# Patient Record
Sex: Female | Born: 1950 | Race: Black or African American | Hispanic: No | Smoking: Never smoker
Health system: Southern US, Community
[De-identification: ages and names within clinical notes are randomized; demographics above are authoritative.]

## PROBLEM LIST (undated history)

## (undated) DIAGNOSIS — N83202 Unspecified ovarian cyst, left side: Secondary | ICD-10-CM

## (undated) DIAGNOSIS — D649 Anemia, unspecified: Secondary | ICD-10-CM

## (undated) DIAGNOSIS — K8301 Primary sclerosing cholangitis: Secondary | ICD-10-CM

## (undated) DIAGNOSIS — N83201 Unspecified ovarian cyst, right side: Secondary | ICD-10-CM

## (undated) DIAGNOSIS — C801 Malignant (primary) neoplasm, unspecified: Secondary | ICD-10-CM

## (undated) DIAGNOSIS — G709 Myoneural disorder, unspecified: Secondary | ICD-10-CM

## (undated) DIAGNOSIS — E785 Hyperlipidemia, unspecified: Secondary | ICD-10-CM

## (undated) DIAGNOSIS — K529 Noninfective gastroenteritis and colitis, unspecified: Secondary | ICD-10-CM

## (undated) DIAGNOSIS — K5090 Crohn's disease, unspecified, without complications: Secondary | ICD-10-CM

## (undated) DIAGNOSIS — I10 Essential (primary) hypertension: Secondary | ICD-10-CM

## (undated) DIAGNOSIS — K51919 Ulcerative colitis, unspecified with unspecified complications: Secondary | ICD-10-CM

## (undated) HISTORY — PX: ERCP W/ SPHINCTEROTOMY AND BALLOON DILATION: SHX1524

## (undated) HISTORY — PX: CHOLECYSTECTOMY: SHX55

## (undated) HISTORY — DX: Unspecified ovarian cyst, right side: N83.202

## (undated) HISTORY — DX: Unspecified ovarian cyst, right side: N83.201

## (undated) HISTORY — PX: ABDOMINAL HYSTERECTOMY: SHX81

## (undated) HISTORY — DX: Noninfective gastroenteritis and colitis, unspecified: K52.9

---

## 2009-12-20 ENCOUNTER — Ambulatory Visit: Payer: Self-pay | Admitting: Internal Medicine

## 2010-01-06 ENCOUNTER — Ambulatory Visit: Payer: Self-pay | Admitting: Internal Medicine

## 2010-01-20 ENCOUNTER — Ambulatory Visit: Payer: Self-pay | Admitting: Internal Medicine

## 2010-02-17 ENCOUNTER — Ambulatory Visit: Payer: Self-pay | Admitting: Internal Medicine

## 2010-04-02 ENCOUNTER — Ambulatory Visit: Payer: Self-pay | Admitting: Internal Medicine

## 2011-08-02 ENCOUNTER — Ambulatory Visit: Payer: Self-pay | Admitting: Internal Medicine

## 2012-08-02 ENCOUNTER — Ambulatory Visit: Payer: Self-pay | Admitting: Internal Medicine

## 2012-08-02 IMAGING — MG MM CAD SCREENING MAMMO
1 series · 4 of 4 positions shown · non-contrast
Comparison: none

REASON FOR EXAM: scr mammo no order
COMMENTS:

[R CC · right · 4 of 4 slices shown]
[im 1/4]
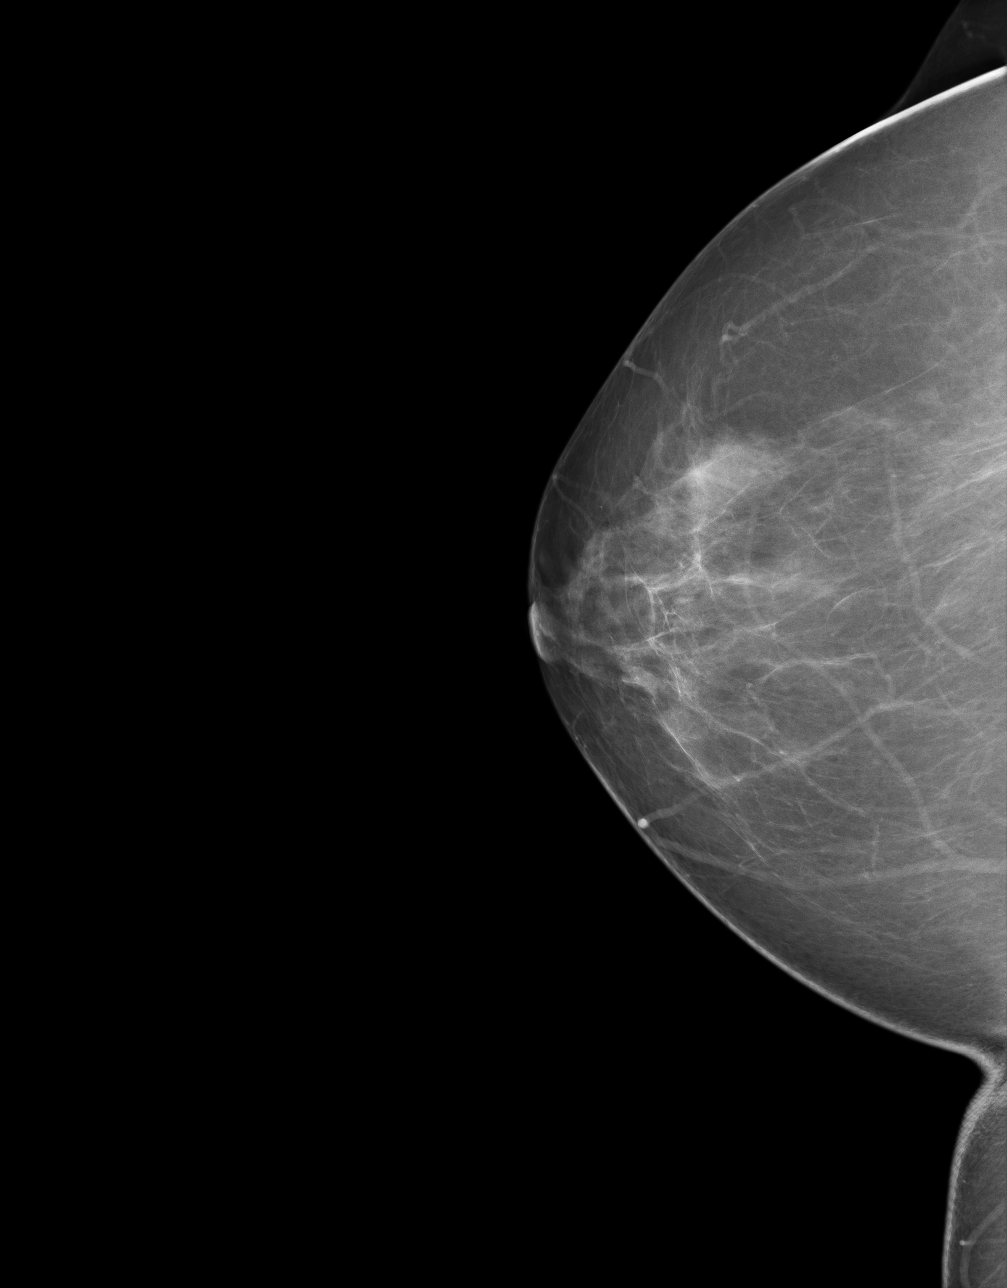
[im 2/4]
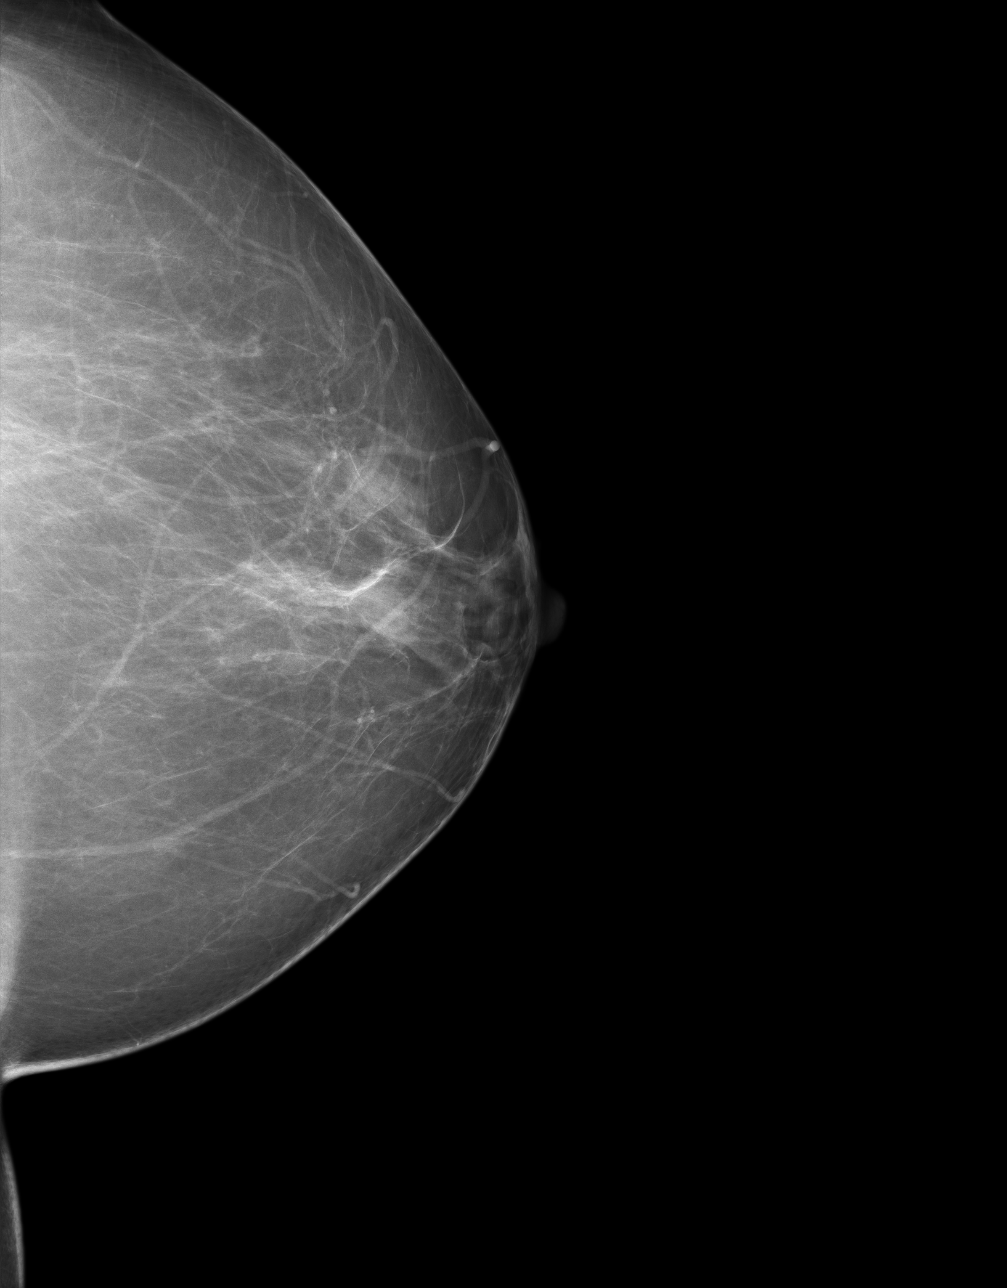
[im 3/4]
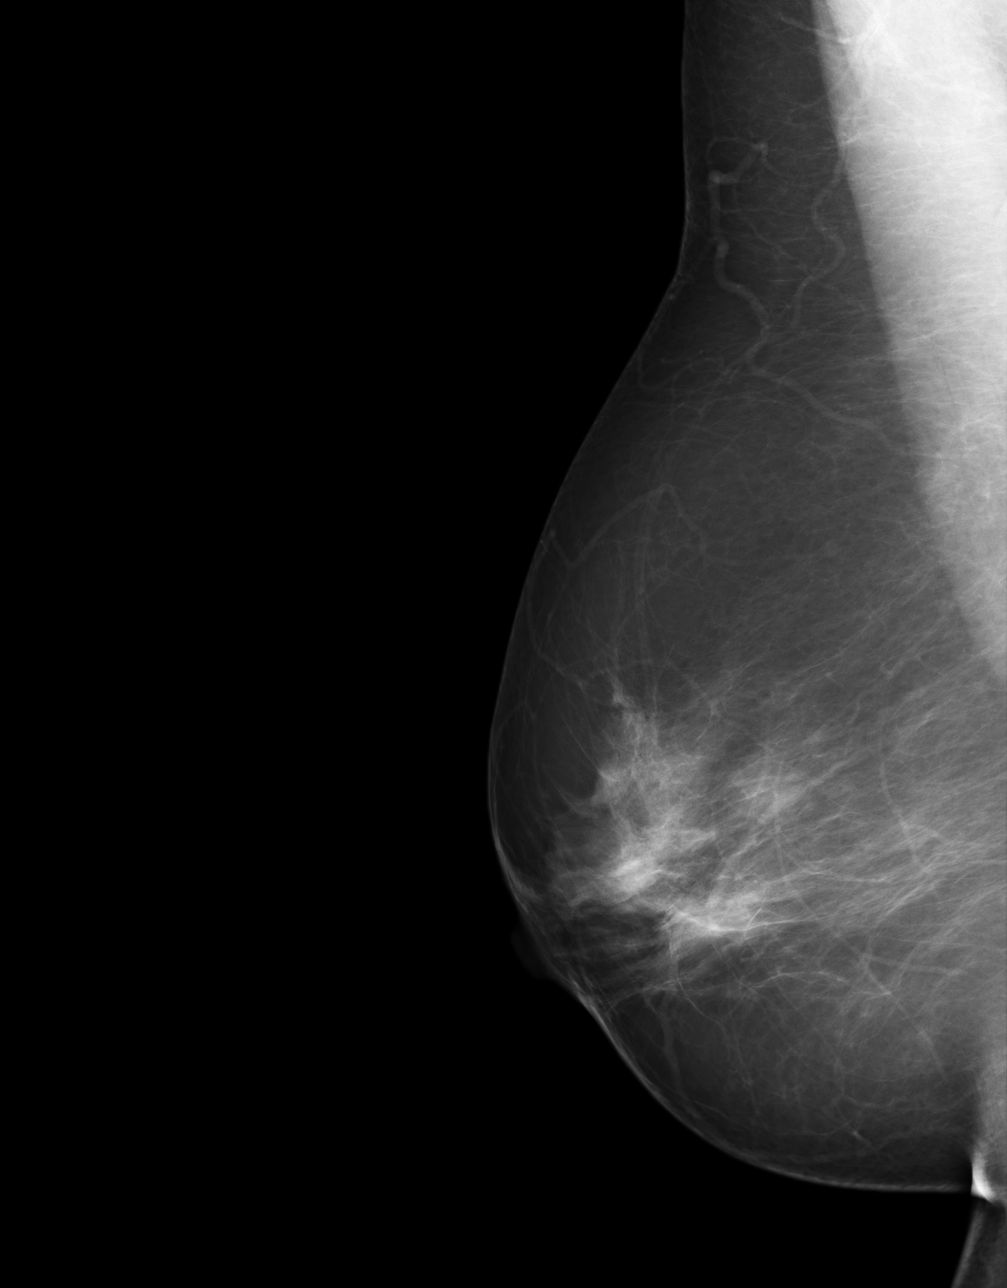
[im 4/4]
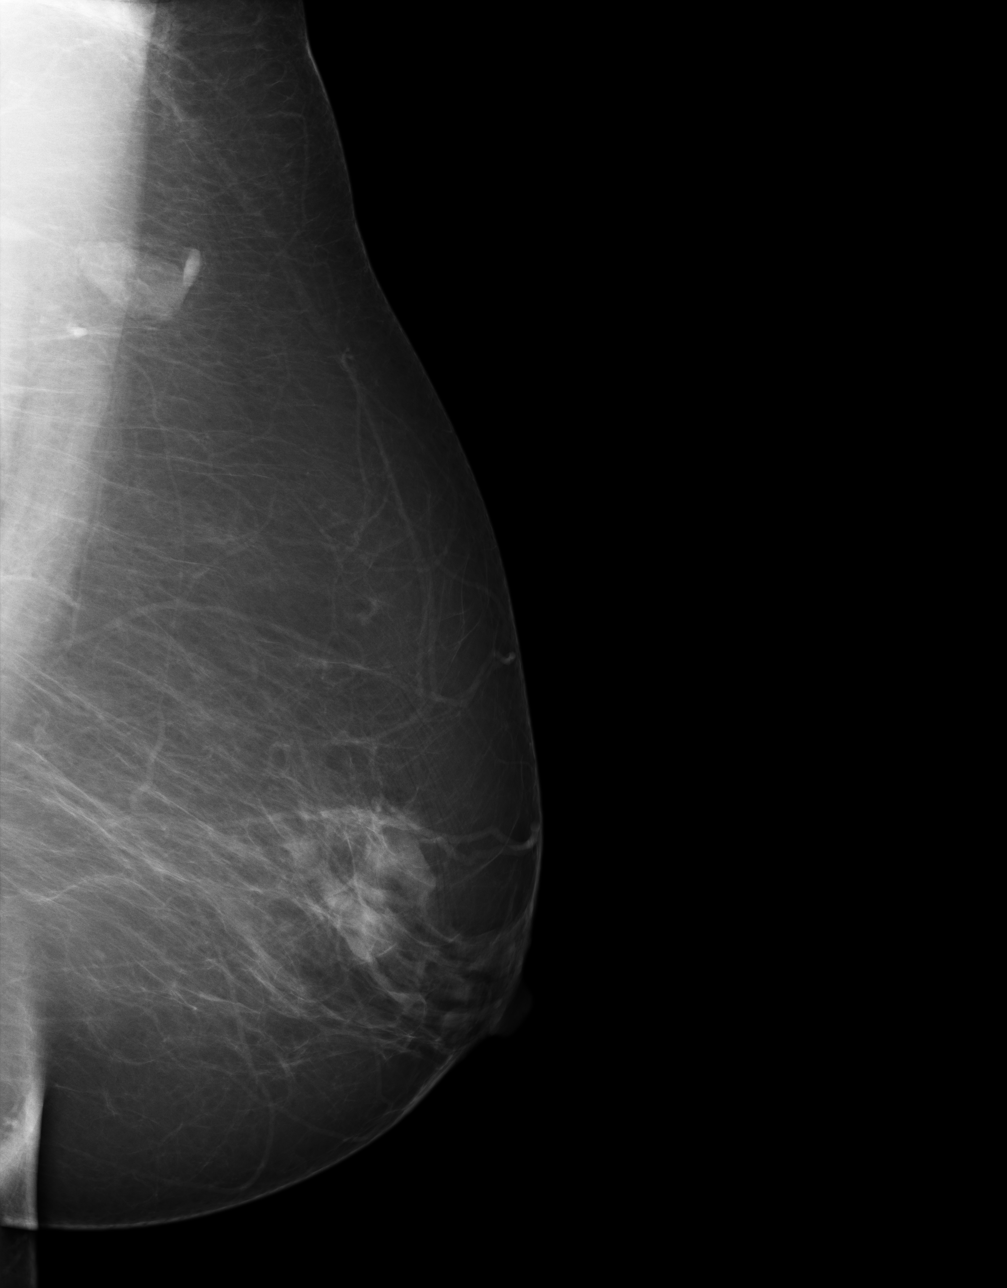

[4 of 4 positions shown; findings below may reference images not displayed]

PROCEDURE:     MAM - MAM DGTL SCRN MAM NO ORDER W/CAD  - [DATE]  [DATE]

RESULT:     There is no known family history of breast cancer. There is no
history of breast surgery. Comparison is made to previous digital
mammographic images [DATE] as well as [DATE].

The breasts exhibit a mild parenchymal density. There is no dominant mass,
developing density or malignant calcification. There is no architectural
distortion. The appearance is stable.
IMPRESSION: 1.     Stable, benign-appearing bilateral mammogram.
2.     BI-RADS:  Category 2- Benign Finding.
3.     Please continue to encourage annual mammographic followup.

Thank you for the opportunity to contribute to the care of your patient.

A negative mammogram report does not preclude biopsy or other evaluation of
a clinically palpable or otherwise suspicious mass or lesion.  Breast cancer
may not be detected by mammography in up to 10% of cases.

[REDACTED]

## 2013-08-03 ENCOUNTER — Ambulatory Visit: Payer: Self-pay | Admitting: Internal Medicine

## 2013-08-03 IMAGING — MG MM CAD SCREENING MAMMO
1 series · 4 of 4 positions shown · non-contrast
Comparison: none

REASON FOR EXAM: SCR MAMMO NO ORDER
COMMENTS:

[R CC · right · 4 of 4 slices shown]
[im 1/4]
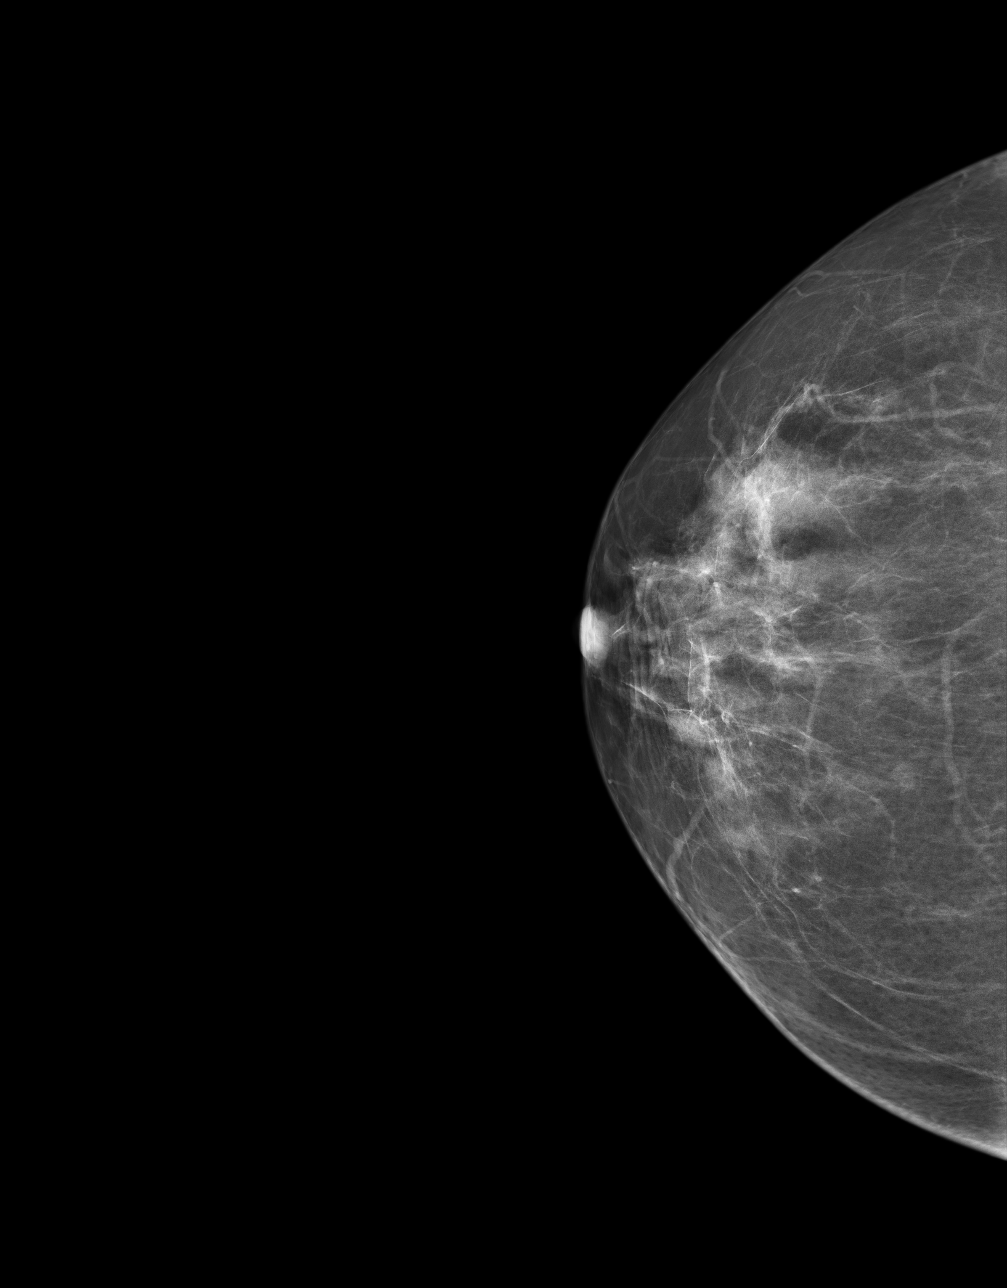
[im 2/4]
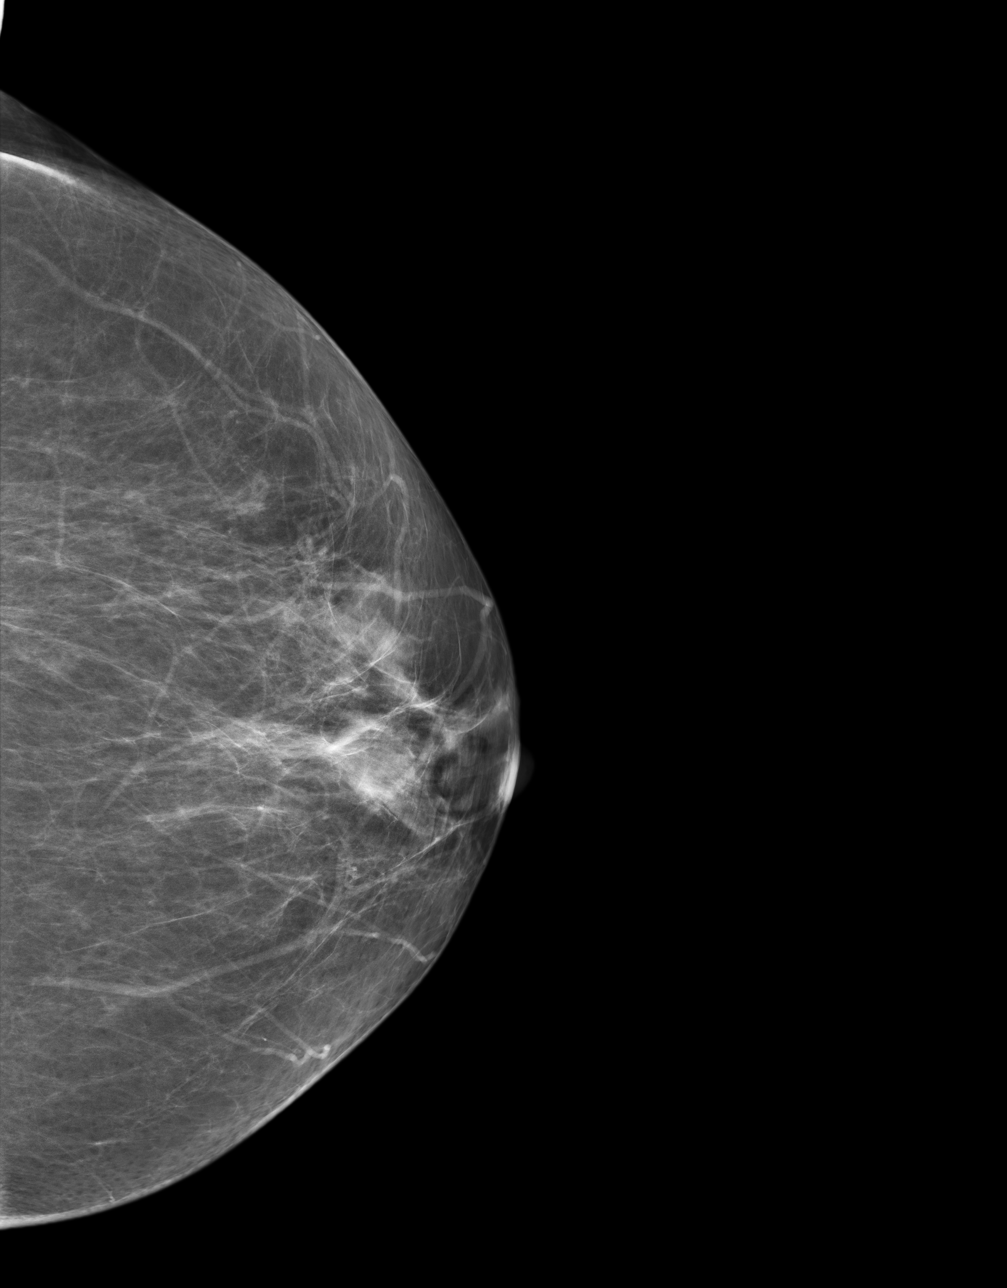
[im 3/4]
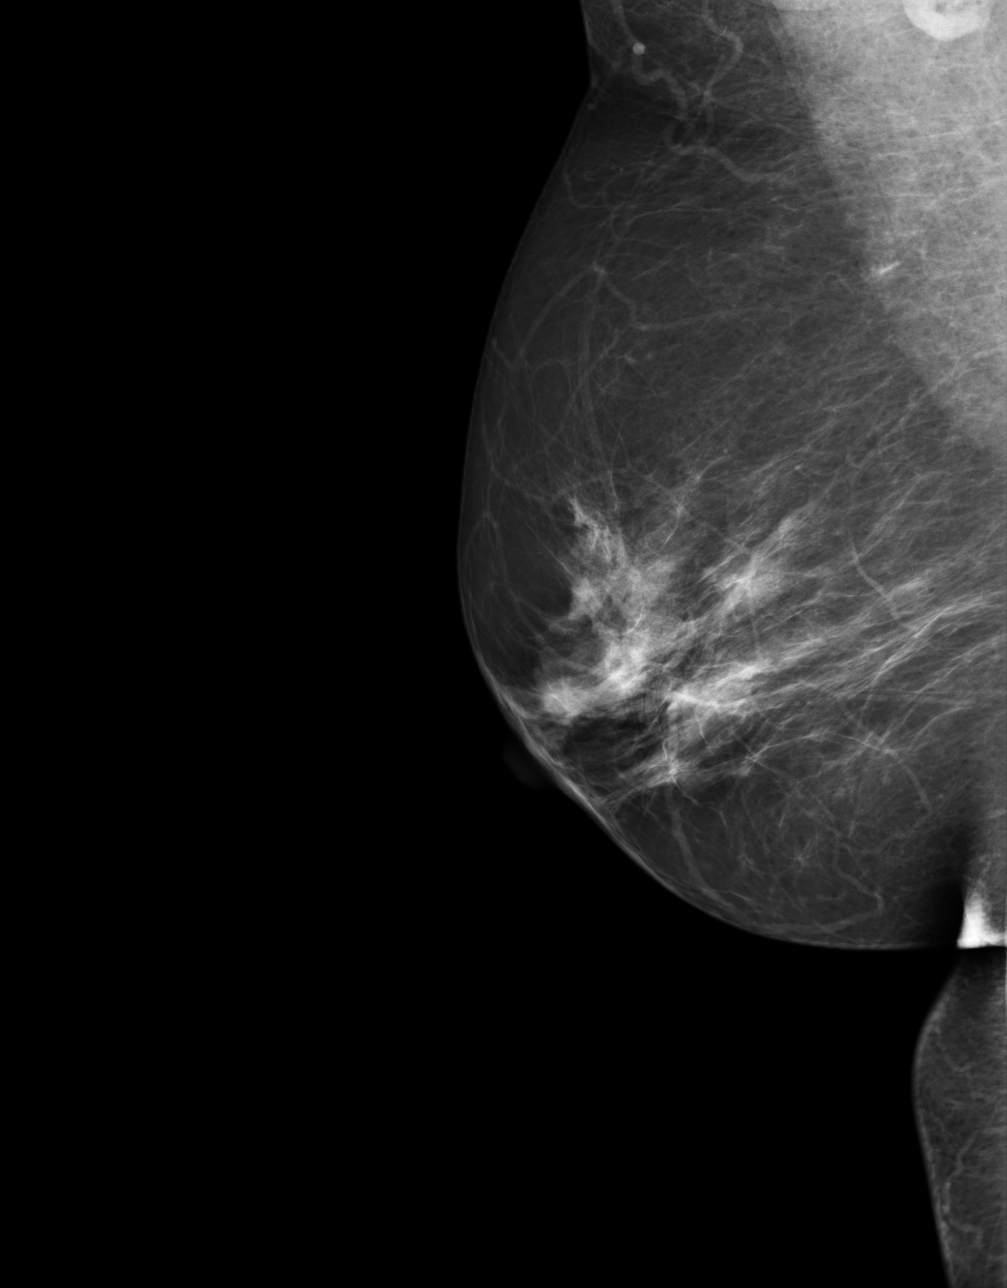
[im 4/4]
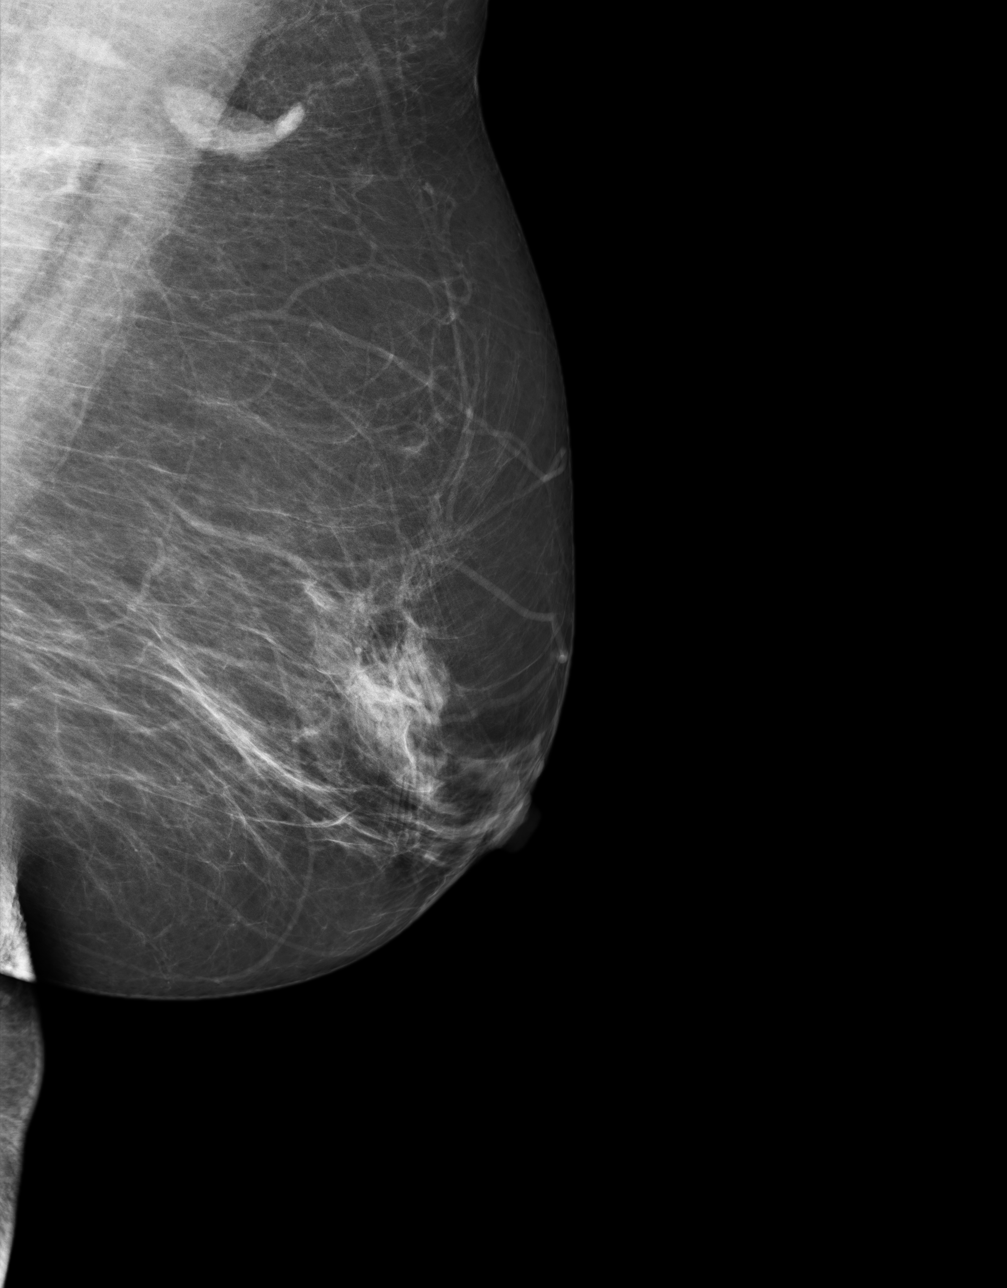

[4 of 4 positions shown; findings below may reference images not displayed]

PROCEDURE:     MAM - MAM DGTL SCRN MAM NO ORDER W/CAD  - [DATE]  [DATE]

RESULT:     Comparison is made to previous digital studies [DATE],[DATE], and [DATE].

The breasts exhibit a scattered fibroglandular pattern with evidence of
ongoing involution. Benign-appearing lymph nodes are present in the axillary
regions. There is no dominant mass. There are no malignant appearing
groupings of microcalcification. There are no areas of new architectural
distortion.
IMPRESSION: There are no findings suspicious for malignancy.

BI-RADS 2: Benign findings.

Recommendation: Please continue to encourage yearly mammographic followup.

BREAST COMPOSITION: The breast composition is SCATTERED FIBROGLANDULAR
TISSUE (glandular tissue is 25-50%)

A NEGATIVE MAMMOGRAM REPORT DOES NOT PRECLUDE BIOPSY OR OTHER EVALUATION OF
A CLINICALLY PALPABLE OR OTHERWISE SUSPICIOUS MASS OR LESION. BREAST CANCER
MAY NOT BE DETECTED BY MAMMOGRAPHY IN UP TO 10% OF CASES.

Dictation site:1

## 2014-08-01 ENCOUNTER — Ambulatory Visit: Payer: Self-pay | Admitting: Internal Medicine

## 2014-08-01 IMAGING — MG MM DIGITAL SCREENING BILAT W/ CAD
1 series · 4 of 4 positions shown · non-contrast
Comparison: Previous exam(s).

CLINICAL DATA: Screening.

EXAM:
DIGITAL SCREENING BILATERAL MAMMOGRAM WITH CAD

[R CC · right · 4 of 4 slices shown]
[im 1/4]
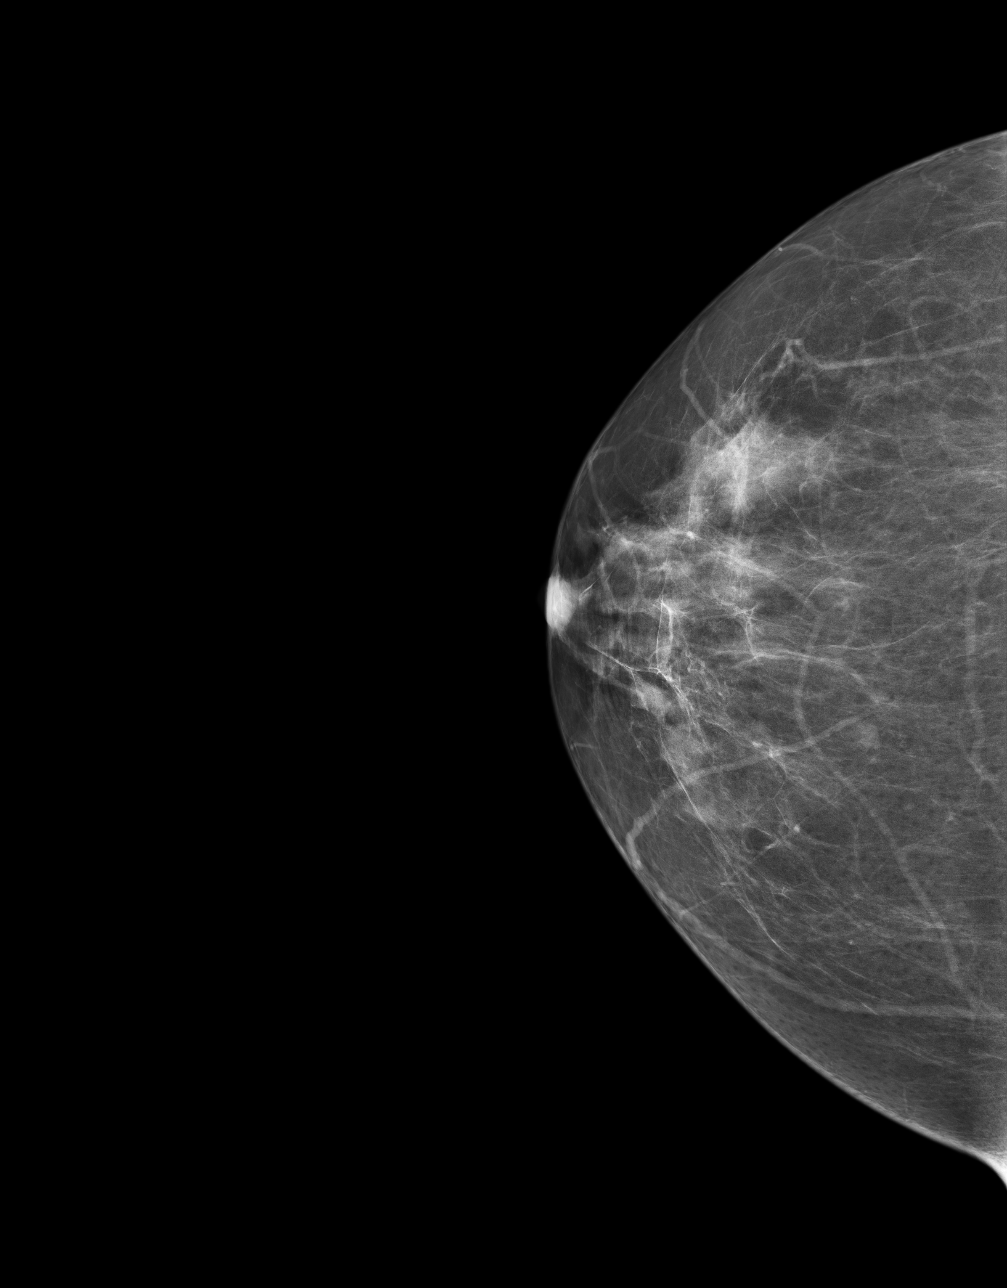
[im 2/4]
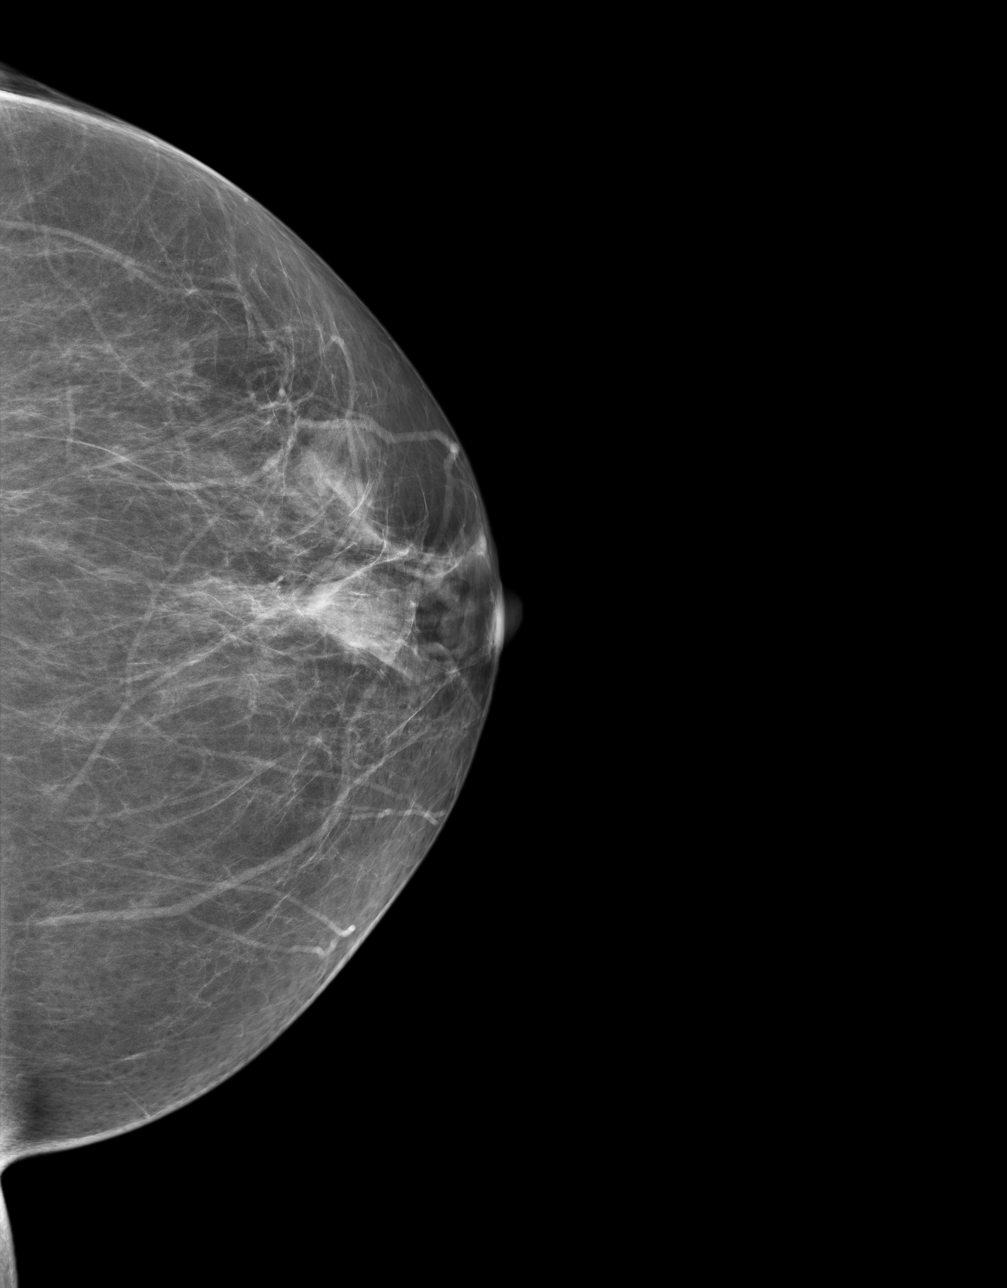
[im 3/4]
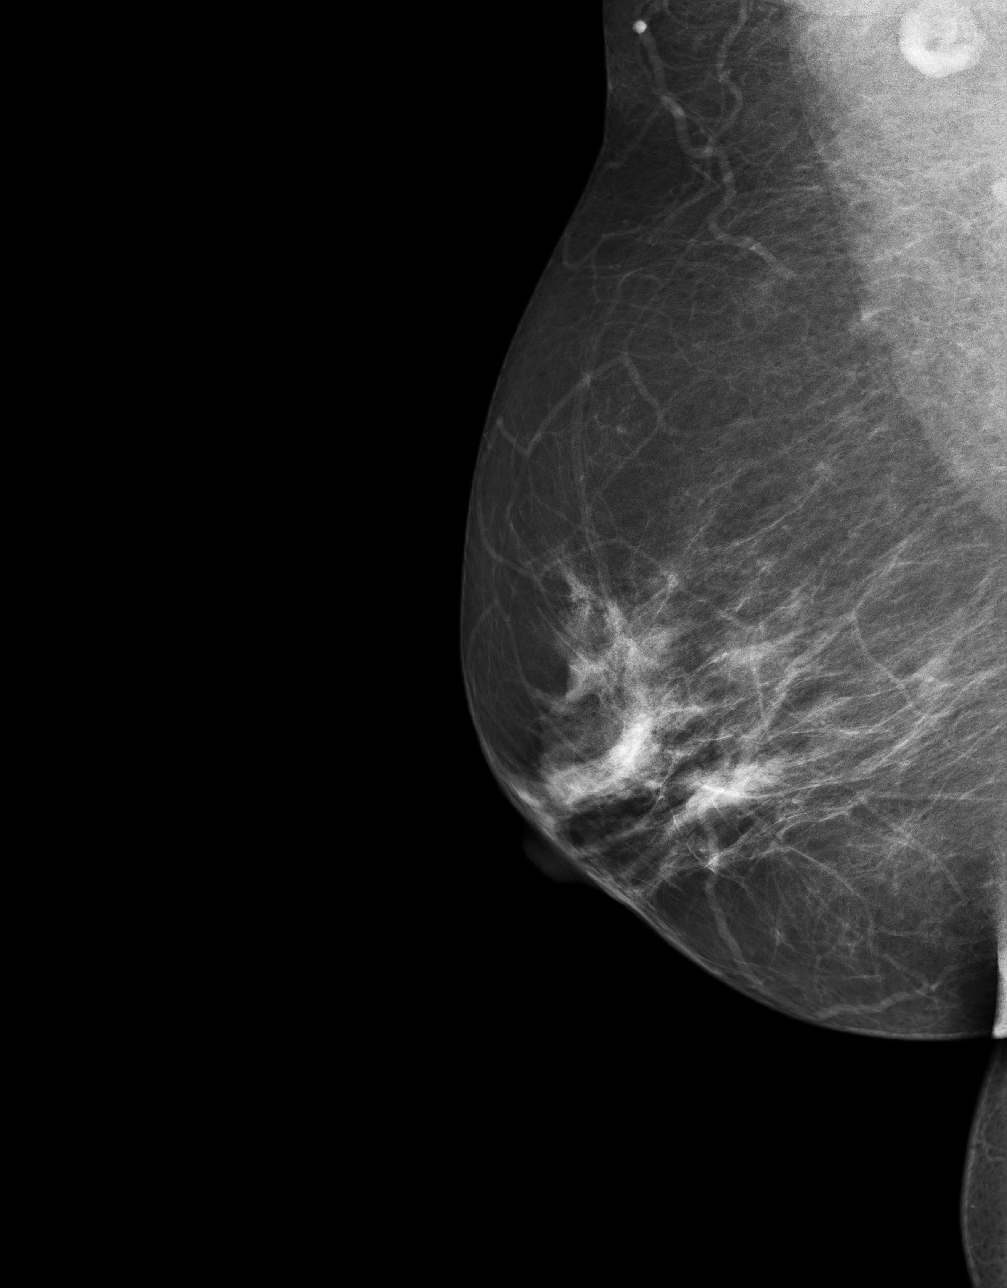
[im 4/4]
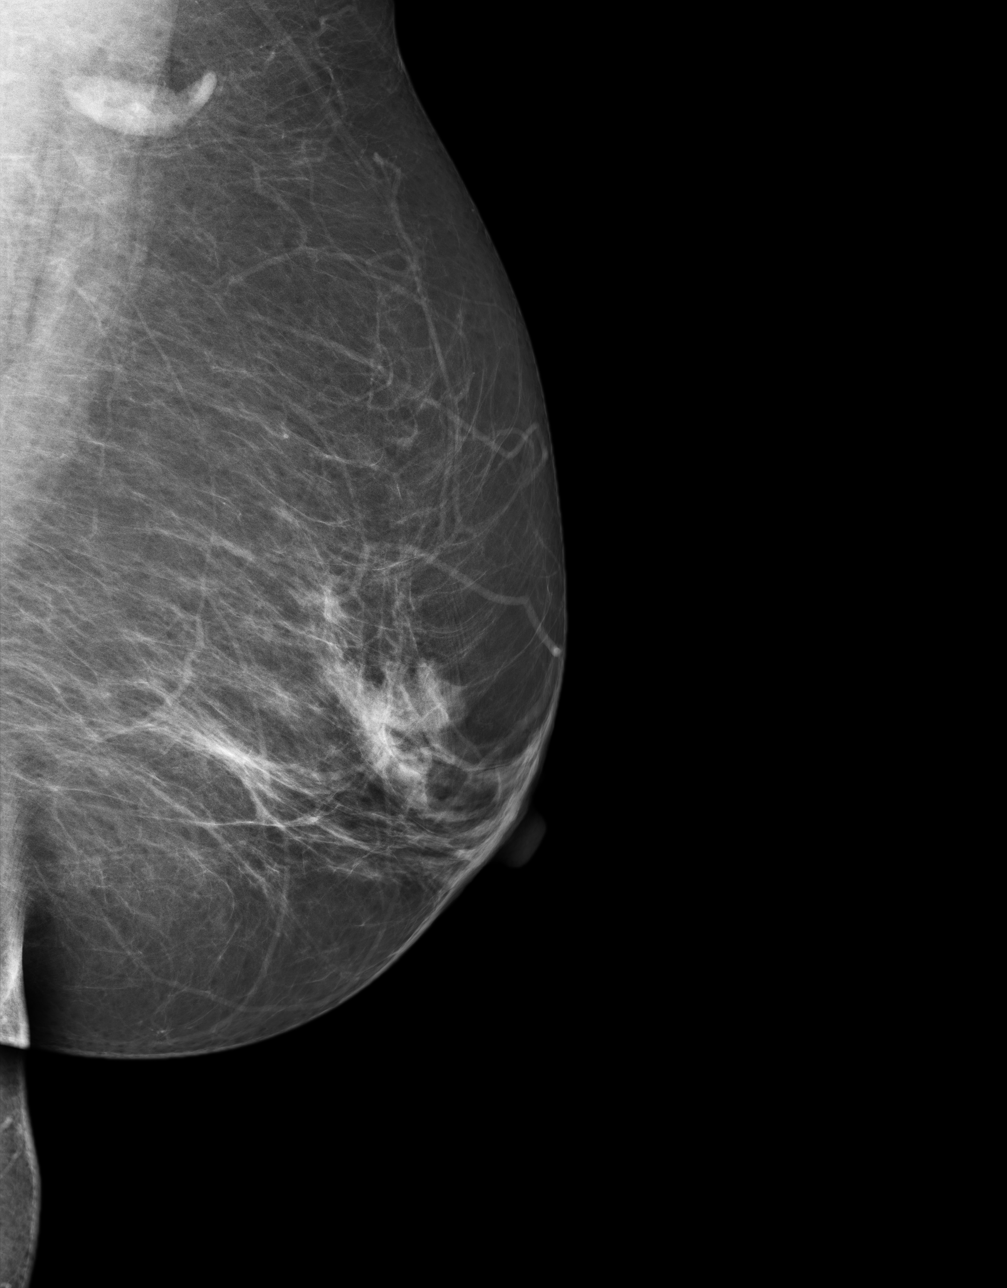

[4 of 4 positions shown; findings below may reference images not displayed]

ACR Breast Density Category b: There are scattered areas of
fibroglandular density.
FINDINGS: There are no findings suspicious for malignancy. Images were
processed with CAD.
IMPRESSION: No mammographic evidence of malignancy. A result letter of this
screening mammogram will be mailed directly to the patient.

RECOMMENDATION:
Screening mammogram in one year. (Code:[US])

BI-RADS CATEGORY  1: Negative.

## 2016-05-27 ENCOUNTER — Other Ambulatory Visit: Payer: Self-pay | Admitting: Internal Medicine

## 2016-05-27 DIAGNOSIS — Z1231 Encounter for screening mammogram for malignant neoplasm of breast: Secondary | ICD-10-CM

## 2016-06-11 ENCOUNTER — Other Ambulatory Visit: Payer: Self-pay | Admitting: Internal Medicine

## 2016-06-11 ENCOUNTER — Ambulatory Visit
Admission: RE | Admit: 2016-06-11 | Discharge: 2016-06-11 | Disposition: A | Discharge status: Home / Self Care | Payer: BLUE CROSS/BLUE SHIELD | Source: Ambulatory Visit | Attending: Internal Medicine | Admitting: Internal Medicine

## 2016-06-11 DIAGNOSIS — Z1231 Encounter for screening mammogram for malignant neoplasm of breast: Secondary | ICD-10-CM | POA: Diagnosis not present

## 2016-06-11 IMAGING — MG MM DIGITAL SCREENING BILAT W/ TOMO W/ CAD
8 of 13 series · 8 of 29 positions shown · non-contrast
Comparison: Previous exam(s).

CLINICAL DATA: Screening.

EXAM:
2D DIGITAL SCREENING BILATERAL MAMMOGRAM WITH CAD AND ADJUNCT TOMO

[L MLO (1 of 2)]
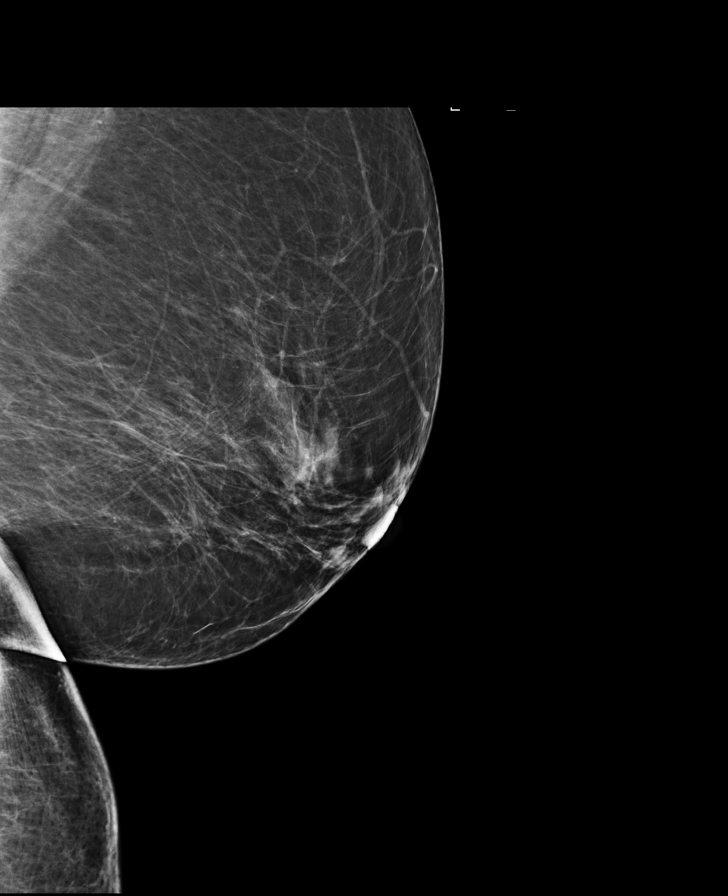

[L CC]
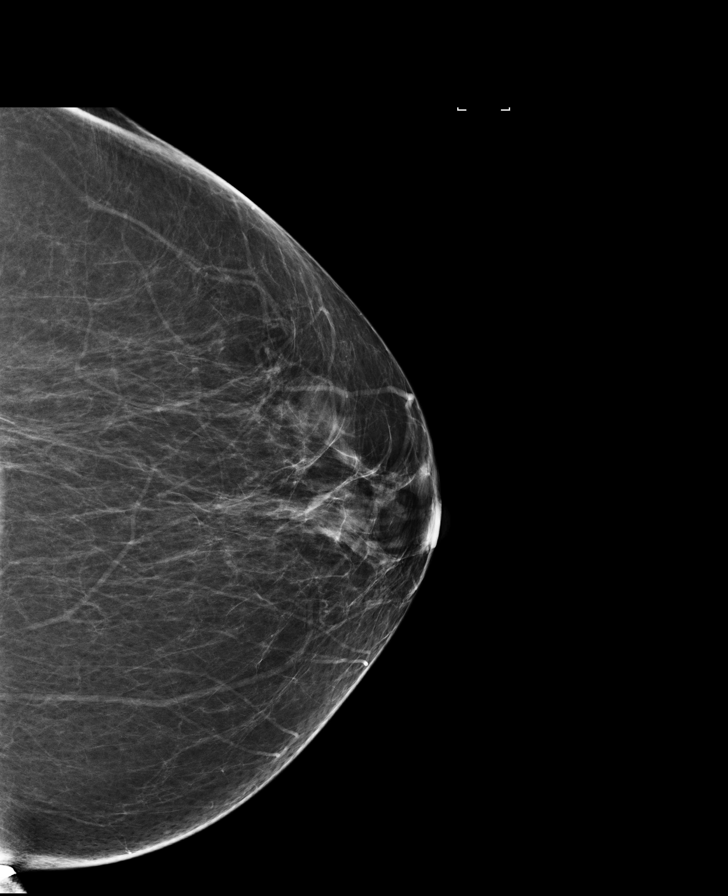

[L CC synth-2D]
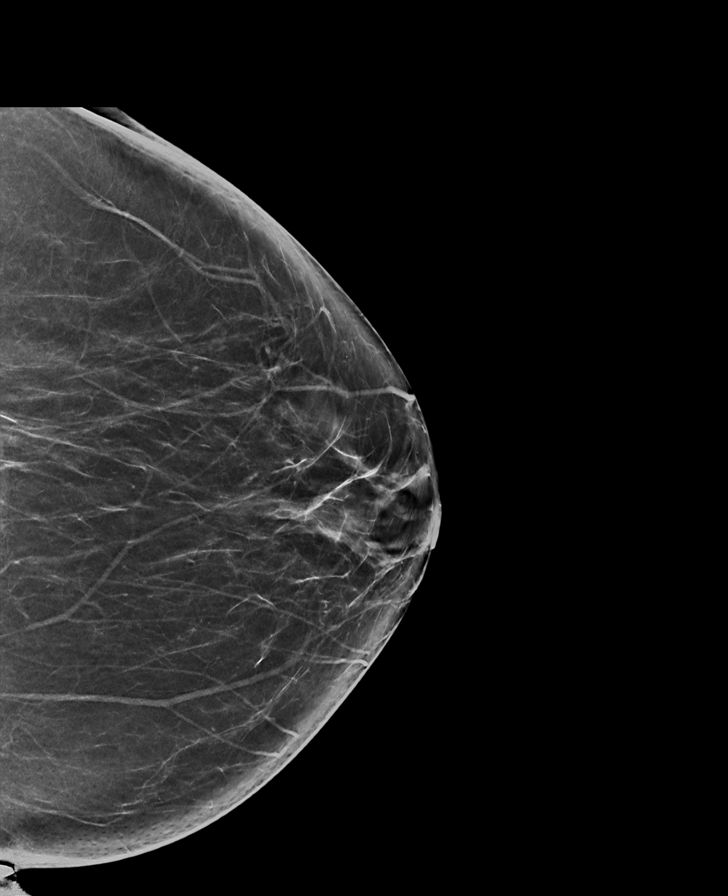

[R CC]
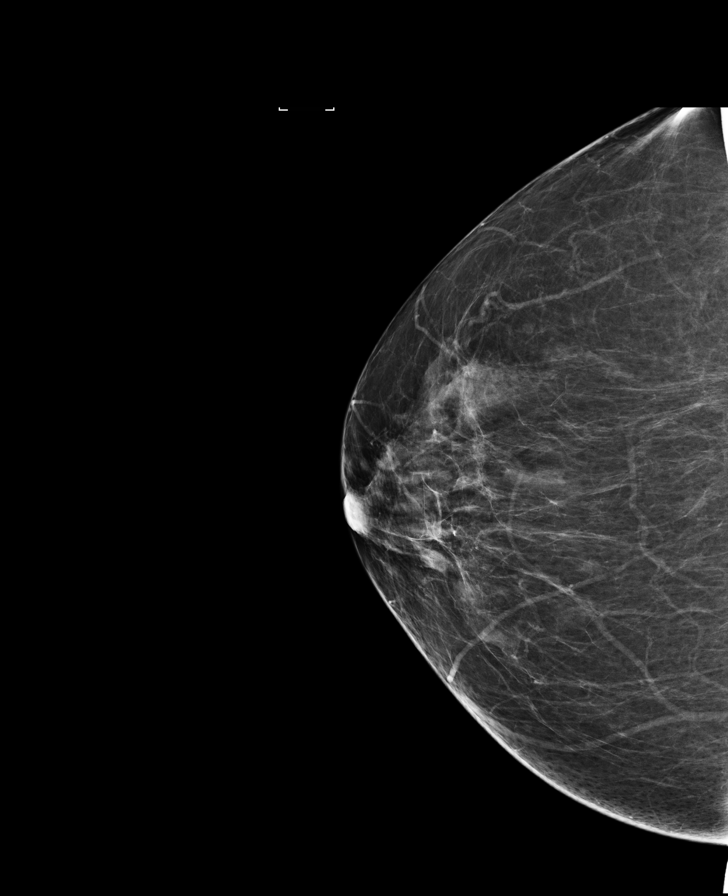

[L MLO (2 of 2)]
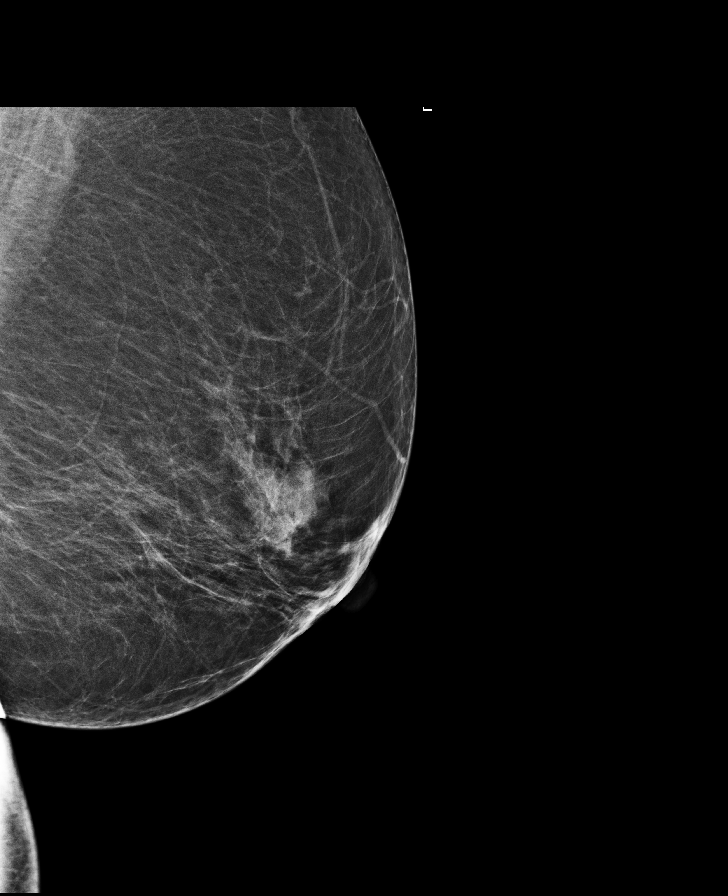

[L MLO synth-2D]
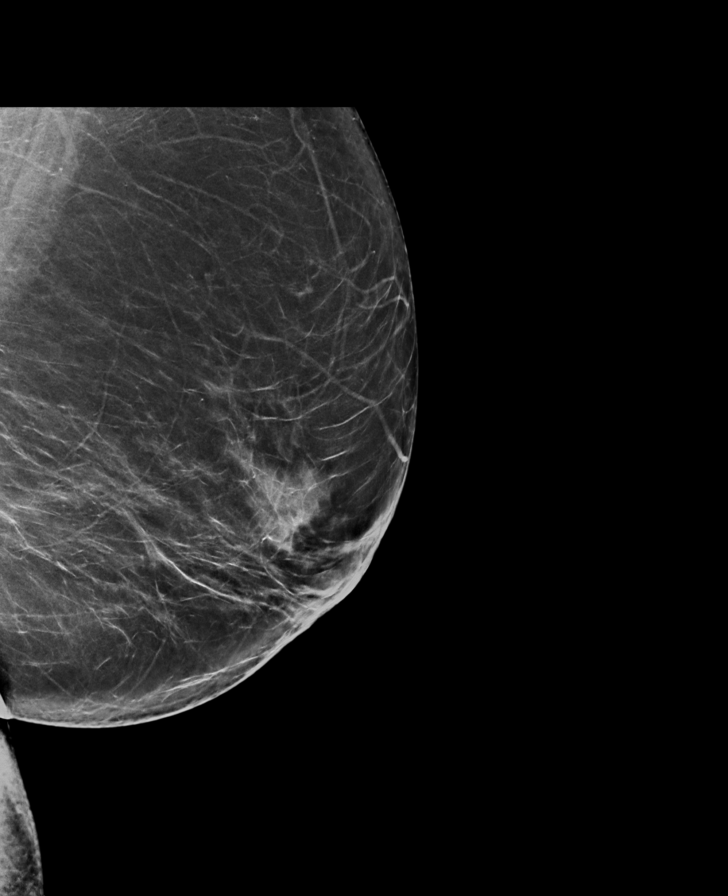

[R MLO synth-2D]
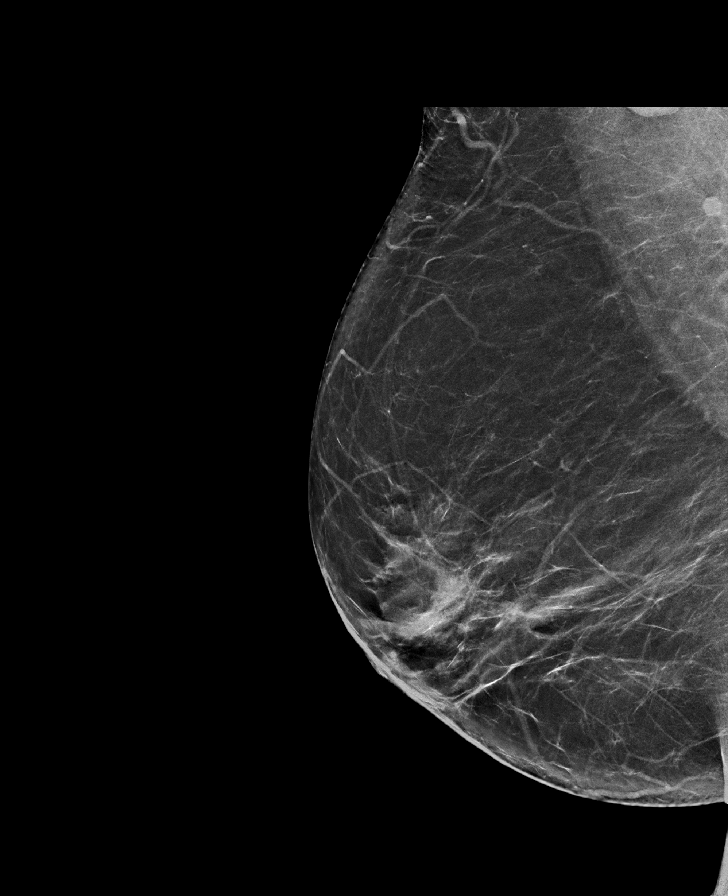

[R CC synth-2D]
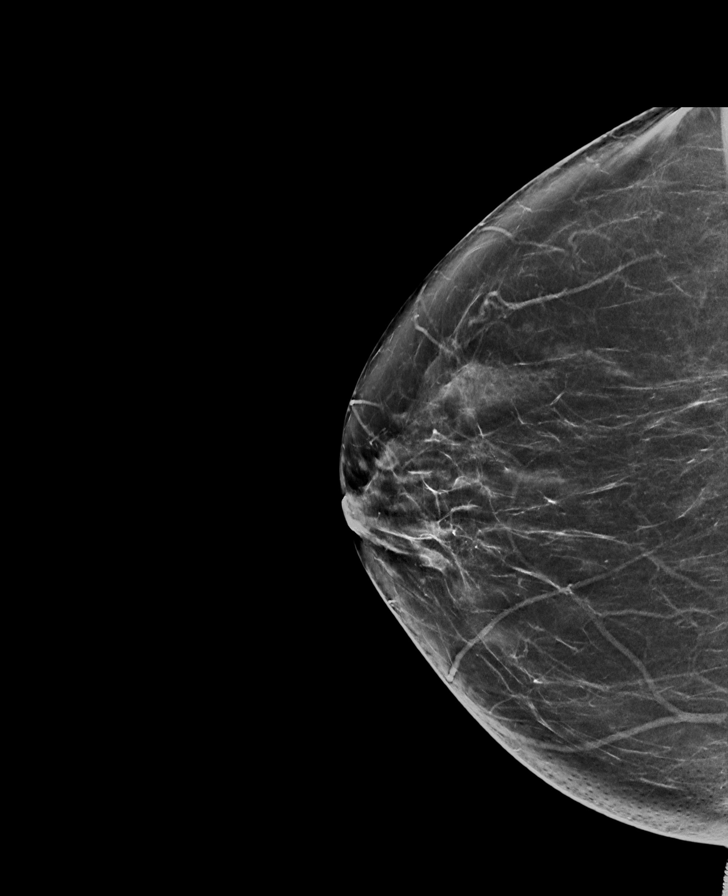

[8 of 29 positions shown; findings below may reference images not displayed]

ACR Breast Density Category b: There are scattered areas of
fibroglandular density.
FINDINGS: There are no findings suspicious for malignancy. Images were
processed with CAD.
IMPRESSION: No mammographic evidence of malignancy. A result letter of this
screening mammogram will be mailed directly to the patient.

RECOMMENDATION:
Screening mammogram in one year. (Code:[33])

BI-RADS CATEGORY  1: Negative.

## 2016-11-19 DIAGNOSIS — Z0000 Encounter for general adult medical examination without abnormal findings: Secondary | ICD-10-CM | POA: Diagnosis not present

## 2016-11-26 DIAGNOSIS — I10 Essential (primary) hypertension: Secondary | ICD-10-CM | POA: Diagnosis not present

## 2016-11-26 DIAGNOSIS — E7800 Pure hypercholesterolemia, unspecified: Secondary | ICD-10-CM | POA: Diagnosis not present

## 2016-11-26 DIAGNOSIS — R7309 Other abnormal glucose: Secondary | ICD-10-CM | POA: Diagnosis not present

## 2016-11-26 DIAGNOSIS — Z0000 Encounter for general adult medical examination without abnormal findings: Secondary | ICD-10-CM | POA: Diagnosis not present

## 2017-01-31 DIAGNOSIS — B079 Viral wart, unspecified: Secondary | ICD-10-CM | POA: Diagnosis not present

## 2017-01-31 DIAGNOSIS — D485 Neoplasm of uncertain behavior of skin: Secondary | ICD-10-CM | POA: Diagnosis not present

## 2017-02-16 DIAGNOSIS — R531 Weakness: Secondary | ICD-10-CM | POA: Diagnosis not present

## 2017-02-16 DIAGNOSIS — I10 Essential (primary) hypertension: Secondary | ICD-10-CM | POA: Diagnosis not present

## 2017-02-16 DIAGNOSIS — E7800 Pure hypercholesterolemia, unspecified: Secondary | ICD-10-CM | POA: Diagnosis not present

## 2017-02-16 DIAGNOSIS — R197 Diarrhea, unspecified: Secondary | ICD-10-CM | POA: Diagnosis not present

## 2017-02-17 DIAGNOSIS — R197 Diarrhea, unspecified: Secondary | ICD-10-CM | POA: Diagnosis not present

## 2017-02-17 DIAGNOSIS — R531 Weakness: Secondary | ICD-10-CM | POA: Diagnosis not present

## 2017-03-16 DIAGNOSIS — R739 Hyperglycemia, unspecified: Secondary | ICD-10-CM | POA: Diagnosis not present

## 2017-03-16 DIAGNOSIS — I10 Essential (primary) hypertension: Secondary | ICD-10-CM | POA: Diagnosis not present

## 2017-03-16 DIAGNOSIS — R197 Diarrhea, unspecified: Secondary | ICD-10-CM | POA: Diagnosis not present

## 2017-04-12 DIAGNOSIS — R194 Change in bowel habit: Secondary | ICD-10-CM | POA: Diagnosis not present

## 2017-04-12 DIAGNOSIS — K529 Noninfective gastroenteritis and colitis, unspecified: Secondary | ICD-10-CM | POA: Diagnosis not present

## 2017-04-12 DIAGNOSIS — R634 Abnormal weight loss: Secondary | ICD-10-CM | POA: Diagnosis not present

## 2017-04-13 ENCOUNTER — Other Ambulatory Visit
Admission: RE | Admit: 2017-04-13 | Discharge: 2017-04-13 | Disposition: A | Discharge status: Home / Self Care | Payer: PPO | Source: Ambulatory Visit | Attending: Internal Medicine | Admitting: Internal Medicine

## 2017-04-13 DIAGNOSIS — R194 Change in bowel habit: Secondary | ICD-10-CM | POA: Diagnosis not present

## 2017-04-13 LAB — C DIFFICILE QUICK SCREEN W PCR REFLEX
C Diff antigen: POSITIVE — AB
C Diff toxin: NEGATIVE

## 2017-04-13 LAB — CLOSTRIDIUM DIFFICILE BY PCR: Toxigenic C. Difficile by PCR: POSITIVE — AB

## 2017-04-15 DIAGNOSIS — R194 Change in bowel habit: Secondary | ICD-10-CM | POA: Diagnosis not present

## 2017-04-15 DIAGNOSIS — K529 Noninfective gastroenteritis and colitis, unspecified: Secondary | ICD-10-CM | POA: Diagnosis not present

## 2017-04-15 DIAGNOSIS — R634 Abnormal weight loss: Secondary | ICD-10-CM | POA: Diagnosis not present

## 2017-05-11 ENCOUNTER — Other Ambulatory Visit: Payer: Self-pay | Admitting: Internal Medicine

## 2017-05-11 DIAGNOSIS — Z1231 Encounter for screening mammogram for malignant neoplasm of breast: Secondary | ICD-10-CM

## 2017-05-20 DIAGNOSIS — R7309 Other abnormal glucose: Secondary | ICD-10-CM | POA: Diagnosis not present

## 2017-05-20 DIAGNOSIS — I10 Essential (primary) hypertension: Secondary | ICD-10-CM | POA: Diagnosis not present

## 2017-05-20 DIAGNOSIS — Z0000 Encounter for general adult medical examination without abnormal findings: Secondary | ICD-10-CM | POA: Diagnosis not present

## 2017-05-20 DIAGNOSIS — E7800 Pure hypercholesterolemia, unspecified: Secondary | ICD-10-CM | POA: Diagnosis not present

## 2017-05-27 DIAGNOSIS — I10 Essential (primary) hypertension: Secondary | ICD-10-CM | POA: Diagnosis not present

## 2017-05-27 DIAGNOSIS — E7800 Pure hypercholesterolemia, unspecified: Secondary | ICD-10-CM | POA: Diagnosis not present

## 2017-05-27 DIAGNOSIS — R748 Abnormal levels of other serum enzymes: Secondary | ICD-10-CM | POA: Diagnosis not present

## 2017-05-27 DIAGNOSIS — Z0000 Encounter for general adult medical examination without abnormal findings: Secondary | ICD-10-CM | POA: Diagnosis not present

## 2017-05-27 DIAGNOSIS — R197 Diarrhea, unspecified: Secondary | ICD-10-CM | POA: Diagnosis not present

## 2017-05-31 ENCOUNTER — Other Ambulatory Visit: Payer: Self-pay | Admitting: Internal Medicine

## 2017-05-31 DIAGNOSIS — R197 Diarrhea, unspecified: Secondary | ICD-10-CM

## 2017-06-07 ENCOUNTER — Encounter: Payer: Self-pay | Admitting: Radiology

## 2017-06-07 ENCOUNTER — Ambulatory Visit
Admission: RE | Admit: 2017-06-07 | Discharge: 2017-06-07 | Disposition: A | Discharge status: Home / Self Care | Payer: PPO | Source: Ambulatory Visit | Attending: Internal Medicine | Admitting: Internal Medicine

## 2017-06-07 DIAGNOSIS — K769 Liver disease, unspecified: Secondary | ICD-10-CM | POA: Diagnosis not present

## 2017-06-07 DIAGNOSIS — R197 Diarrhea, unspecified: Secondary | ICD-10-CM | POA: Diagnosis not present

## 2017-06-07 DIAGNOSIS — D3502 Benign neoplasm of left adrenal gland: Secondary | ICD-10-CM | POA: Diagnosis not present

## 2017-06-07 DIAGNOSIS — N83201 Unspecified ovarian cyst, right side: Secondary | ICD-10-CM | POA: Diagnosis not present

## 2017-06-07 DIAGNOSIS — R748 Abnormal levels of other serum enzymes: Secondary | ICD-10-CM | POA: Insufficient documentation

## 2017-06-07 DIAGNOSIS — I700 Atherosclerosis of aorta: Secondary | ICD-10-CM | POA: Insufficient documentation

## 2017-06-07 HISTORY — DX: Essential (primary) hypertension: I10

## 2017-06-07 IMAGING — CT CT ABD-PELV W/ CM
1 of 3 series · 13 of 32 positions shown, 18 images · IV contrast (APPLIED)
Comparison: None.

CLINICAL DATA: 65-year-old female with diarrhea for 6 months. No
history of cancer. Initial encounter.

EXAM:
CT ABDOMEN AND PELVIS WITH CONTRAST
TECHNIQUE: Multidetector CT imaging of the abdomen and pelvis was performed
using the standard protocol following bolus administration of
intravenous contrast.
CONTRAST:  100mL [PN] IOPAMIDOL ([PN]) INJECTION 61%

[Series 2: axial st · axial · 0.82mm/px · z∈[-436,-36]mm · 13 of 91 slices shown, 18 images]
[im 6/91  soft-tissue]
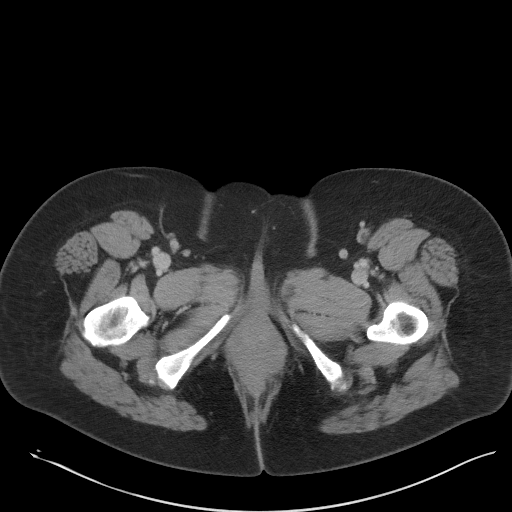
[im 6/91  bone]
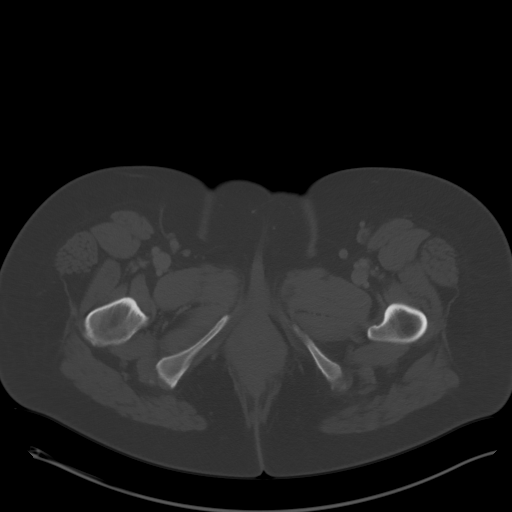
[im 16/91  soft-tissue]
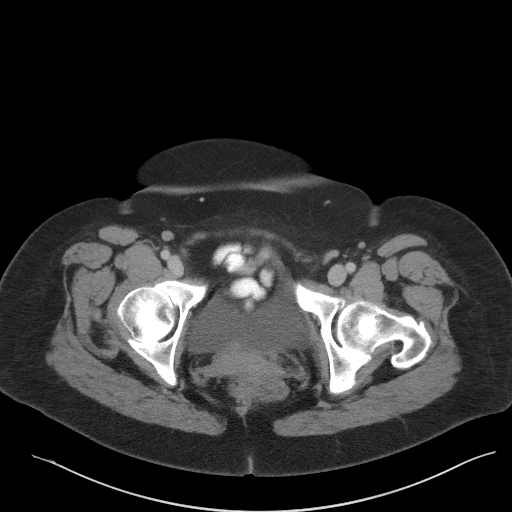
[im 21/91  soft-tissue]
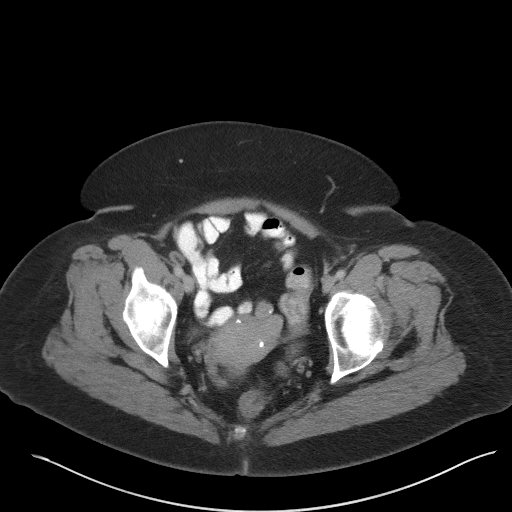
[im 26/91  soft-tissue]
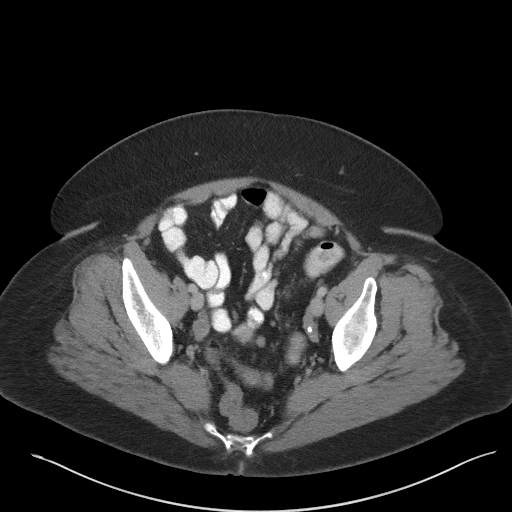
[im 36/91  soft-tissue]
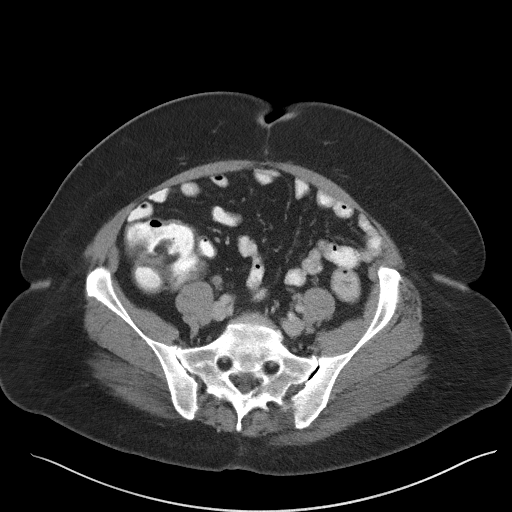
[im 41/91  soft-tissue]
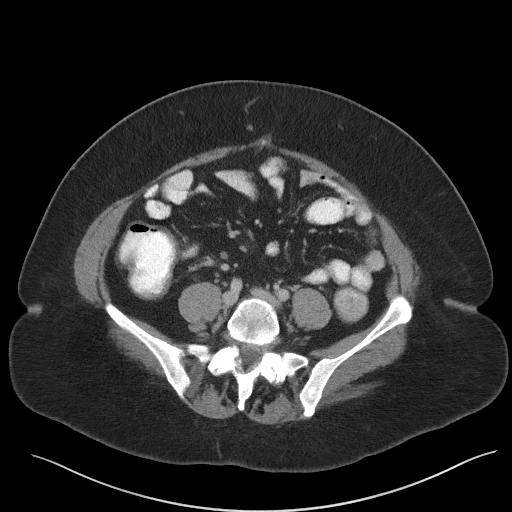
[im 51/91  soft-tissue]
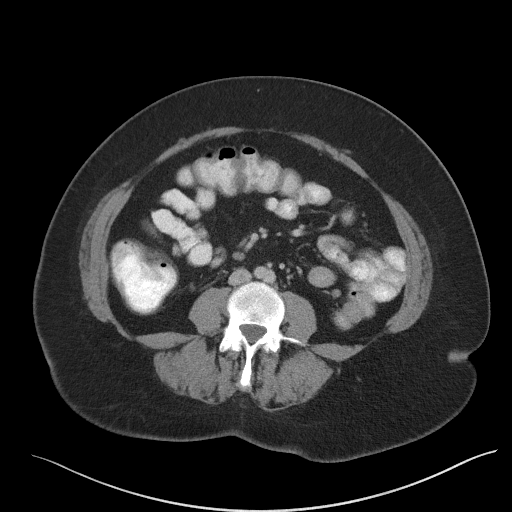
[im 56/91  soft-tissue]
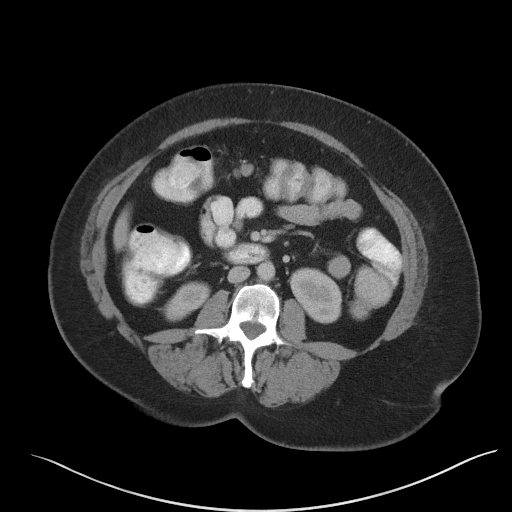
[im 66/91  soft-tissue]
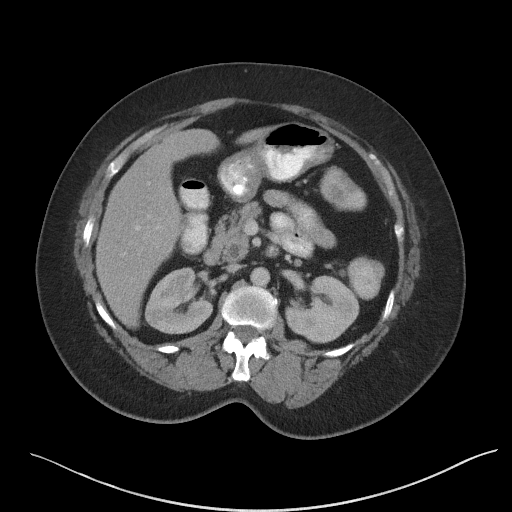
[im 66/91  bone]
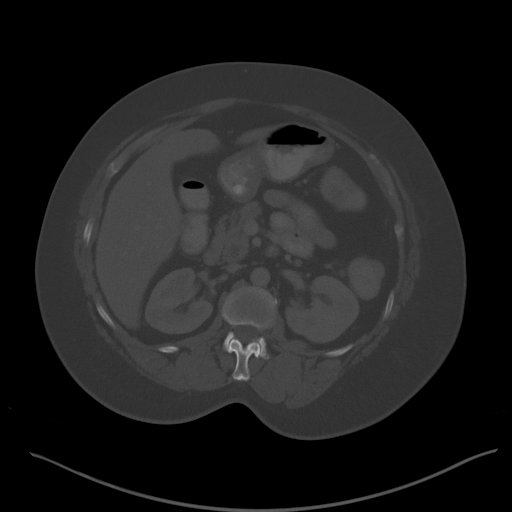
[im 71/91  soft-tissue]
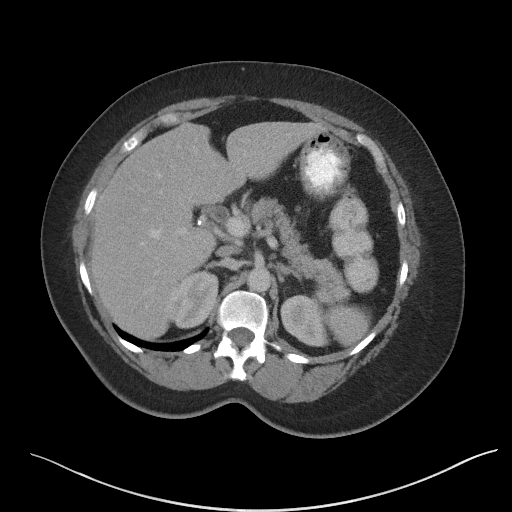
[im 71/91  lung]
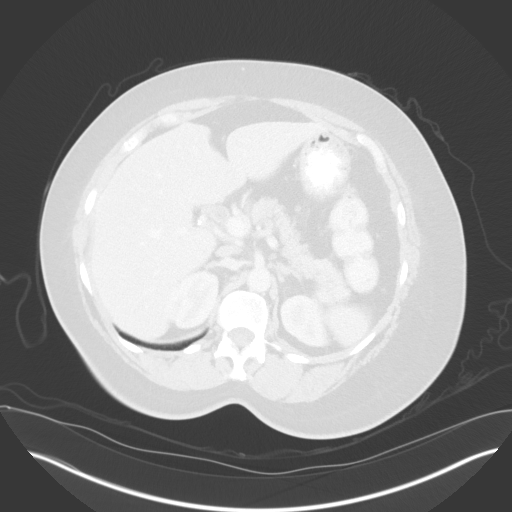
[im 76/91  soft-tissue]
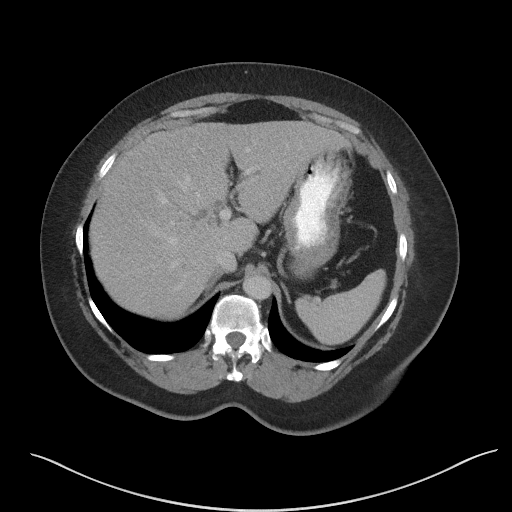
[im 76/91  lung]
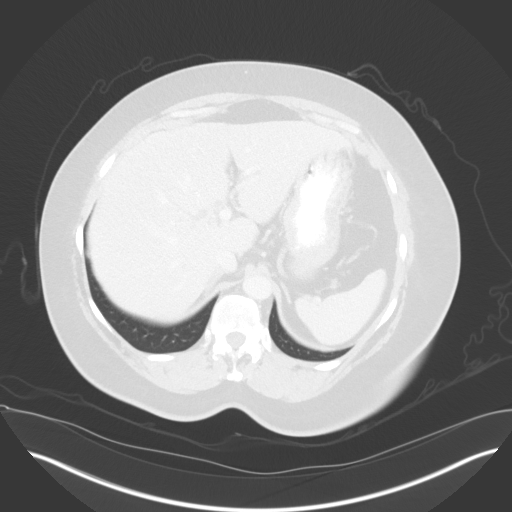
[im 81/91  lung]
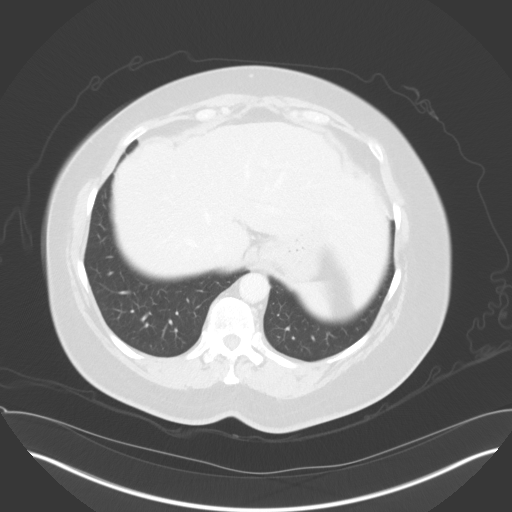
[im 86/91  soft-tissue]
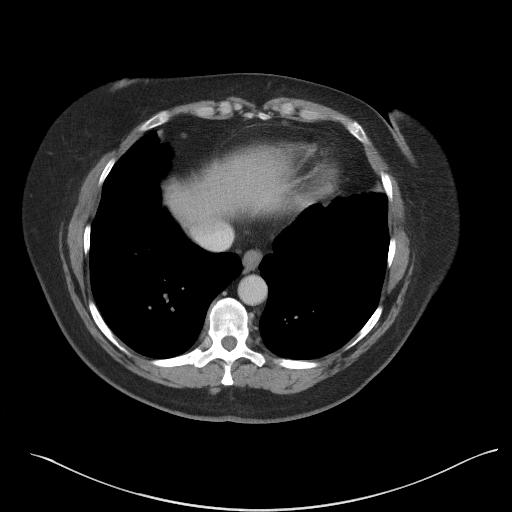
[im 86/91  lung]
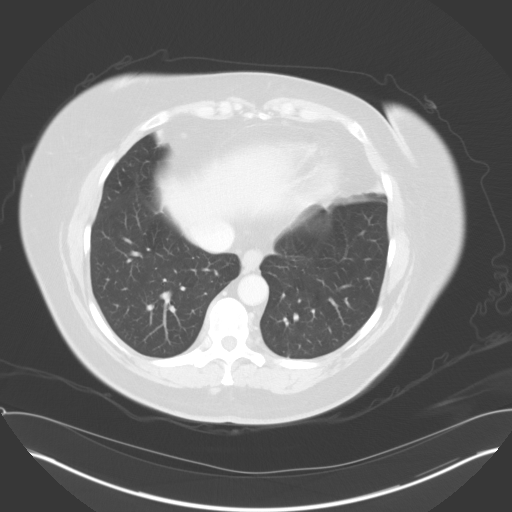

[13 of 32 positions shown; findings below may reference images not displayed]

FINDINGS: Lower chest: No worrisome lung base abnormality. Heart size within
normal limits.

Hepatobiliary: Mild fatty infiltration liver. 1 cm low-density
lesion dome of the right lobe cannot be confirmed as a simple cyst.
Mild biliary duct prominence felt to be related to
postcholecystectomy state without common bile duct stone noted.

Pancreas: No pancreatic mass or inflammation.

Spleen: No mass or enlargement.

Adrenals/Urinary Tract: No obstructing stone or hydronephrosis. No
renal mass. Right adrenal gland unremarkable. Left adrenal gland
with tiny adenoma. Partially contracted noncontrast filled urinary
bladder unremarkable.

Stomach/Bowel: Ascending colon appears slightly featureless with
minimally thickened wall without extraluminal bowel inflammatory
process. This may be normal. The additional finding of increased
number of normal to slightly prominent size lymph nodes within the
right lower quadrant combined with patient's history raises
possibility of low level chronic inflammatory process. No discrete
mass identified. Top-normal size appendix without inflammation.

Small hiatal hernia may be present. Under distended stomach without
abnormality noted.

Vascular/Lymphatic: Very mild atherosclerotic changes without
aneurysm or large vessel occlusion.

Reproductive: Uterine calcification and lobularity consistent with
small fibroids. Right ovarian 2.2 cm septated cystic structure
incompletely assessed. Pelvic sonogram can be obtained for further
delineation.

Other: No bowel containing hernia.

Musculoskeletal: Scoliosis lumbar spine convex left.
IMPRESSION: Ascending colon appears slightly featureless with minimally
thickened wall without extraluminal bowel inflammatory process. This
may be normal. The additional finding of increased number of normal
to slightly prominent size lymph nodes within the right lower
quadrant combined with patient's history raises possibility of low
level chronic inflammatory process.

Top-normal size appendix without surrounding inflammation.

Right ovarian 2.2 cm septated cystic structure incompletely
assessed. Pelvic sonogram can be obtained for further delineation.
Small fibroid suspected.

1 cm low-density lesion dome of the right lobe cannot be confirmed
as a simple cyst.

Mild biliary duct prominence felt to be related to
postcholecystectomy state.

Tiny left adrenal adenoma.

Aortic atherosclerosis.

## 2017-06-07 MED ORDER — IOPAMIDOL (ISOVUE-300) INJECTION 61%
100.0000 mL | Freq: Once | INTRAVENOUS | Status: AC | PRN
  Administered 2017-06-07: 100 mL via INTRAVENOUS

## 2017-06-13 ENCOUNTER — Ambulatory Visit
Admission: RE | Admit: 2017-06-13 | Discharge: 2017-06-13 | Disposition: A | Discharge status: Home / Self Care | Payer: PPO | Source: Ambulatory Visit | Attending: Internal Medicine | Admitting: Internal Medicine

## 2017-06-13 DIAGNOSIS — Z1231 Encounter for screening mammogram for malignant neoplasm of breast: Secondary | ICD-10-CM | POA: Insufficient documentation

## 2017-06-13 IMAGING — MG MM DIGITAL SCREENING BILAT W/ TOMO W/ CAD
8 of 13 series · 8 of 29 positions shown · non-contrast
Comparison: Previous exam(s).

CLINICAL DATA: Screening.

EXAM:
2D DIGITAL SCREENING BILATERAL MAMMOGRAM WITH CAD AND ADJUNCT TOMO

[L MLO (1 of 2)]
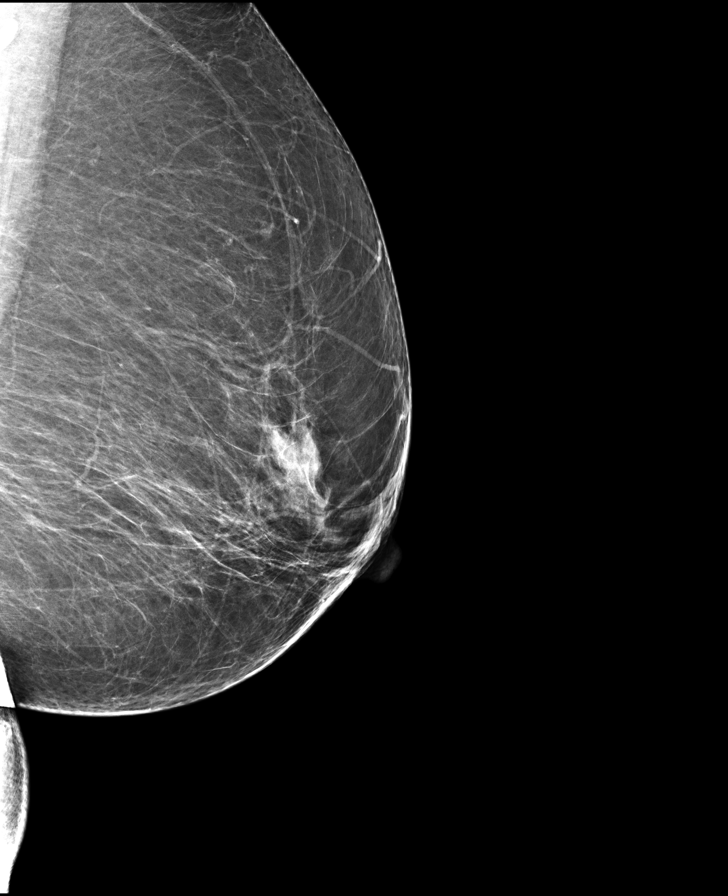

[R MLO synth-2D]
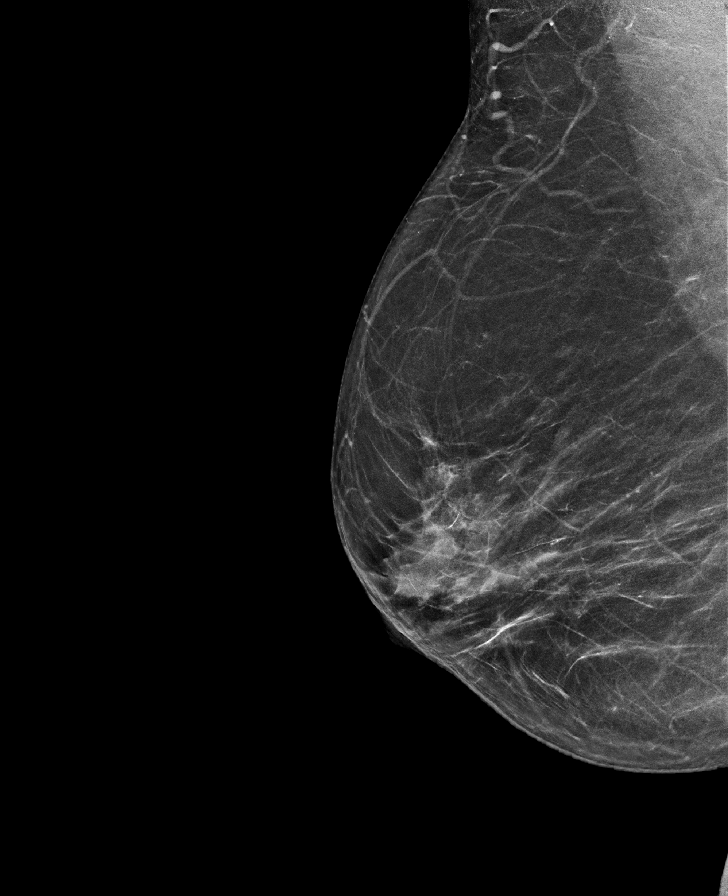

[L MLO (2 of 2)]
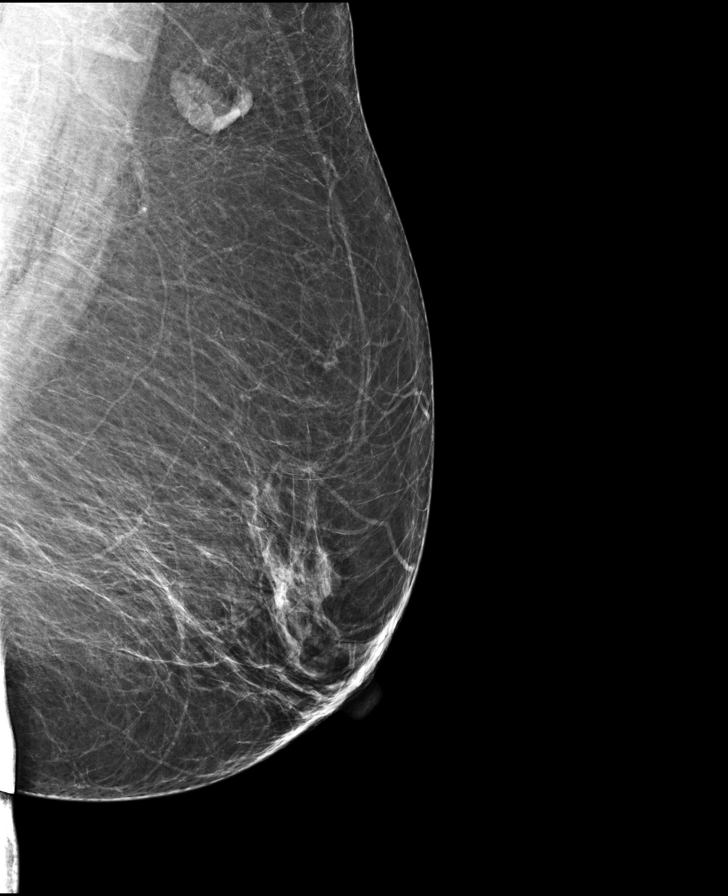

[L MLO synth-2D]
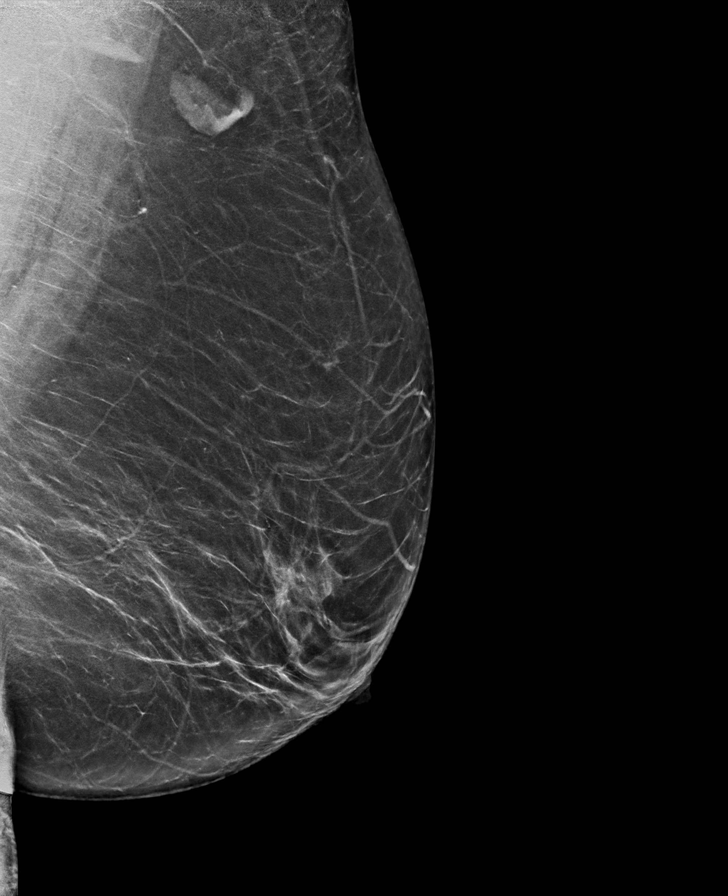

[L CC synth-2D]
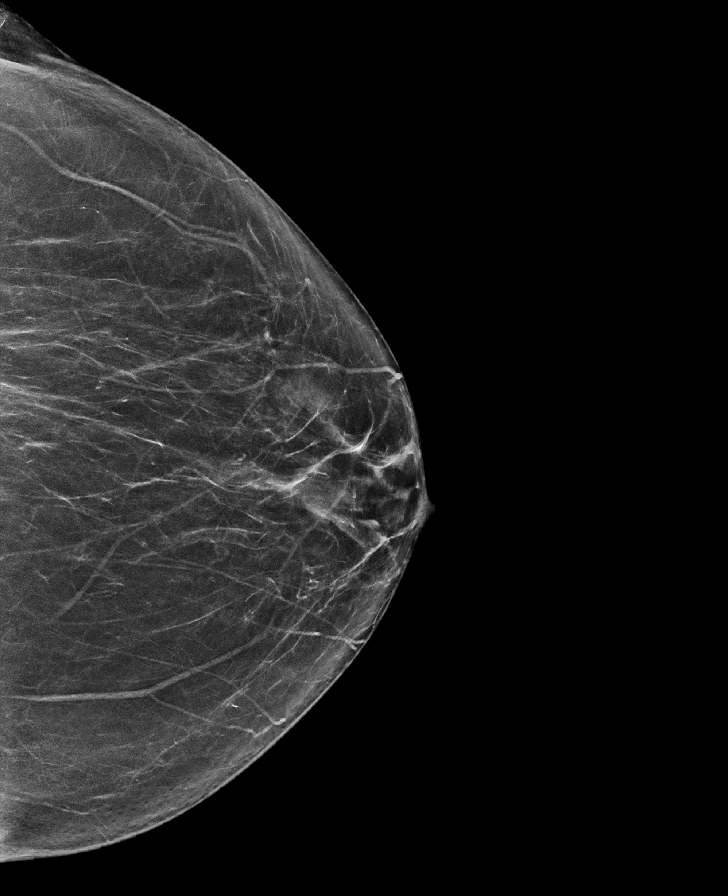

[R MLO]
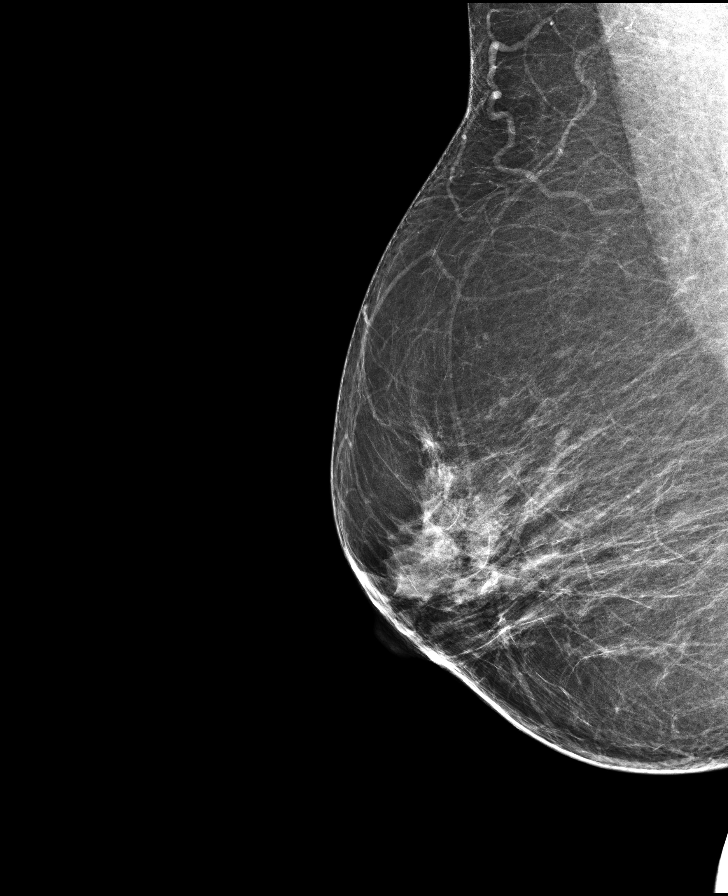

[L CC]
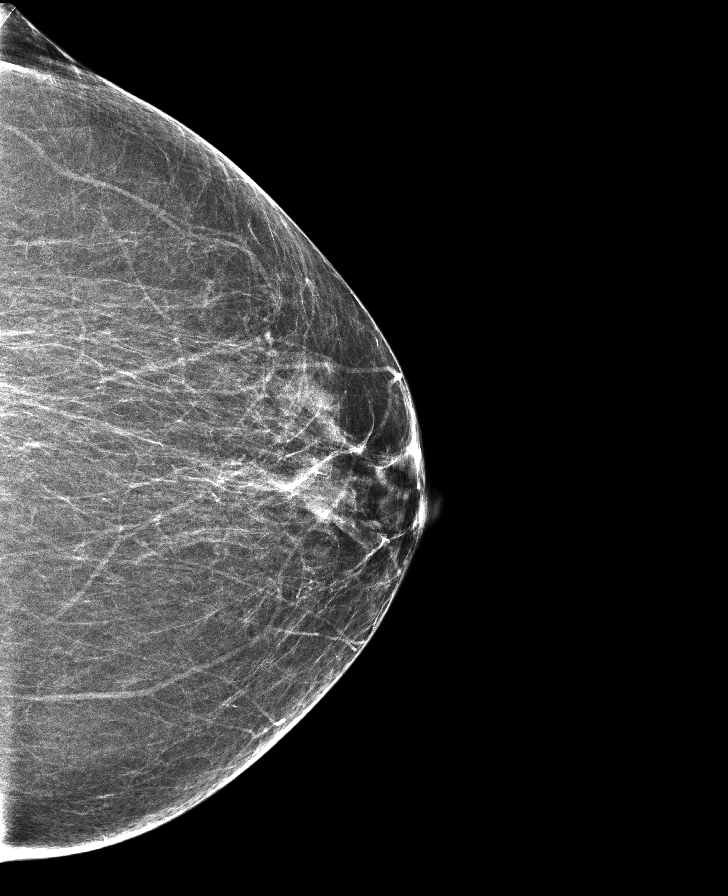

[R CC]
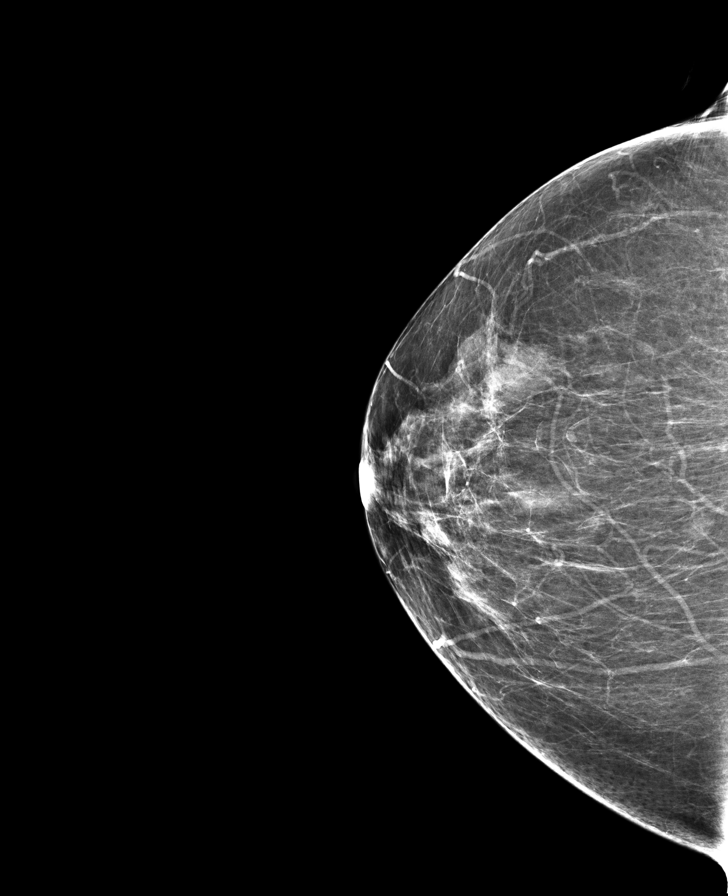

[8 of 29 positions shown; findings below may reference images not displayed]

ACR Breast Density Category b: There are scattered areas of
fibroglandular density.
FINDINGS: There are no findings suspicious for malignancy. Images were
processed with CAD.
IMPRESSION: No mammographic evidence of malignancy. A result letter of this
screening mammogram will be mailed directly to the patient.

RECOMMENDATION:
Screening mammogram in one year. (Code:[33])

BI-RADS CATEGORY  1: Negative.

## 2017-09-06 ENCOUNTER — Encounter: Payer: Self-pay | Admitting: *Deleted

## 2017-09-07 ENCOUNTER — Ambulatory Visit: Payer: PPO | Admitting: Anesthesiology

## 2017-09-07 ENCOUNTER — Encounter
Admission: RE | Disposition: A | Discharge status: Home / Self Care | Payer: Self-pay | Source: Ambulatory Visit | Attending: Internal Medicine

## 2017-09-07 ENCOUNTER — Ambulatory Visit
Admission: RE | Admit: 2017-09-07 | Discharge: 2017-09-07 | Disposition: A | Discharge status: Home / Self Care | Payer: PPO | Source: Ambulatory Visit | Attending: Internal Medicine | Admitting: Internal Medicine

## 2017-09-07 DIAGNOSIS — A0472 Enterocolitis due to Clostridium difficile, not specified as recurrent: Secondary | ICD-10-CM | POA: Insufficient documentation

## 2017-09-07 DIAGNOSIS — Z9049 Acquired absence of other specified parts of digestive tract: Secondary | ICD-10-CM | POA: Insufficient documentation

## 2017-09-07 DIAGNOSIS — A09 Infectious gastroenteritis and colitis, unspecified: Secondary | ICD-10-CM | POA: Diagnosis not present

## 2017-09-07 DIAGNOSIS — K3189 Other diseases of stomach and duodenum: Secondary | ICD-10-CM | POA: Diagnosis not present

## 2017-09-07 DIAGNOSIS — K2960 Other gastritis without bleeding: Secondary | ICD-10-CM | POA: Diagnosis not present

## 2017-09-07 DIAGNOSIS — I10 Essential (primary) hypertension: Secondary | ICD-10-CM | POA: Insufficient documentation

## 2017-09-07 DIAGNOSIS — Z7982 Long term (current) use of aspirin: Secondary | ICD-10-CM | POA: Diagnosis not present

## 2017-09-07 DIAGNOSIS — K5100 Ulcerative (chronic) pancolitis without complications: Secondary | ICD-10-CM | POA: Diagnosis not present

## 2017-09-07 DIAGNOSIS — K640 First degree hemorrhoids: Secondary | ICD-10-CM | POA: Insufficient documentation

## 2017-09-07 DIAGNOSIS — K2970 Gastritis, unspecified, without bleeding: Secondary | ICD-10-CM | POA: Diagnosis not present

## 2017-09-07 DIAGNOSIS — R197 Diarrhea, unspecified: Secondary | ICD-10-CM | POA: Diagnosis not present

## 2017-09-07 DIAGNOSIS — K449 Diaphragmatic hernia without obstruction or gangrene: Secondary | ICD-10-CM | POA: Insufficient documentation

## 2017-09-07 DIAGNOSIS — K2980 Duodenitis without bleeding: Secondary | ICD-10-CM | POA: Diagnosis not present

## 2017-09-07 HISTORY — PX: ESOPHAGOGASTRODUODENOSCOPY (EGD) WITH PROPOFOL: SHX5813

## 2017-09-07 HISTORY — PX: COLONOSCOPY WITH PROPOFOL: SHX5780

## 2017-09-07 HISTORY — DX: Hyperlipidemia, unspecified: E78.5

## 2017-09-07 LAB — C DIFFICILE QUICK SCREEN W PCR REFLEX
C Diff antigen: NEGATIVE
C Diff interpretation: NOT DETECTED
C Diff toxin: NEGATIVE

## 2017-09-07 SURGERY — COLONOSCOPY WITH PROPOFOL
Anesthesia: General

## 2017-09-07 MED ORDER — PROPOFOL 500 MG/50ML IV EMUL
INTRAVENOUS | Status: AC
  Filled 2017-09-07: qty 50

## 2017-09-07 MED ORDER — PROPOFOL 500 MG/50ML IV EMUL
INTRAVENOUS | Status: DC | PRN
  Administered 2017-09-07: 150 ug/kg/min via INTRAVENOUS

## 2017-09-07 MED ORDER — GLYCOPYRROLATE 0.2 MG/ML IJ SOLN
INTRAMUSCULAR | Status: DC | PRN
  Administered 2017-09-07: 0.2 mg via INTRAVENOUS

## 2017-09-07 MED ORDER — LIDOCAINE HCL (CARDIAC) 20 MG/ML IV SOLN
INTRAVENOUS | Status: DC | PRN
  Administered 2017-09-07: 40 mg via INTRAVENOUS

## 2017-09-07 MED ORDER — LIDOCAINE HCL (PF) 2 % IJ SOLN
INTRAMUSCULAR | Status: AC
  Filled 2017-09-07: qty 2

## 2017-09-07 MED ORDER — MIDAZOLAM HCL 2 MG/2ML IJ SOLN
INTRAMUSCULAR | Status: DC | PRN
  Administered 2017-09-07: 2 mg via INTRAVENOUS

## 2017-09-07 MED ORDER — PHENYLEPHRINE HCL 10 MG/ML IJ SOLN
INTRAMUSCULAR | Status: DC | PRN
  Administered 2017-09-07 (×4): 100 ug via INTRAVENOUS

## 2017-09-07 MED ORDER — MIDAZOLAM HCL 2 MG/2ML IJ SOLN
INTRAMUSCULAR | Status: AC
  Filled 2017-09-07: qty 2

## 2017-09-07 MED ORDER — PROPOFOL 10 MG/ML IV BOLUS
INTRAVENOUS | Status: DC | PRN
  Administered 2017-09-07: 80 mg via INTRAVENOUS
  Administered 2017-09-07: 10 mg via INTRAVENOUS

## 2017-09-07 MED ORDER — SODIUM CHLORIDE 0.9 % IV SOLN
INTRAVENOUS | Status: DC
  Administered 2017-09-07: 1000 mL via INTRAVENOUS

## 2017-09-07 MED ORDER — GLYCOPYRROLATE 0.2 MG/ML IJ SOLN
INTRAMUSCULAR | Status: AC
  Filled 2017-09-07: qty 1

## 2017-09-07 MED ORDER — FENTANYL CITRATE (PF) 100 MCG/2ML IJ SOLN
INTRAMUSCULAR | Status: AC
  Filled 2017-09-07: qty 2

## 2017-09-07 MED ORDER — FENTANYL CITRATE (PF) 100 MCG/2ML IJ SOLN
INTRAMUSCULAR | Status: DC | PRN
  Administered 2017-09-07: 25 ug via INTRAVENOUS

## 2017-09-07 NOTE — Op Note (Signed)
[facility] Gastroenterology Patient Name: Christy Summers Procedure Date: 09/07/2017 10:54 AM MRN: [id number] Account #: [id number] Date of Birth: 07/22/1951 Admit Type: Outpatient Age: 66 Room: [facility] ENDO ROOM 1 Gender: Female Note Status: Finalized Procedure:            Colonoscopy Indications:          Diarrhea (presumed secondary to inflammatory bowel                        disease), Clostridium difficile diarrhea, Functional                        diarrhea Providers:            Benay Pike. Antonia Culbertson MD, MD Medicines:            Propofol per Anesthesia Complications:        No immediate complications. Procedure:            Pre-Anesthesia Assessment:                       - ASA Grade Assessment: III - A patient with severe                        systemic disease.                       - After reviewing the risks and benefits, the patient                        was deemed in satisfactory condition to undergo the                        procedure.                       - The anesthesia plan was to use monitored anesthesia                        care (MAC).                       - Immediately prior to administration of medications,                        the patient was re-assessed for adequacy to receive                        sedatives.                       - Sedation was administered by an anesthesia                        professional. The sedation level attained was moderate.                       After obtaining informed consent, the colonoscope was                        passed under direct vision. Throughout the procedure,  the patient's blood pressure, pulse, and oxygen                        saturations were monitored continuously. The Olympus                        PCF-H180AL colonoscope ( S#: [id number] ) was introduced                        through the anus and advanced to the the terminal                        ileum, with  identification of the appendiceal orifice                        and IC valve. The colonoscopy was somewhat difficult                        due to significant looping. Successful completion of                        the procedure was aided by applying abdominal pressure.                        The patient tolerated the procedure fairly well. The                        quality of the bowel preparation was adequate. The                        terminal ileum, ileocecal valve, appendiceal orifice,                        and rectum were photographed. The entire colon was                        examined. Findings:      The terminal ileum appeared normal. Biopsies were taken with a cold       forceps for histology.      A patchy area of mildly erythematous mucosa was found in the entire       colon. Multiple random biopsies were obtained with cold forceps for       histology in the entire colon.      Non-bleeding internal hemorrhoids were found during retroflexion. The       hemorrhoids were Grade I (internal hemorrhoids that do not prolapse). Impression:           - The examined portion of the ileum was normal.                        Biopsied.                       - Erythematous mucosa in the entire examined colon.                       - Non-bleeding internal hemorrhoids.                       - Multiple random biopsies were obtained in the entire  colon. Recommendation:       - Check stool for Clostridium difficile toxin.                       - Stool obtained during colonoscopy for analysis for C.                        Dif                       - Return to my office in 3 weeks.                       - The findings and recommendations were discussed with                        the patient.                       - The findings and recommendations were discussed with                        the designated responsible adult. Procedure Code(s):    --- Professional ---                        [phone], Colonoscopy, flexible; with biopsy, single or                        multiple Diagnosis Code(s):    --- Professional ---                       K64.0, First degree hemorrhoids                       K63.89, Other specified diseases of intestine                       R19.7, Diarrhea, unspecified                       A04.7, Enterocolitis due to Clostridium difficile                       K59.1, Functional diarrhea CPT copyright 2016 American Medical Association. All rights reserved. The codes documented in this report are preliminary and upon coder review may  be revised to meet current compliance requirements. Efrain Sella MD, MD 09/07/2017 12:13:12 PM This report has been signed electronically. Number of Addenda: 0 Note Initiated On: 09/07/2017 10:54 AM      [facility]

## 2017-09-07 NOTE — H&P (Signed)
Outpatient short stay form Pre-procedure 09/07/2017 11:15 AM Christy Summers K. Alice Reichert, M.D.  Primary Physician: Dr. Ginette Pitman  Reason for visit:  Chronic diarrhea (functional)  History of present illness:  Patient with a negative Cologard stool test earlier this year presents with hx of "explosive diarrhea" intermittently for several months. Denies abdominal pain, hematochezia or melena. Had CDif stool in April 2018 s/p treatment with Vancomycin recently.     Current Facility-Administered Medications:  .  0.9 %  sodium chloride infusion, , Intravenous, Continuous, [place], Benay Pike, MD, Last Rate: 20 mL/hr at 09/07/17 1002, 1,000 mL at 09/07/17 1002  Prescriptions Prior to Admission  Medication Sig Dispense Refill Last Dose  . amLODipine (NORVASC) 10 MG tablet Take 10 mg by mouth daily.   09/07/2017 at 0445  . aspirin 81 MG chewable tablet Chew 81 mg by mouth daily.   09/06/2017 at Unknown time  . cyanocobalamin 1000 MCG tablet Take 1,000 mcg by mouth daily.   09/06/2017 at Unknown time  . losartan-hydrochlorothiazide (HYZAAR) 100-25 MG tablet Take 1 tablet by mouth daily.   09/06/2017 at Unknown time  . lovastatin (MEVACOR) 20 MG tablet Take 20 mg by mouth at bedtime.   09/06/2017 at Unknown time  . potassium chloride (K-DUR) 10 MEQ tablet Take 10 mEq by mouth daily.   Past Week at Unknown time     No Known Allergies   Past Medical History:  Diagnosis Date  . Elevated lipids   . Hypertension     Review of systems:      Physical Exam  Gen: NAD. Appears comfortable.  HEENT: [place]/AT. PERRLA. Normal external ear exam.  Chest: CTA, no wheezes.  CV: RR nl S1, S2. No gallops.  Abd: soft, nt, nd. BS+  Ext: no edema. Pulses 2+  Neuro: Alert and oriented. Judgement appears normal. Nonfocal.      Planned proceedures:  EGD and colonoscopy. The patient understands the nature of the planned procedure, indications, risks, alternatives and potential complications including but not limited to  bleeding, infection, perforation, damage to internal organs and possible oversedation/side effects from anesthesia. The patient agrees and gives consent to proceed.     [name] K. Alice Reichert, M.D. Gastroenterology 09/07/2017  11:15 AM

## 2017-09-07 NOTE — Anesthesia Post-op Follow-up Note (Signed)
Anesthesia QCDR form completed.        

## 2017-09-07 NOTE — Anesthesia Postprocedure Evaluation (Signed)
Anesthesia Post Note  Patient: Christy Summers  Procedure(s) Performed: Procedure(s) (LRB): COLONOSCOPY WITH PROPOFOL (N/A) ESOPHAGOGASTRODUODENOSCOPY (EGD) WITH PROPOFOL (N/A)  Patient location during evaluation: PACU Anesthesia Type: General Level of consciousness: awake and alert Pain management: pain level controlled Vital Signs Assessment: post-procedure vital signs reviewed and stable Respiratory status: spontaneous breathing, nonlabored ventilation, respiratory function stable and patient connected to nasal cannula oxygen Cardiovascular status: blood pressure returned to baseline and stable Postop Assessment: no apparent nausea or vomiting Anesthetic complications: no     Last Vitals:  Vitals:   09/07/17 1219 09/07/17 1229  BP: 90/77   Pulse:    Resp:  16  Temp:    SpO2:      Last Pain:  Vitals:   09/07/17 1209  TempSrc: Tympanic                 [name]

## 2017-09-07 NOTE — Anesthesia Procedure Notes (Signed)
Date/Time: 09/07/2017 10:24 AM Performed by: Doreen Salvage Pre-anesthesia Checklist: Patient identified, Emergency Drugs available, Suction available and Patient being monitored Patient Re-evaluated:Patient Re-evaluated prior to induction Oxygen Delivery Method: Nasal cannula Induction Type: IV induction Dental Injury: Teeth and Oropharynx as per pre-operative assessment  Comments: Nasal cannula with etCO2 monitoring

## 2017-09-07 NOTE — Anesthesia Preprocedure Evaluation (Signed)
Anesthesia Evaluation  Patient identified by MRN, date of birth, ID band Patient awake    Reviewed: Allergy & Precautions, H&P , NPO status , Patient's Chart, lab work & pertinent test results, reviewed documented beta blocker date and time   Airway Mallampati: II   Neck ROM: full    Dental  (+) Poor Dentition   Pulmonary neg pulmonary ROS,    Pulmonary exam normal        Cardiovascular Exercise Tolerance: Good hypertension, On Medications negative cardio ROS Normal cardiovascular exam Rhythm:regular Rate:Normal     Neuro/Psych negative neurological ROS  negative psych ROS   GI/Hepatic negative GI ROS, Neg liver ROS,   Endo/Other  negative endocrine ROS  Renal/GU negative Renal ROS  negative genitourinary   Musculoskeletal   Abdominal   Peds  Hematology negative hematology ROS (+)   Anesthesia Other Findings Past Medical History: No date: Elevated lipids No date: Hypertension Past Surgical History: No date: CHOLECYSTECTOMY BMI    Body Mass Index:  35.02 kg/m     Reproductive/Obstetrics negative OB ROS                             Anesthesia Physical Anesthesia Plan  ASA: II  Anesthesia Plan: General   Post-op Pain Management:    Induction:   PONV Risk Score and Plan:   Airway Management Planned:   Additional Equipment:   Intra-op Plan:   Post-operative Plan:   Informed Consent: I have reviewed the patients History and Physical, chart, labs and discussed the procedure including the risks, benefits and alternatives for the proposed anesthesia with the patient or authorized representative who has indicated his/her understanding and acceptance.   Dental Advisory Given  Plan Discussed with: CRNA  Anesthesia Plan Comments:         Anesthesia Quick Evaluation

## 2017-09-07 NOTE — Op Note (Signed)
[facility] Gastroenterology Patient Name: Christy Summers Procedure Date: 09/07/2017 10:55 AM MRN: [id number] Account #: [id number] Date of Birth: [birth year] Admit Type: Outpatient Age: 65 Room: [facility] ENDO ROOM 1 Gender: Female Note Status: Finalized Procedure:            Upper GI endoscopy Indications:          Endoscopy to assess diarrhea in patient suspected of                        having celiac disease, Endoscopy to assess diarrhea in                        patient suspected of having disease of the small-bowel,                        Diagnostic sampling is indicated, Diagnostic procedure Providers:            Benay Pike. Alice Reichert MD, MD Referring MD:         Tracie Harrier, MD (Referring MD) Medicines:            Propofol per Anesthesia Complications:        No immediate complications. Procedure:            Pre-Anesthesia Assessment:                       - ASA Grade Assessment: III - A patient with severe                        systemic disease.                       - After reviewing the risks and benefits, the patient                        was deemed in satisfactory condition to undergo the                        procedure.                       - The anesthesia plan was to use moderate                        sedation/analgesia (conscious sedation).                       - The anesthesia plan was to use monitored anesthesia                        care (MAC).                       - Immediately prior to administration of medications,                        the patient was re-assessed for adequacy to receive                        sedatives.                       - Sedation was administered by  an anesthesia                        professional. The sedation level attained was moderate.                       After obtaining informed consent, the endoscope was                        passed under direct vision. Throughout the procedure,    the patient's blood pressure, pulse, and oxygen                        saturations were monitored continuously. The Endoscope                        was introduced through the mouth, and advanced to the                        third part of duodenum. The upper GI endoscopy was                        accomplished without difficulty. The patient tolerated                        the procedure well. Findings:      The esophagus was normal.      The stomach was normal.      The examined duodenum was normal. Biopsies for histology were taken with       a cold forceps for evaluation of celiac disease. Impression:           - Normal esophagus.                       - Normal stomach.                       - Normal examined duodenum.                       - No specimens collected.                       - Small sliding hiatal hernia.                       - The examination was otherwise normal. Recommendation:       - Await pathology results.                       - Repeat upper endoscopy for surveillance based on                        pathology results.                       - Proceed with colonoscopy Procedure Code(s):    --- Professional ---                       [phone], Esophagogastroduodenoscopy, flexible, transoral;                        with biopsy, single or multiple Diagnosis Code(s):    ---  Professional ---                       K44.9, Diaphragmatic hernia without obstruction or                        gangrene                       R19.7, Diarrhea, unspecified CPT copyright 2016 American Medical Association. All rights reserved. The codes documented in this report are preliminary and upon coder review may  be revised to meet current compliance requirements. Efrain Sella MD, MD 09/07/2017 12:07:01 PM This report has been signed electronically. Number of Addenda: 0 Note Initiated On: 09/07/2017 10:55 AM Scope Withdrawal Time: 0 hours 13 minutes 48 seconds  Total Procedure  Duration: 0 hours 21 minutes 24 seconds       [facility]

## 2017-09-07 NOTE — Interval H&P Note (Signed)
History and Physical Interval Note:  09/07/2017 11:19 AM  Christy Summers  has presented today for surgery, with the diagnosis of WT LOSS DIARRHEA BOWEL CHANGES  The various methods of treatment have been discussed with the patient and family. After consideration of risks, benefits and other options for treatment, the patient has consented to  Procedure(s): COLONOSCOPY WITH PROPOFOL (N/A) ESOPHAGOGASTRODUODENOSCOPY (EGD) WITH PROPOFOL (N/A) as a surgical intervention .  The patient's history has been reviewed, patient examined, no change in status, stable for surgery.  I have reviewed the patient's chart and labs.  Questions were answered to the patient's satisfaction.     [employer] [place]

## 2017-09-07 NOTE — Transfer of Care (Signed)
Immediate Anesthesia Transfer of Care Note  Patient: Christy Summers  Procedure(s) Performed: Procedure(s): COLONOSCOPY WITH PROPOFOL (N/A) ESOPHAGOGASTRODUODENOSCOPY (EGD) WITH PROPOFOL (N/A)  Patient Location: PACU  Anesthesia Type:General  Level of Consciousness: sedated  Airway & Oxygen Therapy: Patient Spontanous Breathing and Patient connected to face mask oxygen  Post-op Assessment: Report given to RN and Post -op Vital signs reviewed and stable  Post vital signs: Reviewed and stable  Last Vitals:  Vitals:   09/07/17 0953 09/07/17 1209  BP: 133/68 (!) 98/54  Pulse: 99 86  Resp: 16 16  Temp: (!) 36.3 C (!) 36.2 C  SpO2: 96% 43%    Complications: No apparent anesthesia complications

## 2017-09-08 ENCOUNTER — Encounter: Payer: Self-pay | Admitting: Internal Medicine

## 2017-09-09 LAB — SURGICAL PATHOLOGY

## 2017-09-29 DIAGNOSIS — R5383 Other fatigue: Secondary | ICD-10-CM | POA: Diagnosis not present

## 2017-09-29 DIAGNOSIS — A0472 Enterocolitis due to Clostridium difficile, not specified as recurrent: Secondary | ICD-10-CM | POA: Diagnosis not present

## 2017-09-29 DIAGNOSIS — K529 Noninfective gastroenteritis and colitis, unspecified: Secondary | ICD-10-CM | POA: Diagnosis not present

## 2017-10-01 ENCOUNTER — Other Ambulatory Visit
Admission: RE | Admit: 2017-10-01 | Discharge: 2017-10-01 | Disposition: A | Discharge status: Home / Self Care | Payer: PPO | Source: Ambulatory Visit | Attending: Internal Medicine | Admitting: Internal Medicine

## 2017-10-01 DIAGNOSIS — A0472 Enterocolitis due to Clostridium difficile, not specified as recurrent: Secondary | ICD-10-CM | POA: Insufficient documentation

## 2017-10-01 LAB — C DIFFICILE QUICK SCREEN W PCR REFLEX
C Diff antigen: POSITIVE — AB
C Diff toxin: NEGATIVE

## 2017-10-01 LAB — CLOSTRIDIUM DIFFICILE BY PCR: Toxigenic C. Difficile by PCR: POSITIVE — AB

## 2017-10-13 DIAGNOSIS — A0472 Enterocolitis due to Clostridium difficile, not specified as recurrent: Secondary | ICD-10-CM | POA: Diagnosis not present

## 2017-10-17 LAB — GIARDIA, EIA; OVA/PARASITE: Giardia Ag, Stl: NEGATIVE

## 2017-10-17 LAB — O&P RESULT

## 2017-11-01 ENCOUNTER — Other Ambulatory Visit
Admission: RE | Admit: 2017-11-01 | Discharge: 2017-11-01 | Disposition: A | Discharge status: Home / Self Care | Payer: PPO | Source: Ambulatory Visit | Attending: Internal Medicine | Admitting: Internal Medicine

## 2017-11-01 DIAGNOSIS — R197 Diarrhea, unspecified: Secondary | ICD-10-CM | POA: Insufficient documentation

## 2017-11-01 DIAGNOSIS — A0472 Enterocolitis due to Clostridium difficile, not specified as recurrent: Secondary | ICD-10-CM | POA: Diagnosis not present

## 2017-11-01 LAB — C DIFFICILE QUICK SCREEN W PCR REFLEX
C Diff antigen: NEGATIVE
C Diff interpretation: NOT DETECTED
C Diff toxin: NEGATIVE

## 2017-11-08 ENCOUNTER — Other Ambulatory Visit
Admission: RE | Admit: 2017-11-08 | Discharge: 2017-11-08 | Disposition: A | Discharge status: Home / Self Care | Payer: PPO | Source: Ambulatory Visit | Attending: Internal Medicine | Admitting: Internal Medicine

## 2017-11-08 DIAGNOSIS — A0472 Enterocolitis due to Clostridium difficile, not specified as recurrent: Secondary | ICD-10-CM | POA: Diagnosis not present

## 2017-11-08 LAB — CLOSTRIDIUM DIFFICILE BY PCR: Toxigenic C. Difficile by PCR: POSITIVE — AB

## 2017-11-08 LAB — C DIFFICILE QUICK SCREEN W PCR REFLEX
C Diff antigen: POSITIVE — AB
C Diff toxin: NEGATIVE

## 2017-11-23 DIAGNOSIS — E7800 Pure hypercholesterolemia, unspecified: Secondary | ICD-10-CM | POA: Diagnosis not present

## 2017-11-23 DIAGNOSIS — Z0000 Encounter for general adult medical examination without abnormal findings: Secondary | ICD-10-CM | POA: Diagnosis not present

## 2017-11-23 DIAGNOSIS — R748 Abnormal levels of other serum enzymes: Secondary | ICD-10-CM | POA: Diagnosis not present

## 2017-11-23 DIAGNOSIS — I10 Essential (primary) hypertension: Secondary | ICD-10-CM | POA: Diagnosis not present

## 2017-11-23 DIAGNOSIS — R197 Diarrhea, unspecified: Secondary | ICD-10-CM | POA: Diagnosis not present

## 2017-11-30 DIAGNOSIS — A0472 Enterocolitis due to Clostridium difficile, not specified as recurrent: Secondary | ICD-10-CM | POA: Diagnosis not present

## 2017-11-30 DIAGNOSIS — I10 Essential (primary) hypertension: Secondary | ICD-10-CM | POA: Diagnosis not present

## 2017-11-30 DIAGNOSIS — E7800 Pure hypercholesterolemia, unspecified: Secondary | ICD-10-CM | POA: Diagnosis not present

## 2017-11-30 DIAGNOSIS — D649 Anemia, unspecified: Secondary | ICD-10-CM | POA: Diagnosis not present

## 2017-11-30 DIAGNOSIS — Z0000 Encounter for general adult medical examination without abnormal findings: Secondary | ICD-10-CM | POA: Diagnosis not present

## 2017-12-03 ENCOUNTER — Other Ambulatory Visit
Admission: RE | Admit: 2017-12-03 | Discharge: 2017-12-03 | Disposition: A | Discharge status: Home / Self Care | Payer: PPO | Source: Skilled Nursing Facility | Attending: Internal Medicine | Admitting: Internal Medicine

## 2017-12-03 DIAGNOSIS — R197 Diarrhea, unspecified: Secondary | ICD-10-CM | POA: Diagnosis not present

## 2017-12-03 LAB — C DIFFICILE QUICK SCREEN W PCR REFLEX
C Diff antigen: NEGATIVE
C Diff interpretation: NOT DETECTED
C Diff toxin: NEGATIVE

## 2017-12-27 DIAGNOSIS — R197 Diarrhea, unspecified: Secondary | ICD-10-CM | POA: Diagnosis not present

## 2017-12-27 DIAGNOSIS — A0472 Enterocolitis due to Clostridium difficile, not specified as recurrent: Secondary | ICD-10-CM | POA: Diagnosis not present

## 2017-12-28 ENCOUNTER — Other Ambulatory Visit
Admission: RE | Admit: 2017-12-28 | Discharge: 2017-12-28 | Disposition: A | Discharge status: Home / Self Care | Payer: PPO | Source: Ambulatory Visit | Attending: Internal Medicine | Admitting: Internal Medicine

## 2017-12-28 DIAGNOSIS — A0472 Enterocolitis due to Clostridium difficile, not specified as recurrent: Secondary | ICD-10-CM | POA: Diagnosis not present

## 2017-12-28 DIAGNOSIS — R197 Diarrhea, unspecified: Secondary | ICD-10-CM | POA: Insufficient documentation

## 2017-12-28 LAB — C DIFFICILE QUICK SCREEN W PCR REFLEX
C Diff antigen: NEGATIVE
C Diff interpretation: NOT DETECTED
C Diff toxin: NEGATIVE

## 2018-01-04 ENCOUNTER — Ambulatory Visit: Admission: RE | Admit: 2018-01-04 | Payer: PPO | Source: Ambulatory Visit | Admitting: Internal Medicine

## 2018-01-04 ENCOUNTER — Encounter: Admission: RE | Payer: Self-pay | Source: Ambulatory Visit

## 2018-01-04 SURGERY — COLONOSCOPY WITH PROPOFOL
Anesthesia: General

## 2018-02-22 DIAGNOSIS — A0472 Enterocolitis due to Clostridium difficile, not specified as recurrent: Secondary | ICD-10-CM | POA: Diagnosis not present

## 2018-02-22 DIAGNOSIS — Z0000 Encounter for general adult medical examination without abnormal findings: Secondary | ICD-10-CM | POA: Diagnosis not present

## 2018-02-22 DIAGNOSIS — I10 Essential (primary) hypertension: Secondary | ICD-10-CM | POA: Diagnosis not present

## 2018-02-22 DIAGNOSIS — D649 Anemia, unspecified: Secondary | ICD-10-CM | POA: Diagnosis not present

## 2018-02-22 DIAGNOSIS — E7800 Pure hypercholesterolemia, unspecified: Secondary | ICD-10-CM | POA: Diagnosis not present

## 2018-03-01 ENCOUNTER — Other Ambulatory Visit: Payer: Self-pay | Admitting: Internal Medicine

## 2018-03-01 DIAGNOSIS — D649 Anemia, unspecified: Secondary | ICD-10-CM | POA: Diagnosis not present

## 2018-03-01 DIAGNOSIS — R197 Diarrhea, unspecified: Secondary | ICD-10-CM | POA: Diagnosis not present

## 2018-03-01 DIAGNOSIS — R945 Abnormal results of liver function studies: Secondary | ICD-10-CM

## 2018-03-01 DIAGNOSIS — R7989 Other specified abnormal findings of blood chemistry: Secondary | ICD-10-CM

## 2018-03-01 DIAGNOSIS — R634 Abnormal weight loss: Secondary | ICD-10-CM | POA: Diagnosis not present

## 2018-03-01 DIAGNOSIS — I10 Essential (primary) hypertension: Secondary | ICD-10-CM | POA: Diagnosis not present

## 2018-03-01 DIAGNOSIS — R7309 Other abnormal glucose: Secondary | ICD-10-CM | POA: Diagnosis not present

## 2018-03-09 ENCOUNTER — Ambulatory Visit
Admission: RE | Admit: 2018-03-09 | Discharge: 2018-03-09 | Disposition: A | Discharge status: Home / Self Care | Payer: PPO | Source: Ambulatory Visit | Attending: Internal Medicine | Admitting: Internal Medicine

## 2018-03-09 DIAGNOSIS — D649 Anemia, unspecified: Secondary | ICD-10-CM | POA: Diagnosis present

## 2018-03-09 DIAGNOSIS — R634 Abnormal weight loss: Secondary | ICD-10-CM

## 2018-03-09 DIAGNOSIS — R599 Enlarged lymph nodes, unspecified: Secondary | ICD-10-CM | POA: Diagnosis not present

## 2018-03-09 DIAGNOSIS — K6389 Other specified diseases of intestine: Secondary | ICD-10-CM | POA: Insufficient documentation

## 2018-03-09 DIAGNOSIS — R945 Abnormal results of liver function studies: Secondary | ICD-10-CM | POA: Diagnosis present

## 2018-03-09 DIAGNOSIS — R197 Diarrhea, unspecified: Secondary | ICD-10-CM

## 2018-03-09 DIAGNOSIS — R7989 Other specified abnormal findings of blood chemistry: Secondary | ICD-10-CM

## 2018-03-09 DIAGNOSIS — K769 Liver disease, unspecified: Secondary | ICD-10-CM | POA: Diagnosis not present

## 2018-03-09 DIAGNOSIS — D259 Leiomyoma of uterus, unspecified: Secondary | ICD-10-CM | POA: Insufficient documentation

## 2018-03-09 LAB — POCT I-STAT CREATININE: Creatinine, Ser: 1.8 mg/dL — ABNORMAL HIGH (ref 0.44–1.00)

## 2018-03-09 IMAGING — CT CT ABD-PELV W/ CM
2 of 5 series · 15 of 46 positions shown, 17 images · IV contrast (APPLIED)
Comparison: [DATE]

CLINICAL DATA: Unspecified disorder of liver function. Abnormal
LFT.

EXAM:
CT ABDOMEN AND PELVIS WITH CONTRAST
TECHNIQUE: Multidetector CT imaging of the abdomen and pelvis was performed
using the standard protocol following bolus administration of
intravenous contrast.
CONTRAST:  75mL [T5] IOPAMIDOL ([T5]) INJECTION 61%

[Series 2: axial st · axial · 0.78mm/px · z∈[-516,-106]mm · 12 of 94 slices shown, 14 images]
[im 6/94  soft-tissue]
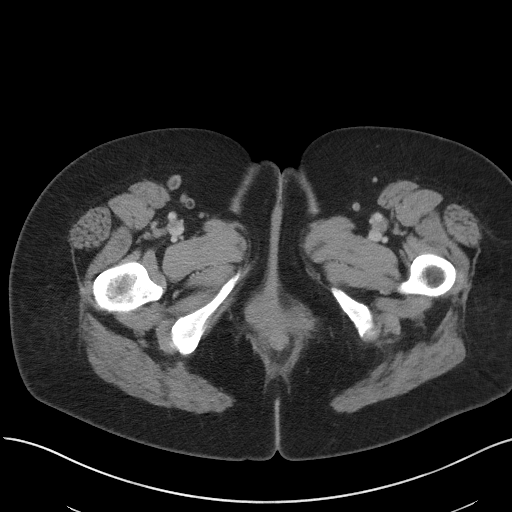
[im 6/94  bone]
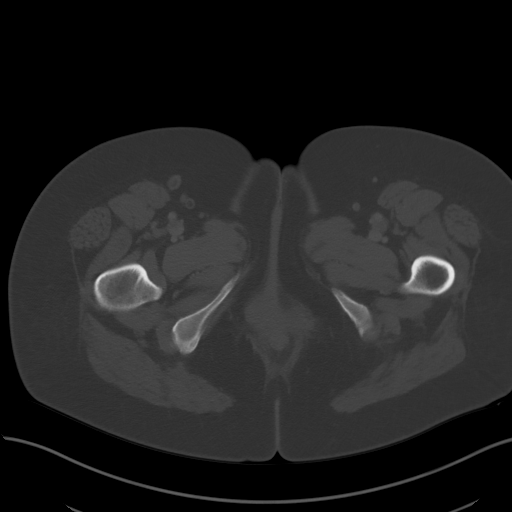
[im 16/94  soft-tissue]
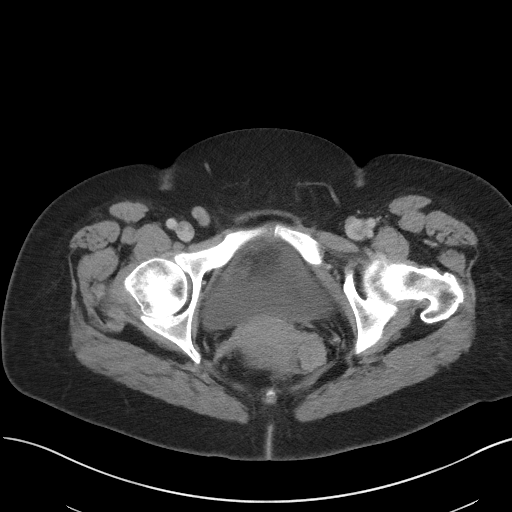
[im 21/94  soft-tissue]
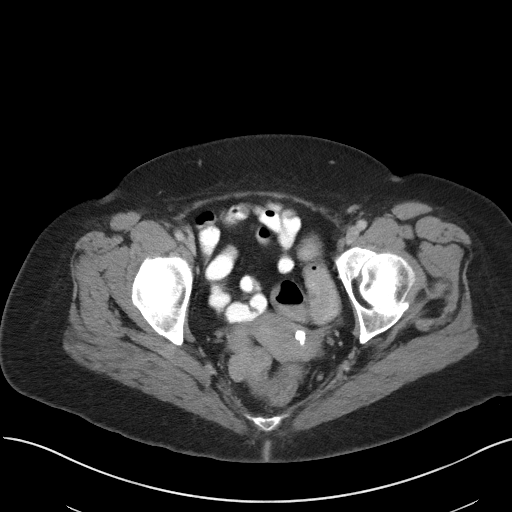
[im 26/94  soft-tissue]
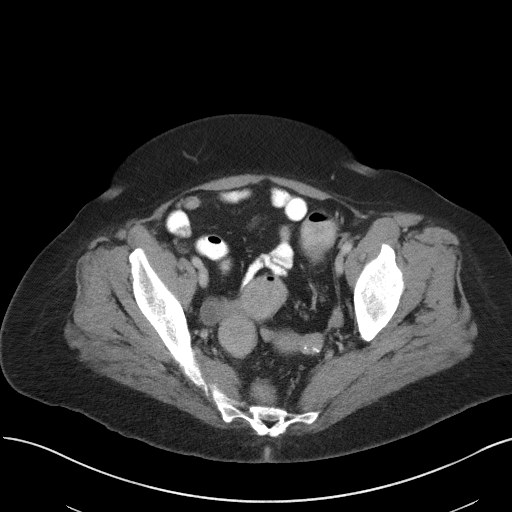
[im 37/94  soft-tissue]
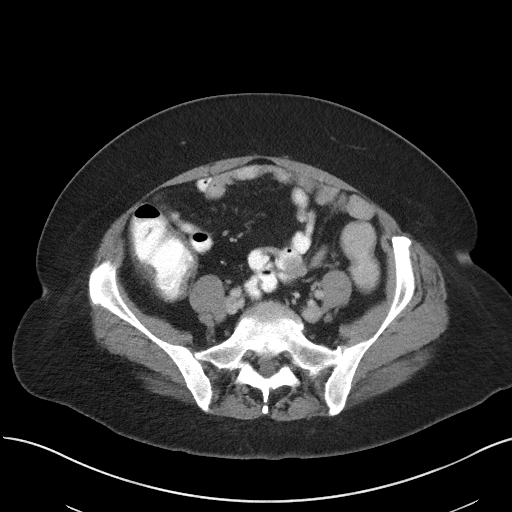
[im 42/94  soft-tissue]
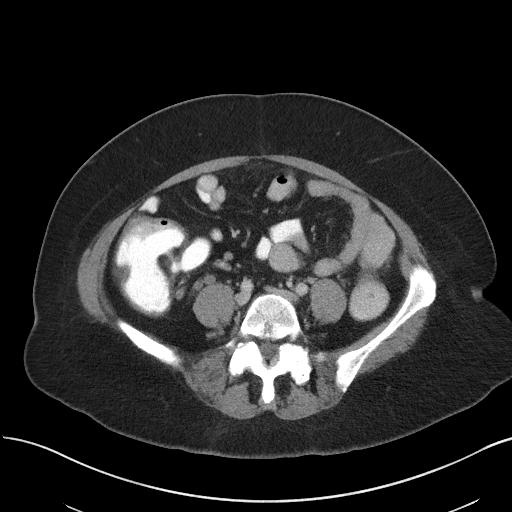
[im 52/94  soft-tissue]
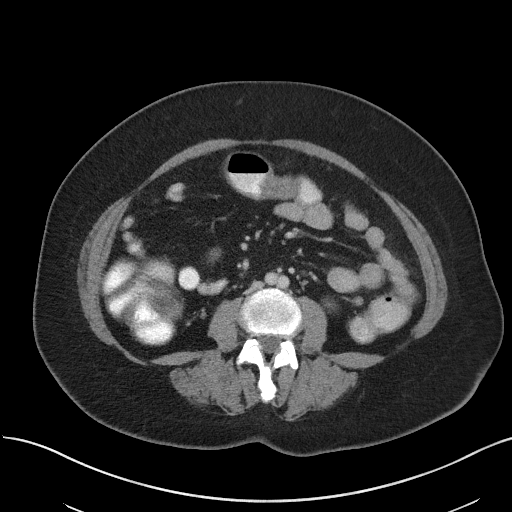
[im 57/94  soft-tissue]
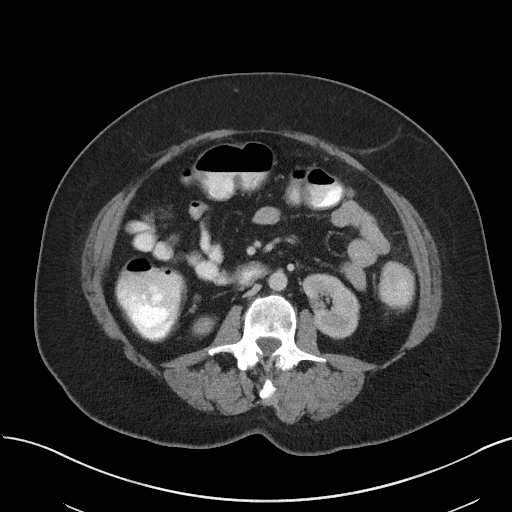
[im 68/94  soft-tissue]
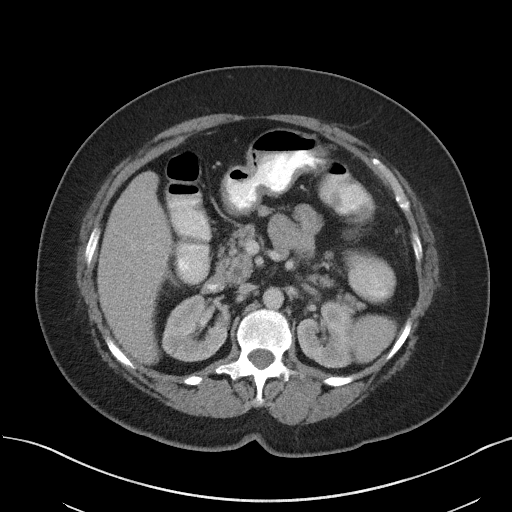
[im 68/94  bone]
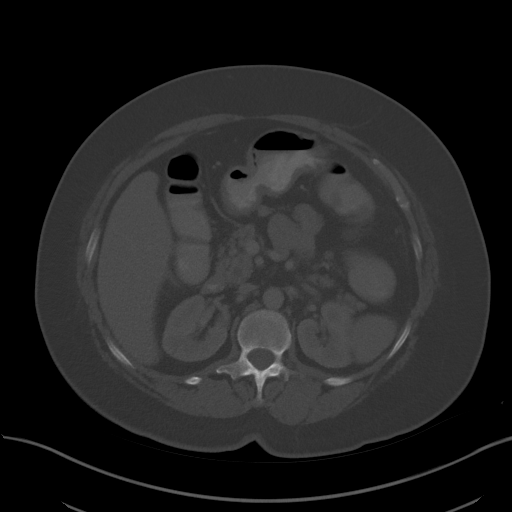
[im 73/94  soft-tissue]
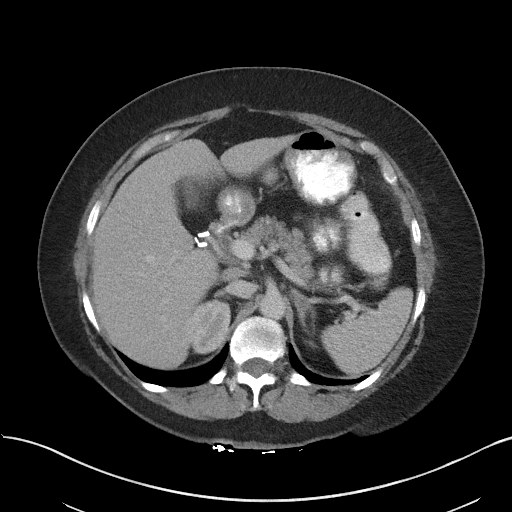
[im 78/94  soft-tissue]
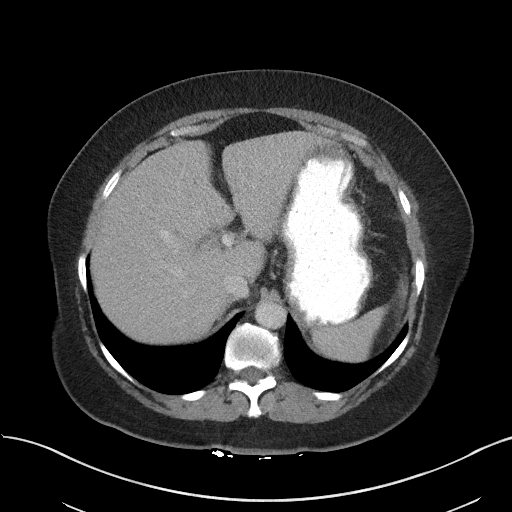
[im 88/94  soft-tissue]
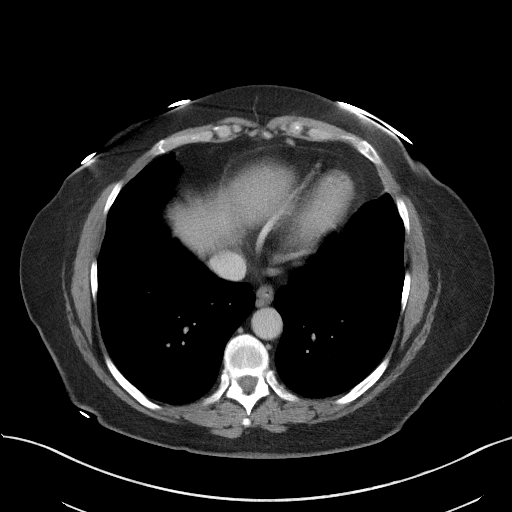

[Series 5: coronal st · coronal · 0.77mm/px · 3 of 90 slices shown]
[im 30/90  soft-tissue]
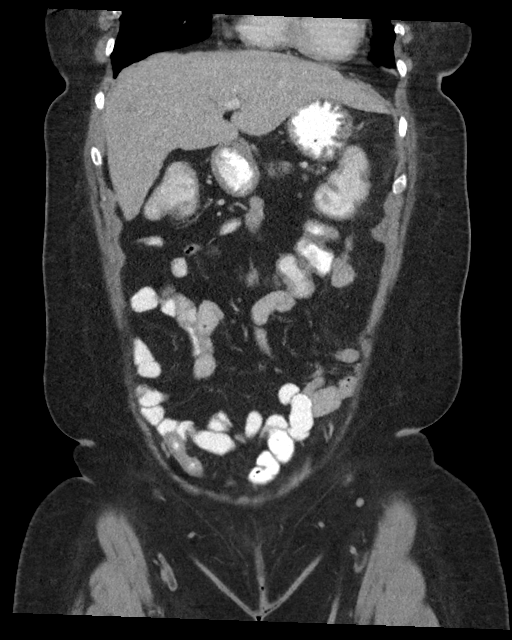
[im 40/90  soft-tissue]
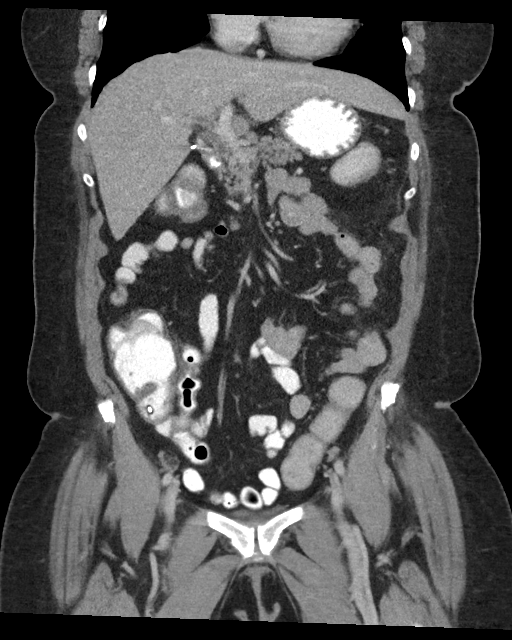
[im 50/90  soft-tissue]
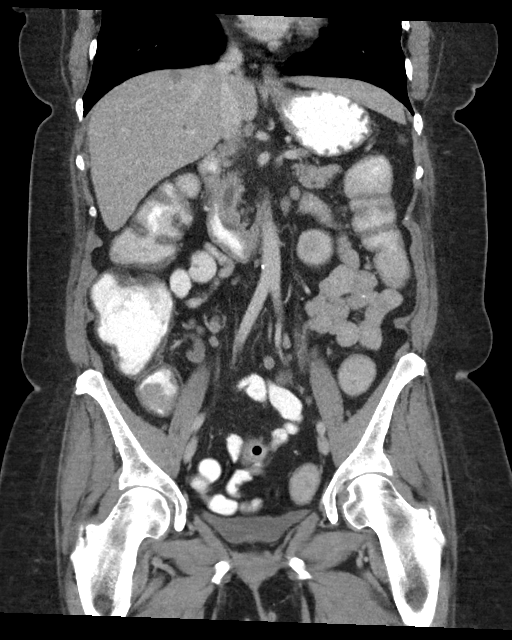

[15 of 46 positions shown; findings below may reference images not displayed]

FINDINGS: Lower chest: Heart size is top normal without pericardial effusion.
No acute pneumonic consolidation, effusion or pneumothorax.

Hepatobiliary: Stable 1 cm hypodensity in the dome of the right
hepatic lobe possibly representing small hemangioma or cyst. No
significant change is noted. Status post cholecystectomy. No biliary
dilatation.

Pancreas: Normal

Spleen: Normal

Adrenals/Urinary Tract: Normal bilateral adrenal glands. Symmetric
enhancement of both kidneys. No nephrolithiasis nor enhancing renal
mass. No obstructive uropathy. The urinary bladder is unremarkable
for the degree of distention.

Stomach/Bowel: Normal appendix measuring up to 7 mm in caliber.
Contrast distended stomach with normal small bowel rotation. No
bowel obstruction or inflammation. The colon is slightly
thick-walled in appearance some of which may be due to slight
underdistention. The possibility of a mild diffuse colitis is not
excluded.

Vascular/Lymphatic: Scattered mildly enlarged mesenteric and right
lower quadrant lymph nodes similar in appearance to prior may
reflect mesenteric adenitis. Normal caliber aorta. Patent branch
vessels. Minimal and aortoiliac atherosclerosis.

Reproductive: 9 mm intramural uterine calcification compatible with
a degenerated fibroid. Stable thinly septated 2 cm cyst in the right
ovary unchanged in appearance, likely a benign finding given
stability.

Other: No free air nor free fluid.

Musculoskeletal: Gentle levoconvex curvature of the lumbar spine.
IMPRESSION: 1. Scattered mildly enlarged mesenteric and right lower quadrant
lymph nodes query a lymphoproliferative disorder or possibly
adenitis.
2. Mild transmural thickening of the colon from cecum to rectum some
which may be due to underdistention. A diffuse mild colitis is not
entirely excluded.
3. Stable 1 cm hypodensity in the right hepatic dome unchanged in
appearance and may reflect small hemangioma or possibly a cyst.
Further characterization is not possible however based on this
study. Given stability, this too is likely a benign finding.
4. Stable 2 cm thinly septated right ovarian cyst. No worrisome
features noted.
5. Calcified uterine fibroid measuring 9 mm.

## 2018-03-09 MED ORDER — IOPAMIDOL (ISOVUE-300) INJECTION 61%
75.0000 mL | Freq: Once | INTRAVENOUS | Status: AC | PRN
  Administered 2018-03-09: 75 mL via INTRAVENOUS

## 2018-03-17 DIAGNOSIS — Z8619 Personal history of other infectious and parasitic diseases: Secondary | ICD-10-CM | POA: Diagnosis not present

## 2018-05-12 ENCOUNTER — Other Ambulatory Visit: Payer: Self-pay | Admitting: Internal Medicine

## 2018-05-12 DIAGNOSIS — Z1231 Encounter for screening mammogram for malignant neoplasm of breast: Secondary | ICD-10-CM

## 2018-05-26 DIAGNOSIS — D649 Anemia, unspecified: Secondary | ICD-10-CM | POA: Diagnosis not present

## 2018-05-26 DIAGNOSIS — R8290 Unspecified abnormal findings in urine: Secondary | ICD-10-CM | POA: Diagnosis not present

## 2018-05-26 DIAGNOSIS — R634 Abnormal weight loss: Secondary | ICD-10-CM | POA: Diagnosis not present

## 2018-05-26 DIAGNOSIS — R197 Diarrhea, unspecified: Secondary | ICD-10-CM | POA: Diagnosis not present

## 2018-05-26 DIAGNOSIS — R7309 Other abnormal glucose: Secondary | ICD-10-CM | POA: Diagnosis not present

## 2018-05-26 DIAGNOSIS — R945 Abnormal results of liver function studies: Secondary | ICD-10-CM | POA: Diagnosis not present

## 2018-05-26 DIAGNOSIS — I10 Essential (primary) hypertension: Secondary | ICD-10-CM | POA: Diagnosis not present

## 2018-06-02 DIAGNOSIS — K523 Indeterminate colitis: Secondary | ICD-10-CM | POA: Diagnosis not present

## 2018-06-02 DIAGNOSIS — D649 Anemia, unspecified: Secondary | ICD-10-CM | POA: Diagnosis not present

## 2018-06-02 DIAGNOSIS — K529 Noninfective gastroenteritis and colitis, unspecified: Secondary | ICD-10-CM | POA: Diagnosis not present

## 2018-06-02 DIAGNOSIS — Z0000 Encounter for general adult medical examination without abnormal findings: Secondary | ICD-10-CM | POA: Diagnosis not present

## 2018-06-02 DIAGNOSIS — I10 Essential (primary) hypertension: Secondary | ICD-10-CM | POA: Diagnosis not present

## 2018-06-02 DIAGNOSIS — E7800 Pure hypercholesterolemia, unspecified: Secondary | ICD-10-CM | POA: Diagnosis not present

## 2018-06-06 DIAGNOSIS — Z8619 Personal history of other infectious and parasitic diseases: Secondary | ICD-10-CM | POA: Diagnosis not present

## 2018-06-06 DIAGNOSIS — K529 Noninfective gastroenteritis and colitis, unspecified: Secondary | ICD-10-CM | POA: Diagnosis not present

## 2018-06-07 ENCOUNTER — Other Ambulatory Visit
Admission: RE | Admit: 2018-06-07 | Discharge: 2018-06-07 | Disposition: A | Discharge status: Home / Self Care | Payer: PPO | Source: Ambulatory Visit | Attending: Unknown Physician Specialty | Admitting: Unknown Physician Specialty

## 2018-06-07 DIAGNOSIS — Z8619 Personal history of other infectious and parasitic diseases: Secondary | ICD-10-CM | POA: Diagnosis not present

## 2018-06-07 LAB — GASTROINTESTINAL PANEL BY PCR, STOOL (REPLACES STOOL CULTURE)

## 2018-06-07 LAB — C DIFFICILE QUICK SCREEN W PCR REFLEX
C Diff antigen: POSITIVE — AB
C Diff toxin: NEGATIVE

## 2018-06-07 LAB — CLOSTRIDIUM DIFFICILE BY PCR, REFLEXED: Toxigenic C. Difficile by PCR: POSITIVE — AB

## 2018-06-14 ENCOUNTER — Ambulatory Visit
Admission: RE | Admit: 2018-06-14 | Discharge: 2018-06-14 | Disposition: A | Discharge status: Home / Self Care | Payer: PPO | Source: Ambulatory Visit | Attending: Internal Medicine | Admitting: Internal Medicine

## 2018-06-14 DIAGNOSIS — Z1231 Encounter for screening mammogram for malignant neoplasm of breast: Secondary | ICD-10-CM | POA: Diagnosis not present

## 2018-06-14 IMAGING — MG MM DIGITAL SCREENING BILAT W/ TOMO W/ CAD
6 of 10 series · 6 of 30 positions shown · non-contrast
Comparison: Previous exam(s).

CLINICAL DATA: Screening.

EXAM:
DIGITAL SCREENING BILATERAL MAMMOGRAM WITH TOMO AND CAD

[R CC synth-2D]
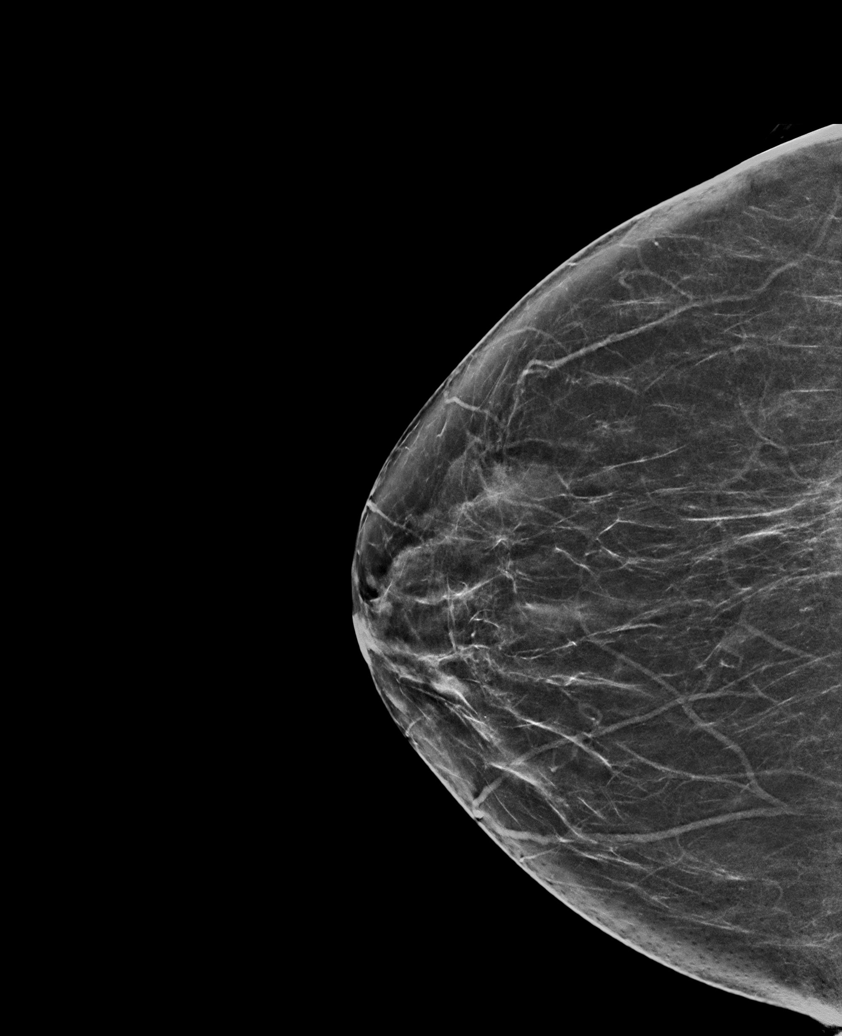

[R MLO synth-2D (1 of 2)]
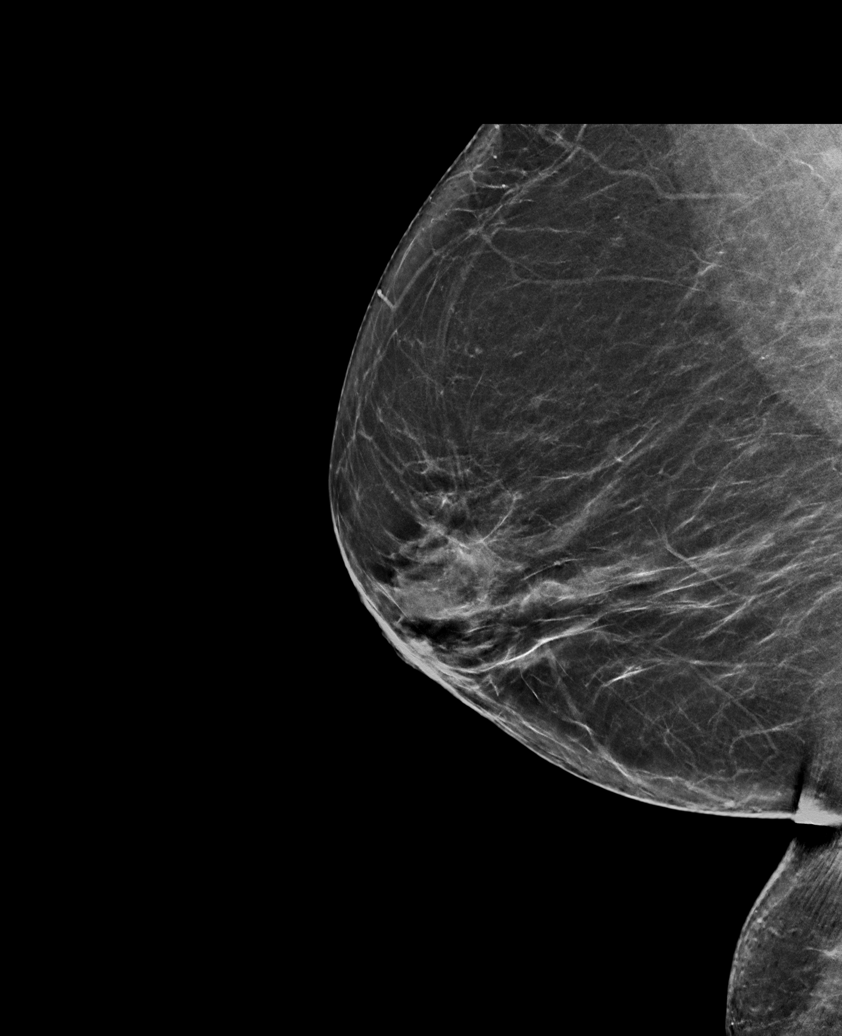

[L MLO synth-2D]
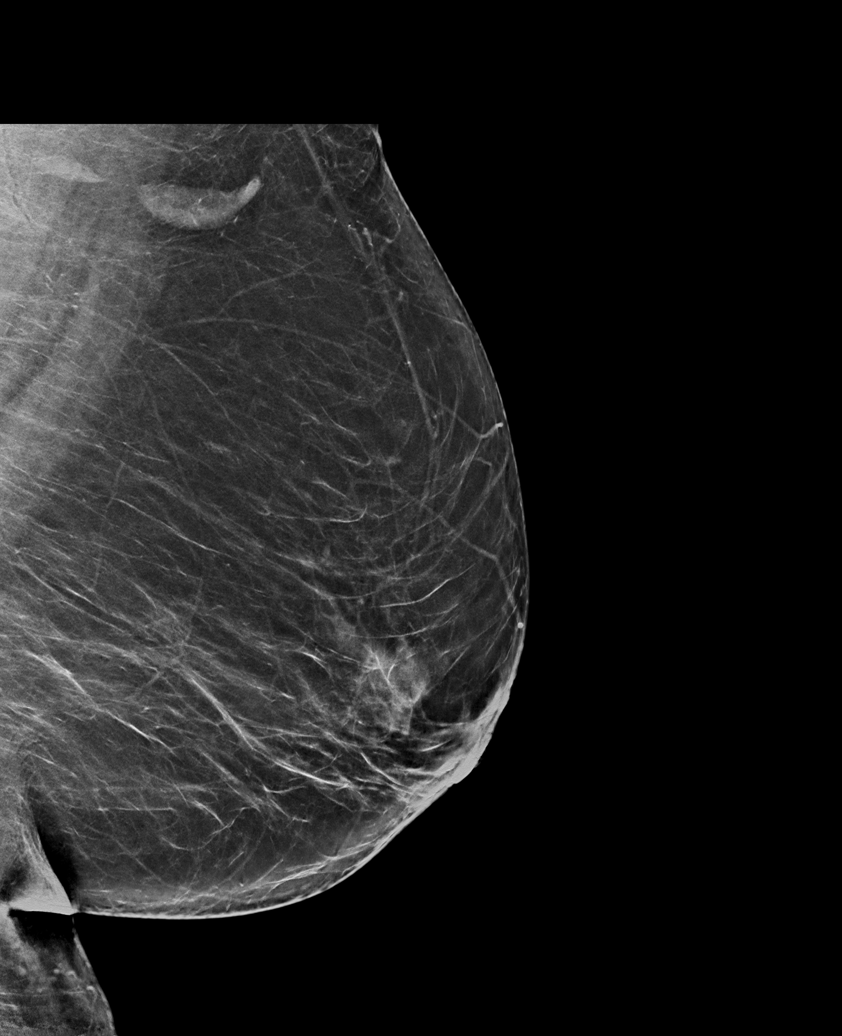

[R MLO synth-2D (2 of 2)]
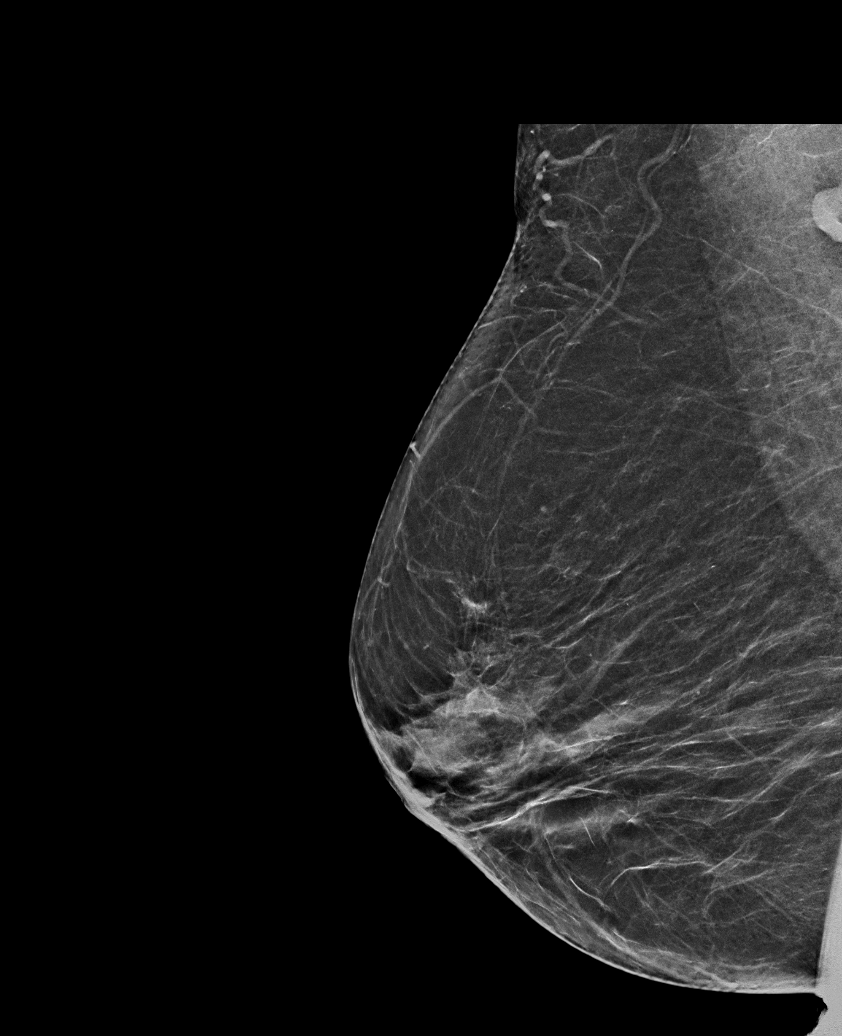

[L CC synth-2D]
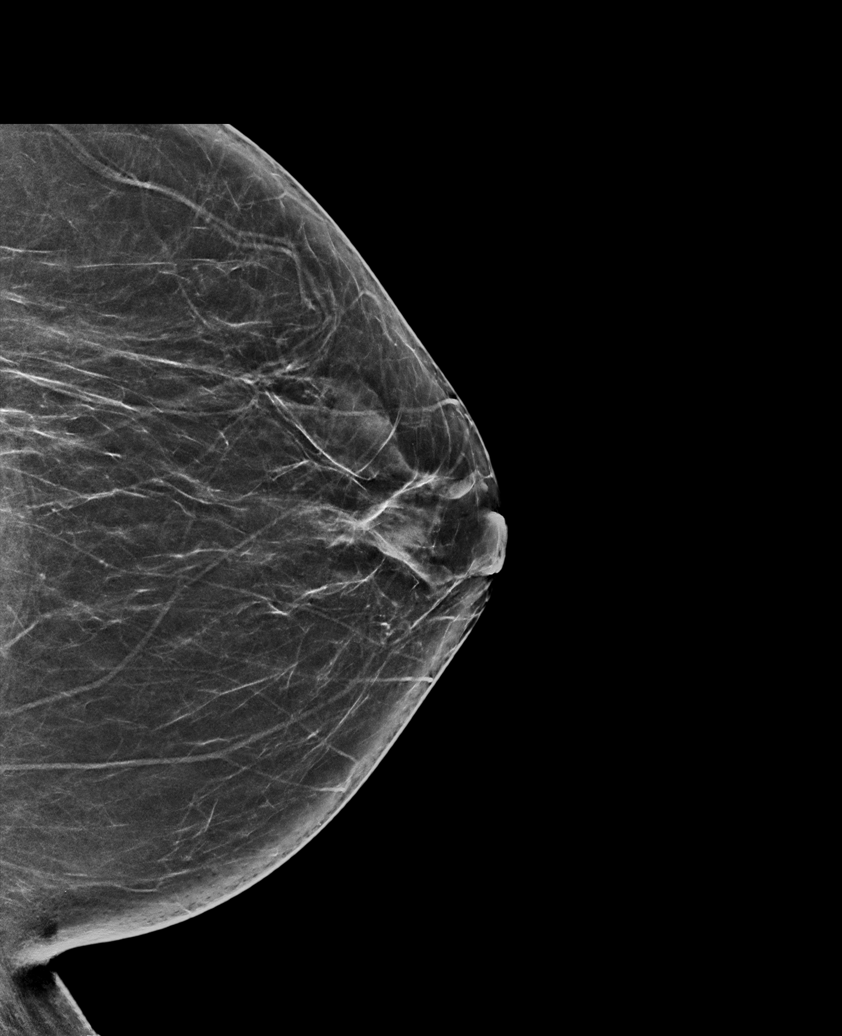

[R MLO tomo · tomo slice 32/63.0]
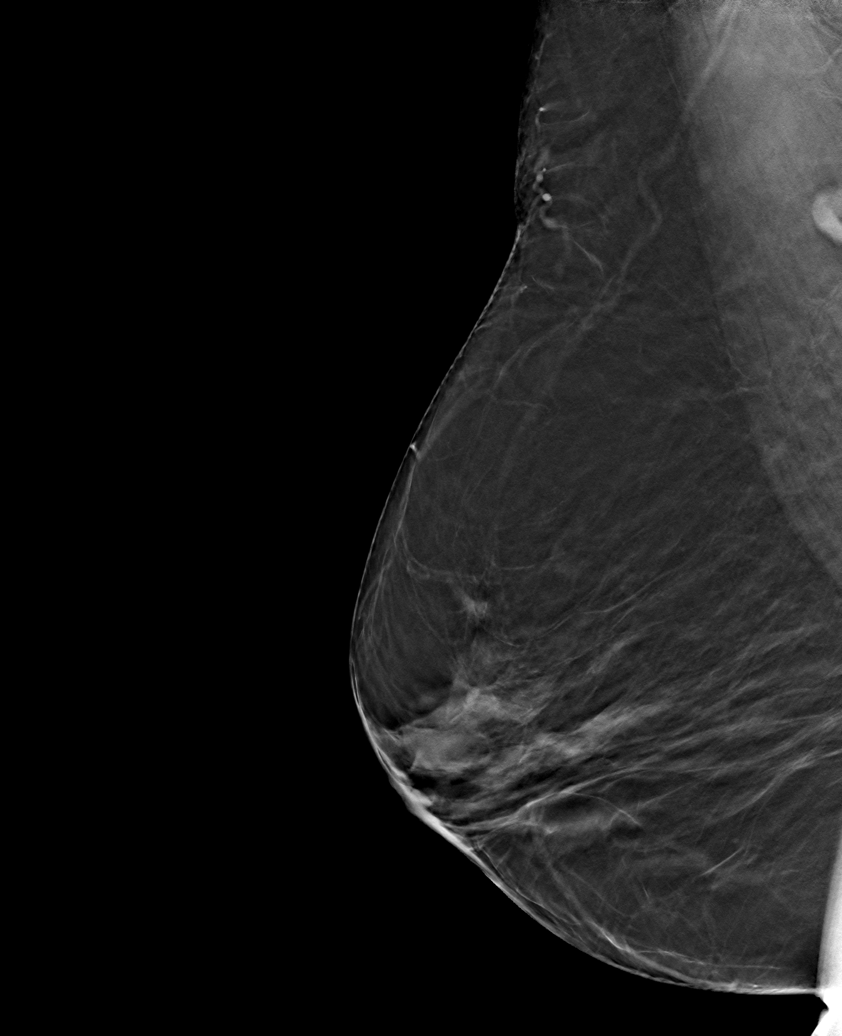

[6 of 30 positions shown; findings below may reference images not displayed]

ACR Breast Density Category b: There are scattered areas of
fibroglandular density.
FINDINGS: There are no findings suspicious for malignancy. Images were
processed with CAD.
IMPRESSION: No mammographic evidence of malignancy. A result letter of this
screening mammogram will be mailed directly to the patient.

RECOMMENDATION:
Screening mammogram in one year. (Code:[TQ])

BI-RADS CATEGORY  1: Negative.

## 2018-06-27 DIAGNOSIS — A0472 Enterocolitis due to Clostridium difficile, not specified as recurrent: Secondary | ICD-10-CM | POA: Diagnosis not present

## 2018-09-28 DIAGNOSIS — K523 Indeterminate colitis: Secondary | ICD-10-CM | POA: Diagnosis not present

## 2018-09-28 DIAGNOSIS — K529 Noninfective gastroenteritis and colitis, unspecified: Secondary | ICD-10-CM | POA: Diagnosis not present

## 2018-09-28 DIAGNOSIS — I10 Essential (primary) hypertension: Secondary | ICD-10-CM | POA: Diagnosis not present

## 2018-09-28 DIAGNOSIS — D649 Anemia, unspecified: Secondary | ICD-10-CM | POA: Diagnosis not present

## 2018-09-28 DIAGNOSIS — E7800 Pure hypercholesterolemia, unspecified: Secondary | ICD-10-CM | POA: Diagnosis not present

## 2018-10-05 DIAGNOSIS — E8809 Other disorders of plasma-protein metabolism, not elsewhere classified: Secondary | ICD-10-CM | POA: Diagnosis not present

## 2018-10-05 DIAGNOSIS — D649 Anemia, unspecified: Secondary | ICD-10-CM | POA: Diagnosis not present

## 2018-10-05 DIAGNOSIS — E7800 Pure hypercholesterolemia, unspecified: Secondary | ICD-10-CM | POA: Diagnosis not present

## 2018-10-05 DIAGNOSIS — Z23 Encounter for immunization: Secondary | ICD-10-CM | POA: Diagnosis not present

## 2018-10-05 DIAGNOSIS — R195 Other fecal abnormalities: Secondary | ICD-10-CM | POA: Diagnosis not present

## 2018-10-05 DIAGNOSIS — I10 Essential (primary) hypertension: Secondary | ICD-10-CM | POA: Diagnosis not present

## 2018-12-05 DIAGNOSIS — I10 Essential (primary) hypertension: Secondary | ICD-10-CM | POA: Diagnosis not present

## 2018-12-05 DIAGNOSIS — Z23 Encounter for immunization: Secondary | ICD-10-CM | POA: Diagnosis not present

## 2018-12-05 DIAGNOSIS — D649 Anemia, unspecified: Secondary | ICD-10-CM | POA: Diagnosis not present

## 2018-12-05 DIAGNOSIS — R195 Other fecal abnormalities: Secondary | ICD-10-CM | POA: Diagnosis not present

## 2018-12-05 DIAGNOSIS — R7309 Other abnormal glucose: Secondary | ICD-10-CM | POA: Diagnosis not present

## 2018-12-05 DIAGNOSIS — E8809 Other disorders of plasma-protein metabolism, not elsewhere classified: Secondary | ICD-10-CM | POA: Diagnosis not present

## 2018-12-05 DIAGNOSIS — E7800 Pure hypercholesterolemia, unspecified: Secondary | ICD-10-CM | POA: Diagnosis not present

## 2018-12-27 DIAGNOSIS — E7800 Pure hypercholesterolemia, unspecified: Secondary | ICD-10-CM | POA: Diagnosis not present

## 2018-12-27 DIAGNOSIS — Z23 Encounter for immunization: Secondary | ICD-10-CM | POA: Diagnosis not present

## 2018-12-27 DIAGNOSIS — D649 Anemia, unspecified: Secondary | ICD-10-CM | POA: Diagnosis not present

## 2018-12-27 DIAGNOSIS — I10 Essential (primary) hypertension: Secondary | ICD-10-CM | POA: Diagnosis not present

## 2018-12-27 DIAGNOSIS — E8809 Other disorders of plasma-protein metabolism, not elsewhere classified: Secondary | ICD-10-CM | POA: Diagnosis not present

## 2018-12-27 DIAGNOSIS — Z0000 Encounter for general adult medical examination without abnormal findings: Secondary | ICD-10-CM | POA: Diagnosis not present

## 2018-12-27 DIAGNOSIS — Z8619 Personal history of other infectious and parasitic diseases: Secondary | ICD-10-CM | POA: Diagnosis not present

## 2018-12-29 ENCOUNTER — Inpatient Hospital Stay: Payer: PPO | Attending: Internal Medicine | Admitting: Internal Medicine

## 2018-12-29 ENCOUNTER — Encounter: Payer: Self-pay | Admitting: Internal Medicine

## 2018-12-29 ENCOUNTER — Encounter (INDEPENDENT_AMBULATORY_CARE_PROVIDER_SITE_OTHER): Payer: Self-pay

## 2018-12-29 DIAGNOSIS — R5381 Other malaise: Secondary | ICD-10-CM | POA: Insufficient documentation

## 2018-12-29 DIAGNOSIS — R197 Diarrhea, unspecified: Secondary | ICD-10-CM | POA: Diagnosis not present

## 2018-12-29 DIAGNOSIS — R5383 Other fatigue: Secondary | ICD-10-CM | POA: Diagnosis not present

## 2018-12-29 DIAGNOSIS — Z79899 Other long term (current) drug therapy: Secondary | ICD-10-CM | POA: Diagnosis not present

## 2018-12-29 DIAGNOSIS — D649 Anemia, unspecified: Secondary | ICD-10-CM | POA: Insufficient documentation

## 2018-12-29 DIAGNOSIS — Z7982 Long term (current) use of aspirin: Secondary | ICD-10-CM | POA: Insufficient documentation

## 2018-12-29 DIAGNOSIS — I10 Essential (primary) hypertension: Secondary | ICD-10-CM | POA: Diagnosis not present

## 2018-12-29 DIAGNOSIS — D890 Polyclonal hypergammaglobulinemia: Secondary | ICD-10-CM | POA: Diagnosis not present

## 2018-12-29 NOTE — Assessment & Plan Note (Addendum)
#  Anemia-normocytic; between 9-10 since 2019.  Stable.  Likely secondary to chronic disease/inflammation-see below.  Patient had colonoscopies.  Iron studies not suggestive of iron deficiency.  # polyclonal gammopathy-IgG elevation; no concerns for multiple myeloma.  Reviewed at length with the patient.   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  ? fecal transplant.  Defer to GI.    Thank you Dr.Hande for allowing me to participate in the care of your pleasant patient. Please do not hesitate to contact me with questions or concerns in the interim.  # DISPOSITION: # follow up as needed-Dr.B  Cc; Dr.Elliot/hande

## 2018-12-29 NOTE — Progress Notes (Signed)
[facility] NOTE  Patient Care Team: Tracie Harrier, MD as PCP - General (Internal Medicine)  CHIEF COMPLAINTS/PURPOSE OF CONSULTATION:  ANEMIA/polyclonal gammopathy  #2018-Anemia hemoglobin 9-10/normocytic-second to chronic inflammation//C. 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 C.diff [Dr.Elliot; [place]]  No history exists.     HISTORY OF PRESENTING ILLNESS:  Christy Summers 68 y.o.  female patient has been referred to [place] for further evaluation of elevated protein/chronic anemia.  Patient states that she has been diagnosed with C. difficile for the last " 3 years".  She has been battling diarrhea for the last few years.  She had had multiple colonoscopies.  She has been through multiple rounds of oral vancomycin.   Currently  patient continues to have up to 3-5 loose stool a day.  She is interested in fecal transplant.  Patient denies any bone pain.  Chronic mild fatigue.  No weight loss.  No nausea no vomiting.   Review of Systems  Constitutional: Positive for malaise/fatigue. Negative for chills, diaphoresis, fever and weight loss.  HENT: Negative for nosebleeds and sore throat.   Eyes: Negative for double vision.  Respiratory: Negative for cough, hemoptysis, sputum production, shortness of breath and wheezing.   Cardiovascular: Negative for chest pain, palpitations, orthopnea and leg swelling.  Gastrointestinal: Positive for diarrhea. Negative for abdominal pain, blood in stool, constipation, heartburn, melena, nausea and vomiting.  Genitourinary: Negative for dysuria, frequency and urgency.  Musculoskeletal: Negative for back pain and joint pain.  Skin: Negative.  Negative for itching and rash.  Neurological: Negative for dizziness, tingling, focal weakness, weakness and headaches.  Endo/Heme/Allergies: Does not bruise/bleed easily.  Psychiatric/Behavioral: Negative for depression. The patient is not  nervous/anxious and does not have insomnia.      MEDICAL HISTORY:  Past Medical History:  Diagnosis Date  . Elevated lipids   . Hypertension     SURGICAL HISTORY: Past Surgical History:  Procedure Laterality Date  . CHOLECYSTECTOMY    . COLONOSCOPY WITH PROPOFOL N/A 09/07/2017   Procedure: COLONOSCOPY WITH PROPOFOL;  Surgeon: Toledo, Benay Pike, MD;  Location: [facility] ENDOSCOPY;  Service: Endoscopy;  Laterality: N/A;  . ESOPHAGOGASTRODUODENOSCOPY (EGD) WITH PROPOFOL N/A 09/07/2017   Procedure: ESOPHAGOGASTRODUODENOSCOPY (EGD) WITH PROPOFOL;  Surgeon: Toledo, Benay Pike, MD;  Location: [facility] ENDOSCOPY;  Service: Endoscopy;  Laterality: N/A;    SOCIAL HISTORY: Social History   Socioeconomic History  . Marital status: Divorced    Spouse name: Not on file  . Number of children: Not on file  . Years of education: Not on file  . Highest education level: Not on file  Occupational History  . Not on file  Social Needs  . Financial resource strain: Not on file  . Food insecurity:    Worry: Not on file    Inability: Not on file  . Transportation needs:    Medical: Not on file    Non-medical: Not on file  Tobacco Use  . Smoking status: Never Smoker  . Smokeless tobacco: Never Used  Substance and Sexual Activity  . Alcohol use: Not on file  . Drug use: Not on file  . Sexual activity: Not on file  Lifestyle  . Physical activity:    Days per week: Not on file    Minutes per session: Not on file  . Stress: Not on file  Relationships  . Social connections:    Talks on phone: Not on file    Gets together: Not on file  Attends religious service: Not on file    Active member of club or organization: Not on file    Attends meetings of clubs or organizations: Not on file    Relationship status: Not on file  . Intimate partner violence:    Fear of current or ex partner: Not on file    Emotionally abused: Not on file    Physically abused: Not on file    Forced sexual activity: Not on  file  Other Topics Concern  . Not on file  Social History Narrative   She is retired from [employer] with family in [place].  No smoking no alcohol.    FAMILY HISTORY: Family History  Problem Relation Age of Onset  . Breast cancer Neg Hx     ALLERGIES:  has No Known Allergies.  MEDICATIONS:  Current Outpatient Medications  Medication Sig Dispense Refill  . amLODipine (NORVASC) 10 MG tablet Take 10 mg by mouth daily.    Marland Kitchen aspirin 81 MG chewable tablet Chew 81 mg by mouth daily.    . cyanocobalamin 1000 MCG tablet Take 1,000 mcg by mouth daily.    . iron polysaccharides (NIFEREX) 150 MG capsule Take by mouth.    . losartan-hydrochlorothiazide (HYZAAR) 100-25 MG tablet Take 1 tablet by mouth daily.    . potassium chloride (K-DUR) 10 MEQ tablet Take 10 mEq by mouth daily.    . Ascorbic Acid (VITAMIN C) 500 MG CAPS Take by mouth.    . calcium carbonate (TUMS) 500 MG chewable tablet Chew by mouth.    . Cholecalciferol (VITAMIN D-1000 MAX ST) 25 MCG (1000 UT) tablet Take by mouth.     No current facility-administered medications for this visit.       Marland Kitchen  PHYSICAL EXAMINATION: ECOG PERFORMANCE STATUS: 1 - Symptomatic but completely ambulatory  Vitals:   12/29/18 1120  BP: (!) 144/77  Pulse: 85  Resp: 16  Temp: 97.9 F (36.6 C)   Filed Weights   12/29/18 1120  Weight: 212 lb (96.2 kg)    Physical Exam   LABORATORY DATA:  I have reviewed the data as listed No results found for: WBC, HGB, HCT, MCV, PLT Recent Labs    03/09/18 1320  CREATININE 1.80*    RADIOGRAPHIC STUDIES: I have personally reviewed the radiological images as listed and agreed with the findings in the report. No results found.  ASSESSMENT & PLAN:   Normocytic anemia # Anemia-normocytic; between 9-10 since 2019.  Stable.  Likely secondary to chronic disease/inflammation-see below.  Patient had colonoscopies.  Iron studies not suggestive of iron deficiency.  # polyclonal gammopathy-IgG  elevation; no concerns for multiple myeloma.  Reviewed at length with the patient.   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  ? fecal transplant.  Defer to GI.    Thank you Dr.Hande for allowing me to participate in the care of your pleasant patient. Please do not hesitate to contact me with questions or concerns in the interim.  # DISPOSITION: # follow up as needed-Dr.B  Cc; Dr.Elliot/hande   All questions were answered. The patient knows to call the clinic with any problems, questions or concerns.    Cammie Sickle, MD 12/29/2018 1:27 PM

## 2018-12-30 ENCOUNTER — Other Ambulatory Visit
Admission: RE | Admit: 2018-12-30 | Discharge: 2018-12-30 | Disposition: A | Discharge status: Home / Self Care | Payer: PPO | Source: Ambulatory Visit | Attending: Unknown Physician Specialty | Admitting: Unknown Physician Specialty

## 2018-12-30 DIAGNOSIS — Z8619 Personal history of other infectious and parasitic diseases: Secondary | ICD-10-CM | POA: Diagnosis not present

## 2018-12-30 LAB — GASTROINTESTINAL PANEL BY PCR, STOOL (REPLACES STOOL CULTURE)

## 2018-12-30 LAB — C DIFFICILE QUICK SCREEN W PCR REFLEX
C Diff antigen: NEGATIVE
C Diff interpretation: NOT DETECTED
C Diff toxin: NEGATIVE

## 2019-01-08 DIAGNOSIS — Z8619 Personal history of other infectious and parasitic diseases: Secondary | ICD-10-CM | POA: Diagnosis not present

## 2019-01-08 DIAGNOSIS — R197 Diarrhea, unspecified: Secondary | ICD-10-CM | POA: Diagnosis not present

## 2019-01-09 DIAGNOSIS — Z8619 Personal history of other infectious and parasitic diseases: Secondary | ICD-10-CM | POA: Diagnosis not present

## 2019-01-09 DIAGNOSIS — R197 Diarrhea, unspecified: Secondary | ICD-10-CM | POA: Diagnosis not present

## 2019-01-10 DIAGNOSIS — R197 Diarrhea, unspecified: Secondary | ICD-10-CM | POA: Diagnosis not present

## 2019-01-11 ENCOUNTER — Telehealth: Payer: Self-pay | Admitting: *Deleted

## 2019-01-11 NOTE — Telephone Encounter (Signed)
Patient calling to schedule an apt with Dr. Rogue Bussing. Stated that gi is referring her back to Dr. B for abnormal labs. Negative GI workup. Dr. Jacinto Reap - is it ok to schedule again in a few weeks?  Dr. Percell Boston notes  "She has had elevated gamma globulin of 2.9 and IgG of 3329, which is twice normal. IgA was 357, somewhat elevated. The sed rate was down a little from 106 to 96. Alk phos was 168.  I explained to her that what she has is very unusual and she likely needs to see Heme-Onc doctors for further evaluation."

## 2019-01-17 NOTE — Telephone Encounter (Signed)
Pt sch. For 01/30/2019 with Dr. Jacinto Reap

## 2019-01-22 ENCOUNTER — Ambulatory Visit
Admission: RE | Admit: 2019-01-22 | Discharge: 2019-01-22 | Disposition: A | Discharge status: Home / Self Care | Payer: PPO | Attending: Unknown Physician Specialty | Admitting: Unknown Physician Specialty

## 2019-01-22 ENCOUNTER — Ambulatory Visit: Payer: PPO | Admitting: Anesthesiology

## 2019-01-22 ENCOUNTER — Encounter
Admission: RE | Disposition: A | Discharge status: Home / Self Care | Payer: Self-pay | Source: Home / Self Care | Attending: Unknown Physician Specialty

## 2019-01-22 ENCOUNTER — Encounter: Payer: Self-pay | Admitting: Anesthesiology

## 2019-01-22 DIAGNOSIS — K630 Abscess of intestine: Secondary | ICD-10-CM | POA: Diagnosis not present

## 2019-01-22 DIAGNOSIS — K6389 Other specified diseases of intestine: Secondary | ICD-10-CM | POA: Diagnosis not present

## 2019-01-22 DIAGNOSIS — Z79899 Other long term (current) drug therapy: Secondary | ICD-10-CM | POA: Diagnosis not present

## 2019-01-22 DIAGNOSIS — Z7982 Long term (current) use of aspirin: Secondary | ICD-10-CM | POA: Insufficient documentation

## 2019-01-22 DIAGNOSIS — I10 Essential (primary) hypertension: Secondary | ICD-10-CM | POA: Diagnosis not present

## 2019-01-22 DIAGNOSIS — K6289 Other specified diseases of anus and rectum: Secondary | ICD-10-CM | POA: Insufficient documentation

## 2019-01-22 DIAGNOSIS — K529 Noninfective gastroenteritis and colitis, unspecified: Secondary | ICD-10-CM | POA: Diagnosis not present

## 2019-01-22 DIAGNOSIS — K5289 Other specified noninfective gastroenteritis and colitis: Secondary | ICD-10-CM | POA: Diagnosis not present

## 2019-01-22 DIAGNOSIS — R933 Abnormal findings on diagnostic imaging of other parts of digestive tract: Secondary | ICD-10-CM | POA: Diagnosis not present

## 2019-01-22 DIAGNOSIS — R197 Diarrhea, unspecified: Secondary | ICD-10-CM | POA: Diagnosis not present

## 2019-01-22 HISTORY — PX: COLONOSCOPY WITH PROPOFOL: SHX5780

## 2019-01-22 LAB — C DIFFICILE QUICK SCREEN W PCR REFLEX
C Diff antigen: NEGATIVE
C Diff interpretation: NOT DETECTED
C Diff toxin: NEGATIVE

## 2019-01-22 SURGERY — COLONOSCOPY WITH PROPOFOL
Anesthesia: General

## 2019-01-22 MED ORDER — EPHEDRINE SULFATE 50 MG/ML IJ SOLN
INTRAMUSCULAR | Status: DC | PRN
  Administered 2019-01-22 (×2): 5 mg via INTRAVENOUS
  Administered 2019-01-22: 10 mg via INTRAVENOUS
  Administered 2019-01-22: 5 mg via INTRAVENOUS

## 2019-01-22 MED ORDER — PROPOFOL 500 MG/50ML IV EMUL
INTRAVENOUS | Status: AC
  Filled 2019-01-22: qty 50

## 2019-01-22 MED ORDER — MIDAZOLAM HCL 2 MG/2ML IJ SOLN
INTRAMUSCULAR | Status: AC
  Filled 2019-01-22: qty 2

## 2019-01-22 MED ORDER — FENTANYL CITRATE (PF) 100 MCG/2ML IJ SOLN
INTRAMUSCULAR | Status: AC
  Filled 2019-01-22: qty 2

## 2019-01-22 MED ORDER — FENTANYL CITRATE (PF) 100 MCG/2ML IJ SOLN
INTRAMUSCULAR | Status: DC | PRN
  Administered 2019-01-22: 50 ug via INTRAVENOUS

## 2019-01-22 MED ORDER — PROPOFOL 500 MG/50ML IV EMUL
INTRAVENOUS | Status: DC | PRN
  Administered 2019-01-22: 120 ug/kg/min via INTRAVENOUS

## 2019-01-22 MED ORDER — EPHEDRINE SULFATE 50 MG/ML IJ SOLN
INTRAMUSCULAR | Status: AC
  Filled 2019-01-22: qty 1

## 2019-01-22 MED ORDER — MIDAZOLAM HCL 2 MG/2ML IJ SOLN
INTRAMUSCULAR | Status: DC | PRN
  Administered 2019-01-22: 2 mg via INTRAVENOUS

## 2019-01-22 MED ORDER — SODIUM CHLORIDE 0.9 % IV SOLN
INTRAVENOUS | Status: DC
  Administered 2019-01-22: 08:00:00 via INTRAVENOUS

## 2019-01-22 MED ORDER — SODIUM CHLORIDE 0.9 % IV SOLN
INTRAVENOUS | Status: DC
  Administered 2019-01-22: 09:00:00 via INTRAVENOUS

## 2019-01-22 NOTE — Anesthesia Procedure Notes (Signed)
Performed by: Cook-Martin, Nailah Luepke Pre-anesthesia Checklist: Patient identified, Emergency Drugs available, Suction available, Patient being monitored and Timeout performed Patient Re-evaluated:Patient Re-evaluated prior to induction Oxygen Delivery Method: Nasal cannula Preoxygenation: Pre-oxygenation with 100% oxygen Induction Type: IV induction Placement Confirmation: positive ETCO2 and CO2 detector       

## 2019-01-22 NOTE — Transfer of Care (Signed)
   Immediate Anesthesia Transfer of Care Note  Patient: Christy Summers  Procedure(s) Performed: COLONOSCOPY WITH PROPOFOL (N/A )  Patient Location: PACU  Anesthesia Type:General  Level of Consciousness: awake and sedated  Airway & Oxygen Therapy: Patient Spontanous Breathing and Patient connected to nasal cannula oxygen  Post-op Assessment: Report given to RN and Post -op Vital signs reviewed and stable  Post vital signs: Reviewed and stable  Last Vitals:  Vitals Value Taken Time  BP    Temp    Pulse    Resp    SpO2      Last Pain:  Vitals:   01/22/19 0821  TempSrc: Tympanic         Complications: No apparent anesthesia complications

## 2019-01-22 NOTE — Anesthesia Post-op Follow-up Note (Signed)
Anesthesia QCDR form completed.        

## 2019-01-22 NOTE — Anesthesia Preprocedure Evaluation (Signed)
Anesthesia Evaluation  Patient identified by MRN, date of birth, ID band Patient awake    Reviewed: Allergy & Precautions, H&P , NPO status , Patient's Chart, lab work & pertinent test results, reviewed documented beta blocker date and time   Airway Mallampati: II   Neck ROM: full    Dental  (+) Poor Dentition   Pulmonary neg pulmonary ROS,    Pulmonary exam normal        Cardiovascular Exercise Tolerance: Good hypertension, On Medications negative cardio ROS Normal cardiovascular exam Rhythm:regular Rate:Normal     Neuro/Psych negative neurological ROS  negative psych ROS   GI/Hepatic negative GI ROS, Neg liver ROS,   Endo/Other  negative endocrine ROS  Renal/GU negative Renal ROS  negative genitourinary   Musculoskeletal   Abdominal   Peds  Hematology  (+) Blood dyscrasia, anemia ,   Anesthesia Other Findings Past Medical History: No date: Elevated lipids No date: Hypertension Past Surgical History: No date: CHOLECYSTECTOMY 09/07/2017: COLONOSCOPY WITH PROPOFOL; N/A     Comment:  Procedure: COLONOSCOPY WITH PROPOFOL;  Surgeon: Toledo,               Benay Pike, MD;  Location: [facility] ENDOSCOPY;  Service:               Endoscopy;  Laterality: N/A; 09/07/2017: ESOPHAGOGASTRODUODENOSCOPY (EGD) WITH PROPOFOL; N/A     Comment:  Procedure: ESOPHAGOGASTRODUODENOSCOPY (EGD) WITH               PROPOFOL;  Surgeon: Toledo, Benay Pike, MD;  Location:               [facility] ENDOSCOPY;  Service: Endoscopy;  Laterality: N/A;   Reproductive/Obstetrics negative OB ROS                             Anesthesia Physical Anesthesia Plan  ASA: II  Anesthesia Plan: General   Post-op Pain Management:    Induction:   PONV Risk Score and Plan:   Airway Management Planned:   Additional Equipment:   Intra-op Plan:   Post-operative Plan:   Informed Consent: I have reviewed the patients History and  Physical, chart, labs and discussed the procedure including the risks, benefits and alternatives for the proposed anesthesia with the patient or authorized representative who has indicated his/her understanding and acceptance.     Dental Advisory Given  Plan Discussed with: CRNA  Anesthesia Plan Comments:         Anesthesia Quick Evaluation

## 2019-01-22 NOTE — H&P (Signed)
Primary Care Physician:  Tracie Harrier, MD Primary Gastroenterologist:  Dr. Vira Agar  Pre-Procedure History & Physical: HPI:  Christy Summers is a 68 y.o. female is here for an colonoscopy.   Past Medical History:  Diagnosis Date  . Elevated lipids   . Hypertension     Past Surgical History:  Procedure Laterality Date  . CHOLECYSTECTOMY    . COLONOSCOPY WITH PROPOFOL N/A 09/07/2017   Procedure: COLONOSCOPY WITH PROPOFOL;  Surgeon: Toledo, Benay Pike, MD;  Location: [facility] ENDOSCOPY;  Service: Endoscopy;  Laterality: N/A;  . ESOPHAGOGASTRODUODENOSCOPY (EGD) WITH PROPOFOL N/A 09/07/2017   Procedure: ESOPHAGOGASTRODUODENOSCOPY (EGD) WITH PROPOFOL;  Surgeon: Toledo, Benay Pike, MD;  Location: [facility] ENDOSCOPY;  Service: Endoscopy;  Laterality: N/A;    Prior to Admission medications   Medication Sig Start Date End Date Taking? Authorizing Provider  amLODipine (NORVASC) 10 MG tablet Take 10 mg by mouth daily.   Yes [provider]  aspirin 81 MG chewable tablet Chew 81 mg by mouth daily.   Yes [provider]  iron polysaccharides (NIFEREX) 150 MG capsule Take by mouth. 12/27/18 12/27/19 Yes [provider]  losartan-hydrochlorothiazide (HYZAAR) 100-25 MG tablet Take 1 tablet by mouth daily.   Yes [provider]  potassium chloride (K-DUR) 10 MEQ tablet Take 10 mEq by mouth daily.   Yes [provider]  Probiotic Product (ALIGN PO) Take by mouth daily.   Yes [provider]  Ascorbic Acid (VITAMIN C) 500 MG CAPS Take by mouth.    [provider]  calcium carbonate (TUMS) 500 MG chewable tablet Chew by mouth.    [provider]  Cholecalciferol (VITAMIN D-1000 MAX ST) 25 MCG (1000 UT) tablet Take by mouth.    [provider]  cyanocobalamin 1000 MCG tablet Take 1,000 mcg by mouth daily.    [provider]    Allergies as of 01/17/2019  . (No Known Allergies)    Family History  Problem Relation Age  of Onset  . Breast cancer Neg Hx     Social History   Socioeconomic History  . Marital status: Divorced    Spouse name: Not on file  . Number of children: Not on file  . Years of education: Not on file  . Highest education level: Not on file  Occupational History  . Not on file  Social Needs  . Financial resource strain: Not on file  . Food insecurity:    Worry: Not on file    Inability: Not on file  . Transportation needs:    Medical: Not on file    Non-medical: Not on file  Tobacco Use  . Smoking status: Never Smoker  . Smokeless tobacco: Never Used  Substance and Sexual Activity  . Alcohol use: Not Currently  . Drug use: Not Currently  . Sexual activity: Not on file  Lifestyle  . Physical activity:    Days per week: Not on file    Minutes per session: Not on file  . Stress: Not on file  Relationships  . Social connections:    Talks on phone: Not on file    Gets together: Not on file    Attends religious service: Not on file    Active member of club or organization: Not on file    Attends meetings of clubs or organizations: Not on file    Relationship status: Not on file  . Intimate partner violence:    Fear of current or ex partner: Not on file  Emotionally abused: Not on file    Physically abused: Not on file    Forced sexual activity: Not on file  Other Topics Concern  . Not on file  Social History Narrative   She is retired from [employer] with family in [place].  No smoking no alcohol.    Review of Systems: See HPI, otherwise negative ROS  Physical Exam: There were no vitals taken for this visit. General:   Alert,  pleasant and cooperative in NAD Head:  Normocephalic and atraumatic. Neck:  Supple; no masses or thyromegaly. Lungs:  Clear throughout to auscultation.    Heart:  Regular rate and rhythm. Abdomen:  Soft, nontender and nondistended. Normal bowel sounds, without guarding, and without rebound.   Neurologic:  Alert and  oriented  x4;  grossly normal neurologically.  Impression/Plan: Christy Summers is here for an colonoscopy to be performed for diarrhea, elevated blood tests of IgG  Over 3732, calprotectin level of 946 (normal of up to 120, sed rate of 106.  Previous colonoscopy with biopsies showing severe active colitis.  Risks, benefits, limitations, and alternatives regarding  colonoscopy have been reviewed with the patient.  Questions have been answered.  All parties agreeable.   Gaylyn Cheers, MD  01/22/2019, 8:21 AM

## 2019-01-22 NOTE — Op Note (Signed)
[facility] Gastroenterology Patient Name: Christy Summers Procedure Date: 01/22/2019 8:32 AM MRN: [id number] Account #: [id number] Date of Birth: 02/17/1951 Admit Type: Outpatient Age: 68 Room: [facility] ENDO ROOM 1 Gender: Female Note Status: Finalized Procedure:            Colonoscopy Indications:          Clinically significant diarrhea of unexplained origin,                        Chronic diarrhea Providers:            Manya Silvas, MD Referring MD:         Tracie Harrier, MD (Referring MD) Medicines:            Propofol per Anesthesia Complications:        No immediate complications. Procedure:            Pre-Anesthesia Assessment:                       - After reviewing the risks and benefits, the patient                        was deemed in satisfactory condition to undergo the                        procedure.                       After obtaining informed consent, the colonoscope was                        passed under direct vision. Throughout the procedure,                        the patient's blood pressure, pulse, and oxygen                        saturations were monitored continuously. The                        Colonoscope was introduced through the anus and                        advanced to the the cecum, identified by appendiceal                        orifice and ileocecal valve. The colonoscopy was                        performed without difficulty. The patient tolerated the                        procedure well. The quality of the bowel preparation                        was good. Findings:      The entire colon was inflammed with loss of vascular pattern and had       mucus on the colon.      biopsies done of cecum, ascending, transverse, descending, sigmoid, and       rectum to check for microscopic colitis which may mean  C. diff colitis       or colitis of other causes. The smell of the colon suggests C. diff.      It is possible  that she has ulcerative colitis with C. diff over       infection.      Diffuse moderate inflammation characterized by granularity and loss of       vascularity was found in the entire colon. Impression:           - No specimens collected. Recommendation:       - Await pathology results. Manya Silvas, MD 01/22/2019 9:05:32 AM This report has been signed electronically. Number of Addenda: 0 Note Initiated On: 01/22/2019 8:32 AM Scope Withdrawal Time: 0 hours 9 minutes 50 seconds  Total Procedure Duration: 0 hours 15 minutes 11 seconds       [facility]

## 2019-01-24 ENCOUNTER — Encounter: Payer: Self-pay | Admitting: Unknown Physician Specialty

## 2019-01-24 LAB — SURGICAL PATHOLOGY

## 2019-01-25 ENCOUNTER — Telehealth: Payer: Self-pay | Admitting: *Deleted

## 2019-01-25 NOTE — Telephone Encounter (Signed)
Patient called on 01/25/19 and left a message saying that she wanted CANCEL her 01/30/19 MD appt.  She said that she would call back later to R/S

## 2019-01-26 NOTE — Anesthesia Postprocedure Evaluation (Signed)
Anesthesia Post Note  Patient: Christy Summers  Procedure(s) Performed: COLONOSCOPY WITH PROPOFOL (N/A )  Patient location during evaluation: PACU Anesthesia Type: General Level of consciousness: awake and alert Pain management: pain level controlled Vital Signs Assessment: post-procedure vital signs reviewed and stable Respiratory status: spontaneous breathing, nonlabored ventilation, respiratory function stable and patient connected to nasal cannula oxygen Cardiovascular status: blood pressure returned to baseline and stable Postop Assessment: no apparent nausea or vomiting Anesthetic complications: no     Last Vitals:  Vitals:   01/22/19 0920 01/22/19 0930  BP: 100/60 98/60  Pulse:  (!) 161  Resp:  (!) 23  Temp:    SpO2:  99%    Last Pain:  Vitals:   01/23/19 0748  TempSrc:   PainSc: 0-No pain                 [name]

## 2019-01-30 ENCOUNTER — Ambulatory Visit: Payer: PPO | Admitting: Internal Medicine

## 2019-02-21 DIAGNOSIS — Z8619 Personal history of other infectious and parasitic diseases: Secondary | ICD-10-CM | POA: Diagnosis not present

## 2019-02-21 DIAGNOSIS — R899 Unspecified abnormal finding in specimens from other organs, systems and tissues: Secondary | ICD-10-CM | POA: Diagnosis not present

## 2019-02-21 DIAGNOSIS — R197 Diarrhea, unspecified: Secondary | ICD-10-CM | POA: Diagnosis not present

## 2019-02-22 ENCOUNTER — Other Ambulatory Visit
Admission: RE | Admit: 2019-02-22 | Discharge: 2019-02-22 | Disposition: A | Discharge status: Home / Self Care | Payer: PPO | Source: Ambulatory Visit | Attending: Unknown Physician Specialty | Admitting: Unknown Physician Specialty

## 2019-02-22 DIAGNOSIS — R899 Unspecified abnormal finding in specimens from other organs, systems and tissues: Secondary | ICD-10-CM | POA: Diagnosis not present

## 2019-02-22 DIAGNOSIS — Z8619 Personal history of other infectious and parasitic diseases: Secondary | ICD-10-CM | POA: Diagnosis not present

## 2019-02-22 DIAGNOSIS — R197 Diarrhea, unspecified: Secondary | ICD-10-CM | POA: Diagnosis not present

## 2019-02-23 LAB — CALPROTECTIN, FECAL: Calprotectin, Fecal: 775 ug/g — ABNORMAL HIGH (ref 0–120)

## 2019-04-04 DIAGNOSIS — R197 Diarrhea, unspecified: Secondary | ICD-10-CM | POA: Diagnosis not present

## 2019-04-04 DIAGNOSIS — R899 Unspecified abnormal finding in specimens from other organs, systems and tissues: Secondary | ICD-10-CM | POA: Diagnosis not present

## 2019-04-24 DIAGNOSIS — R7309 Other abnormal glucose: Secondary | ICD-10-CM | POA: Diagnosis not present

## 2019-04-24 DIAGNOSIS — E7800 Pure hypercholesterolemia, unspecified: Secondary | ICD-10-CM | POA: Diagnosis not present

## 2019-04-24 DIAGNOSIS — I10 Essential (primary) hypertension: Secondary | ICD-10-CM | POA: Diagnosis not present

## 2019-04-24 DIAGNOSIS — D649 Anemia, unspecified: Secondary | ICD-10-CM | POA: Diagnosis not present

## 2019-05-01 DIAGNOSIS — I10 Essential (primary) hypertension: Secondary | ICD-10-CM | POA: Diagnosis not present

## 2019-05-01 DIAGNOSIS — Z8619 Personal history of other infectious and parasitic diseases: Secondary | ICD-10-CM | POA: Diagnosis not present

## 2019-05-01 DIAGNOSIS — D649 Anemia, unspecified: Secondary | ICD-10-CM | POA: Diagnosis not present

## 2019-05-01 DIAGNOSIS — E7800 Pure hypercholesterolemia, unspecified: Secondary | ICD-10-CM | POA: Diagnosis not present

## 2019-05-09 ENCOUNTER — Other Ambulatory Visit: Payer: Self-pay | Admitting: Internal Medicine

## 2019-05-09 DIAGNOSIS — Z1231 Encounter for screening mammogram for malignant neoplasm of breast: Secondary | ICD-10-CM

## 2019-06-18 ENCOUNTER — Ambulatory Visit
Admission: RE | Admit: 2019-06-18 | Discharge: 2019-06-18 | Disposition: A | Discharge status: Home / Self Care | Payer: PPO | Source: Ambulatory Visit | Attending: Internal Medicine | Admitting: Internal Medicine

## 2019-06-18 ENCOUNTER — Other Ambulatory Visit: Payer: Self-pay

## 2019-06-18 DIAGNOSIS — Z1231 Encounter for screening mammogram for malignant neoplasm of breast: Secondary | ICD-10-CM | POA: Insufficient documentation

## 2019-06-18 IMAGING — MG DIGITAL SCREENING BILATERAL MAMMOGRAM WITH TOMO AND CAD
8 series · 8 of 24 positions shown · non-contrast
Comparison: Previous exam(s).

CLINICAL DATA: Screening.

EXAM:
DIGITAL SCREENING BILATERAL MAMMOGRAM WITH TOMO AND CAD

[L CC synth-2D]
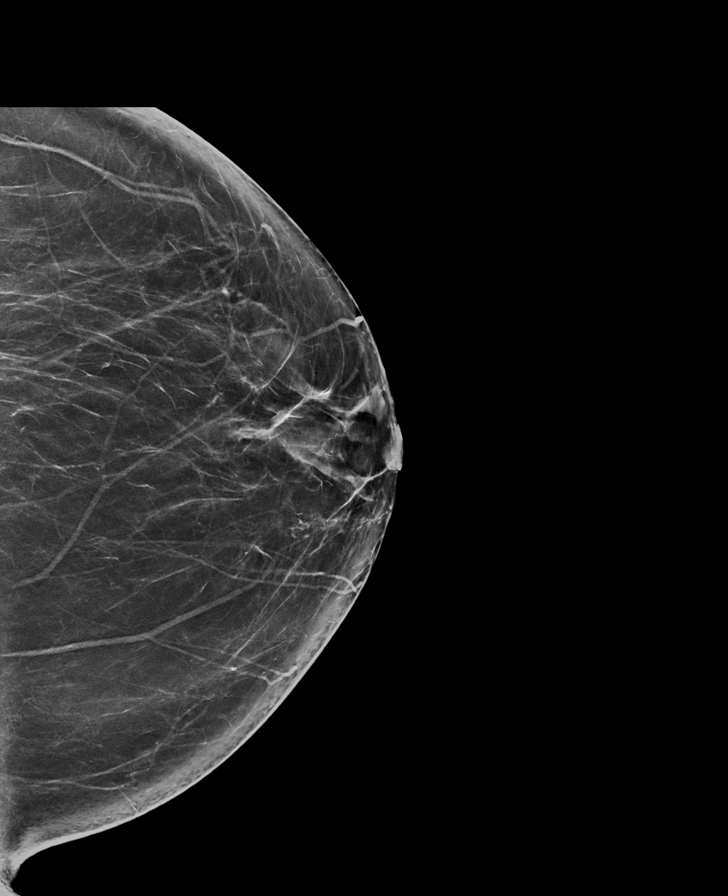

[R CC synth-2D]
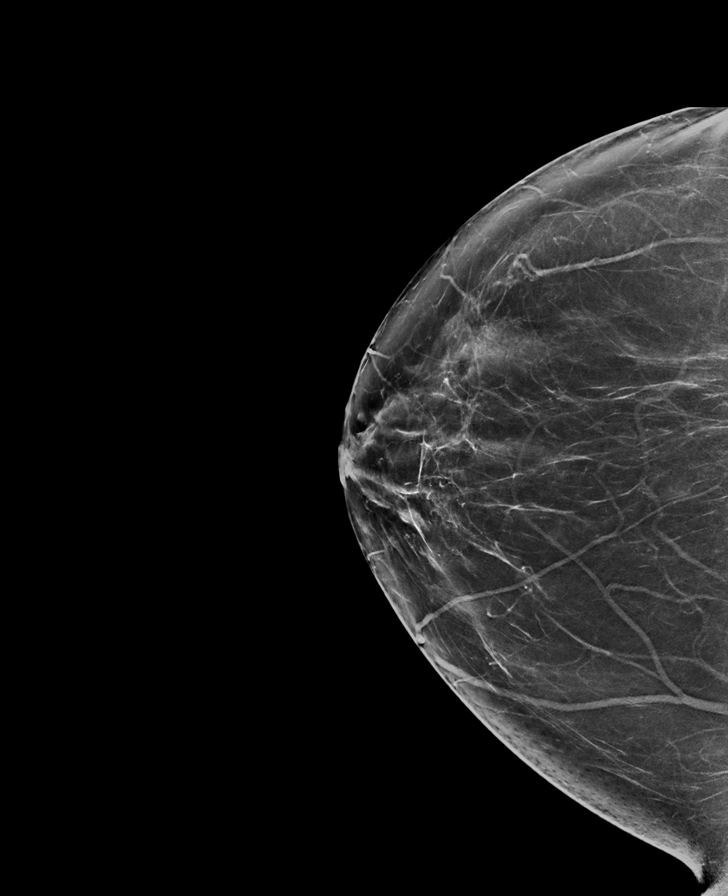

[L MLO synth-2D]
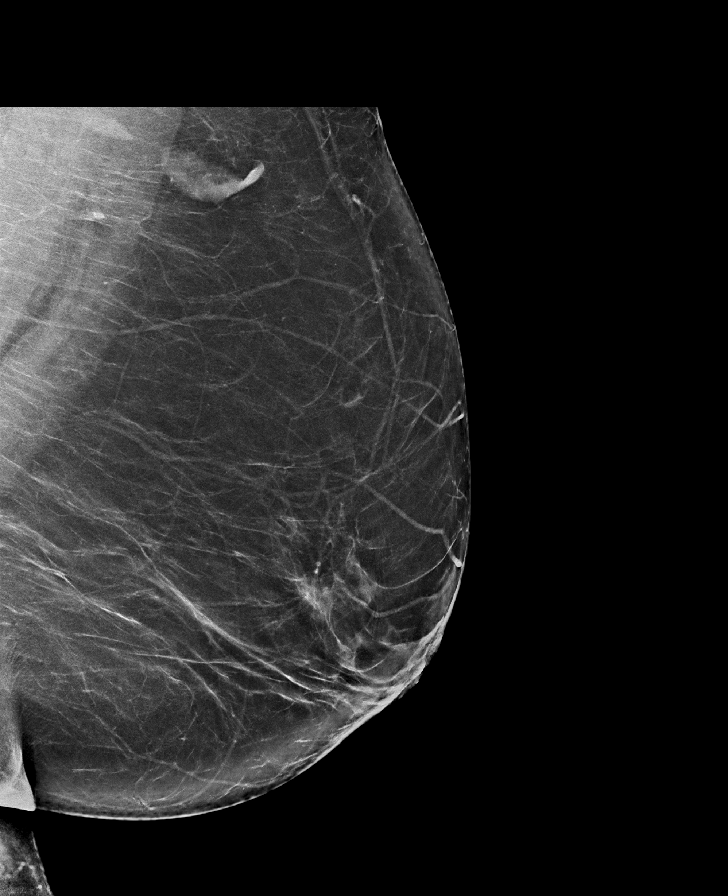

[R MLO synth-2D]
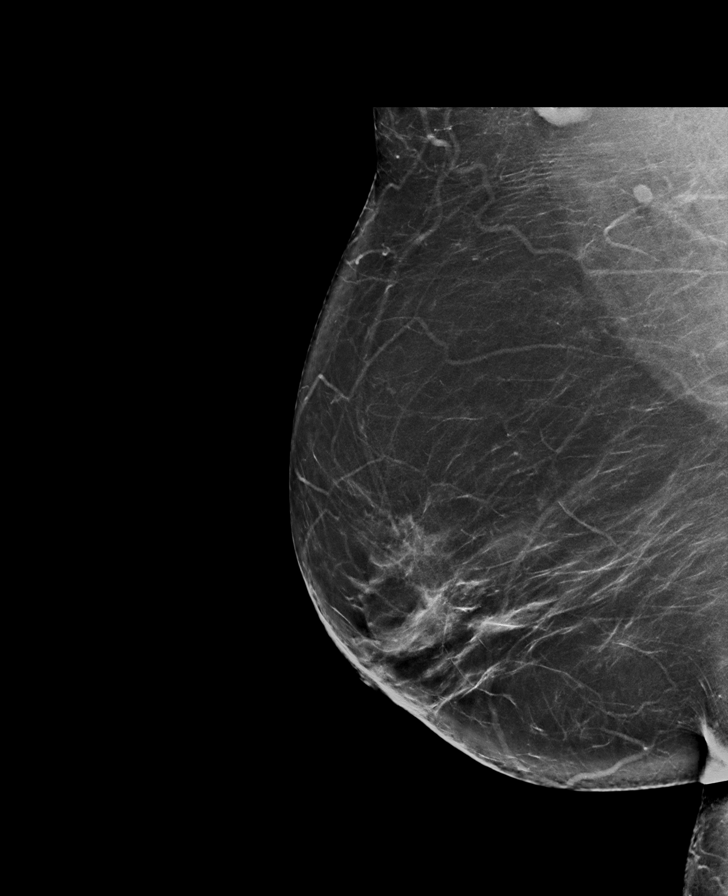

[R MLO tomo · tomo slice 45/88.0]
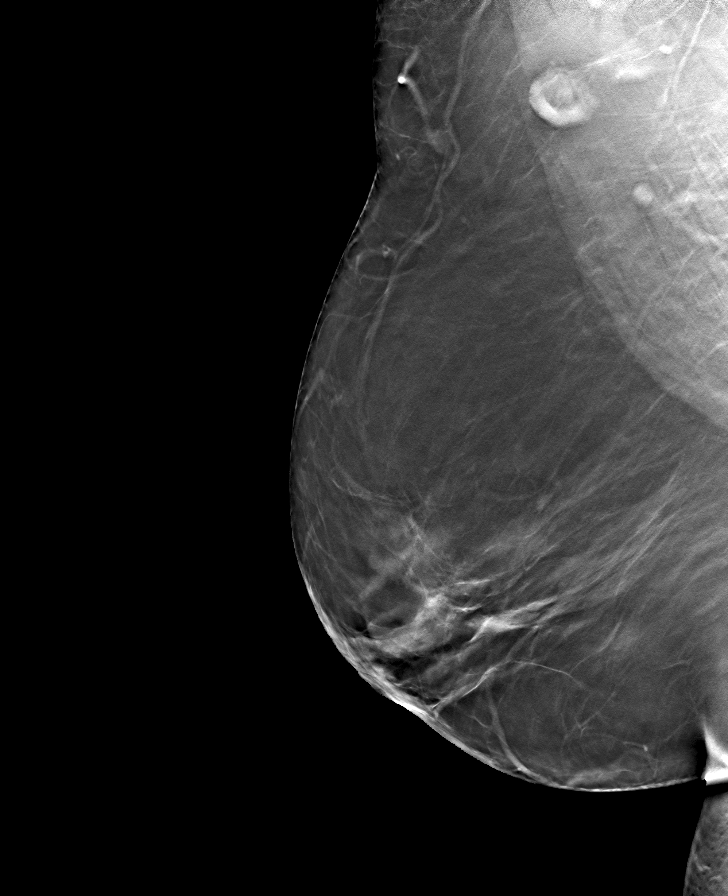

[R CC tomo · tomo slice 39/78.0]
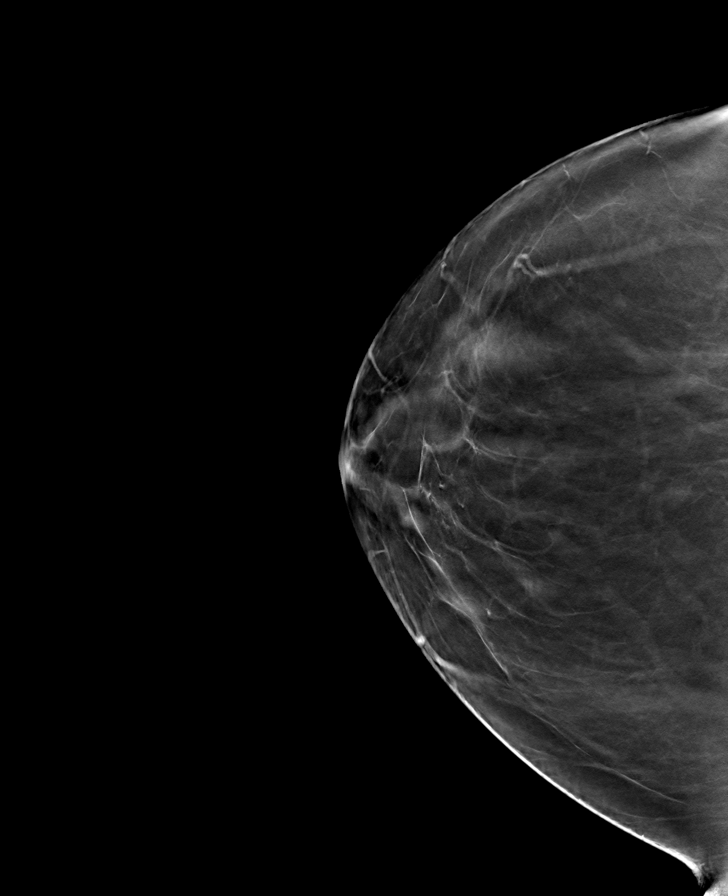

[L MLO tomo · tomo slice 45/89.0]
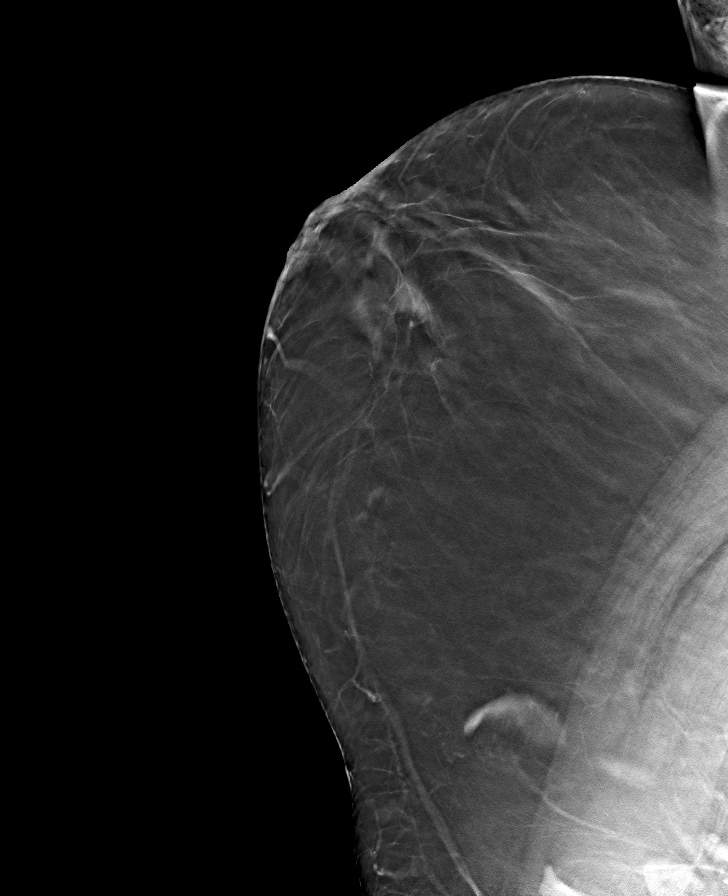

[L CC tomo · tomo slice 39/78.0]
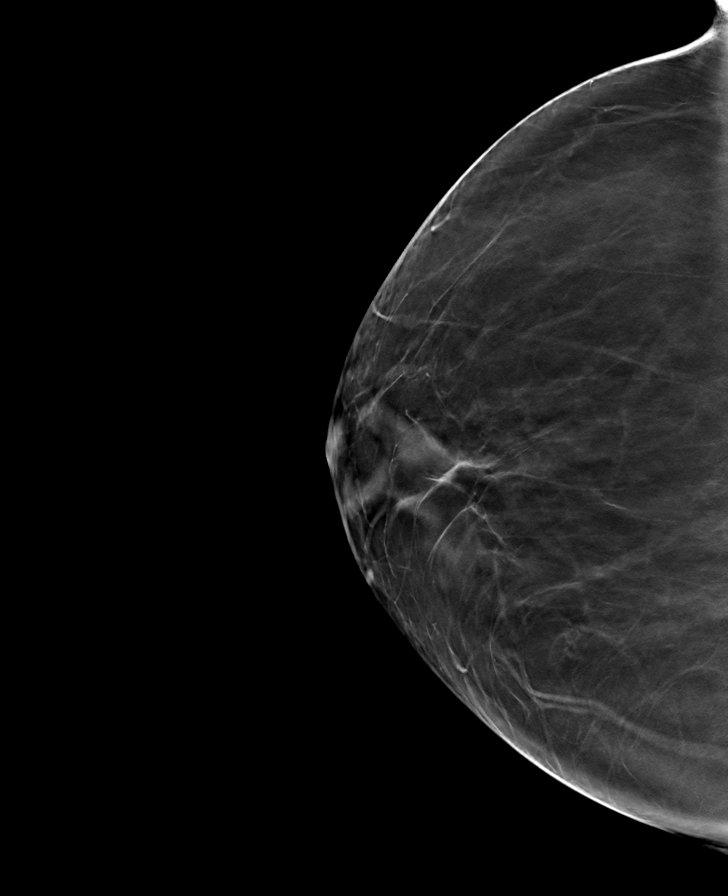

[8 of 24 positions shown; findings below may reference images not displayed]

ACR Breast Density Category b: There are scattered areas of
fibroglandular density.
FINDINGS: There are no findings suspicious for malignancy. Images were
processed with CAD.
IMPRESSION: No mammographic evidence of malignancy. A result letter of this
screening mammogram will be mailed directly to the patient.

RECOMMENDATION:
Screening mammogram in one year. (Code:[TQ])

BI-RADS CATEGORY  1: Negative.

## 2019-08-03 DIAGNOSIS — Z8619 Personal history of other infectious and parasitic diseases: Secondary | ICD-10-CM | POA: Diagnosis not present

## 2019-08-03 DIAGNOSIS — K529 Noninfective gastroenteritis and colitis, unspecified: Secondary | ICD-10-CM | POA: Diagnosis not present

## 2019-08-03 DIAGNOSIS — R197 Diarrhea, unspecified: Secondary | ICD-10-CM | POA: Diagnosis not present

## 2019-08-04 ENCOUNTER — Other Ambulatory Visit
Admission: RE | Admit: 2019-08-04 | Discharge: 2019-08-04 | Disposition: A | Discharge status: Home / Self Care | Payer: PPO | Source: Ambulatory Visit | Attending: Gastroenterology | Admitting: Gastroenterology

## 2019-08-04 DIAGNOSIS — R197 Diarrhea, unspecified: Secondary | ICD-10-CM | POA: Insufficient documentation

## 2019-08-04 DIAGNOSIS — Z8619 Personal history of other infectious and parasitic diseases: Secondary | ICD-10-CM | POA: Diagnosis not present

## 2019-08-04 LAB — GASTROINTESTINAL PANEL BY PCR, STOOL (REPLACES STOOL CULTURE)

## 2019-08-04 LAB — C DIFFICILE QUICK SCREEN W PCR REFLEX
C Diff antigen: NEGATIVE
C Diff interpretation: NOT DETECTED
C Diff toxin: NEGATIVE

## 2019-08-07 DIAGNOSIS — K529 Noninfective gastroenteritis and colitis, unspecified: Secondary | ICD-10-CM | POA: Diagnosis not present

## 2019-08-16 LAB — CALPROTECTIN, FECAL: Calprotectin, Fecal: 1322 ug/g — ABNORMAL HIGH (ref 0–120)

## 2019-09-13 DIAGNOSIS — K529 Noninfective gastroenteritis and colitis, unspecified: Secondary | ICD-10-CM | POA: Diagnosis not present

## 2019-10-17 DIAGNOSIS — D649 Anemia, unspecified: Secondary | ICD-10-CM | POA: Diagnosis not present

## 2019-10-17 DIAGNOSIS — E8809 Other disorders of plasma-protein metabolism, not elsewhere classified: Secondary | ICD-10-CM | POA: Diagnosis not present

## 2019-10-17 DIAGNOSIS — I10 Essential (primary) hypertension: Secondary | ICD-10-CM | POA: Diagnosis not present

## 2019-10-17 DIAGNOSIS — Z8619 Personal history of other infectious and parasitic diseases: Secondary | ICD-10-CM | POA: Diagnosis not present

## 2019-10-24 DIAGNOSIS — I10 Essential (primary) hypertension: Secondary | ICD-10-CM | POA: Diagnosis not present

## 2019-10-24 DIAGNOSIS — D649 Anemia, unspecified: Secondary | ICD-10-CM | POA: Diagnosis not present

## 2019-10-24 DIAGNOSIS — E7800 Pure hypercholesterolemia, unspecified: Secondary | ICD-10-CM | POA: Diagnosis not present

## 2019-10-24 DIAGNOSIS — K529 Noninfective gastroenteritis and colitis, unspecified: Secondary | ICD-10-CM | POA: Diagnosis not present

## 2019-10-24 DIAGNOSIS — Z8619 Personal history of other infectious and parasitic diseases: Secondary | ICD-10-CM | POA: Diagnosis not present

## 2020-02-14 DIAGNOSIS — Z8619 Personal history of other infectious and parasitic diseases: Secondary | ICD-10-CM | POA: Diagnosis not present

## 2020-02-14 DIAGNOSIS — E7800 Pure hypercholesterolemia, unspecified: Secondary | ICD-10-CM | POA: Diagnosis not present

## 2020-02-14 DIAGNOSIS — I10 Essential (primary) hypertension: Secondary | ICD-10-CM | POA: Diagnosis not present

## 2020-02-14 DIAGNOSIS — D649 Anemia, unspecified: Secondary | ICD-10-CM | POA: Diagnosis not present

## 2020-02-14 DIAGNOSIS — K529 Noninfective gastroenteritis and colitis, unspecified: Secondary | ICD-10-CM | POA: Diagnosis not present

## 2020-02-26 DIAGNOSIS — D72829 Elevated white blood cell count, unspecified: Secondary | ICD-10-CM | POA: Diagnosis not present

## 2020-02-26 DIAGNOSIS — K529 Noninfective gastroenteritis and colitis, unspecified: Secondary | ICD-10-CM | POA: Diagnosis not present

## 2020-02-26 DIAGNOSIS — N289 Disorder of kidney and ureter, unspecified: Secondary | ICD-10-CM | POA: Diagnosis not present

## 2020-02-26 DIAGNOSIS — R197 Diarrhea, unspecified: Secondary | ICD-10-CM | POA: Diagnosis not present

## 2020-02-26 DIAGNOSIS — D649 Anemia, unspecified: Secondary | ICD-10-CM | POA: Diagnosis not present

## 2020-03-31 DIAGNOSIS — E7800 Pure hypercholesterolemia, unspecified: Secondary | ICD-10-CM | POA: Diagnosis not present

## 2020-03-31 DIAGNOSIS — Z79899 Other long term (current) drug therapy: Secondary | ICD-10-CM | POA: Diagnosis not present

## 2020-03-31 DIAGNOSIS — D649 Anemia, unspecified: Secondary | ICD-10-CM | POA: Diagnosis not present

## 2020-03-31 DIAGNOSIS — I10 Essential (primary) hypertension: Secondary | ICD-10-CM | POA: Diagnosis not present

## 2020-04-02 ENCOUNTER — Other Ambulatory Visit: Payer: Self-pay | Admitting: Internal Medicine

## 2020-04-02 DIAGNOSIS — R197 Diarrhea, unspecified: Secondary | ICD-10-CM

## 2020-04-02 DIAGNOSIS — R748 Abnormal levels of other serum enzymes: Secondary | ICD-10-CM

## 2020-04-02 DIAGNOSIS — D649 Anemia, unspecified: Secondary | ICD-10-CM

## 2020-04-02 DIAGNOSIS — K529 Noninfective gastroenteritis and colitis, unspecified: Secondary | ICD-10-CM

## 2020-04-03 DIAGNOSIS — R748 Abnormal levels of other serum enzymes: Secondary | ICD-10-CM | POA: Diagnosis not present

## 2020-04-10 ENCOUNTER — Ambulatory Visit
Admission: RE | Admit: 2020-04-10 | Discharge: 2020-04-10 | Disposition: A | Discharge status: Home / Self Care | Payer: PPO | Source: Ambulatory Visit | Attending: Internal Medicine | Admitting: Internal Medicine

## 2020-04-10 ENCOUNTER — Other Ambulatory Visit: Payer: Self-pay

## 2020-04-10 DIAGNOSIS — K529 Noninfective gastroenteritis and colitis, unspecified: Secondary | ICD-10-CM | POA: Diagnosis not present

## 2020-04-10 DIAGNOSIS — R197 Diarrhea, unspecified: Secondary | ICD-10-CM | POA: Insufficient documentation

## 2020-04-10 DIAGNOSIS — R748 Abnormal levels of other serum enzymes: Secondary | ICD-10-CM | POA: Diagnosis not present

## 2020-04-10 DIAGNOSIS — K5730 Diverticulosis of large intestine without perforation or abscess without bleeding: Secondary | ICD-10-CM | POA: Diagnosis not present

## 2020-04-10 DIAGNOSIS — D649 Anemia, unspecified: Secondary | ICD-10-CM | POA: Diagnosis not present

## 2020-04-10 IMAGING — CT CT ABD-PELV W/ CM
2 of 5 series · 15 of 46 positions shown, 17 images · IV contrast (APPLIED)
Comparison: [DATE]

CLINICAL DATA: Elevated alkaline phosphatase, chronic diarrhea

EXAM:
CT ABDOMEN AND PELVIS WITH CONTRAST
TECHNIQUE: Multidetector CT imaging of the abdomen and pelvis was performed
using the standard protocol following bolus administration of
intravenous contrast.
CONTRAST:  100mL OMNIPAQUE IOHEXOL 300 MG/ML SOLN, additional oral
enteric contrast

[Series 2: routine abd/pel with · axial · 0.73mm/px · z∈[-721,-306]mm · 12 of 95 slices shown, 14 images]
[im 6/95  soft-tissue]
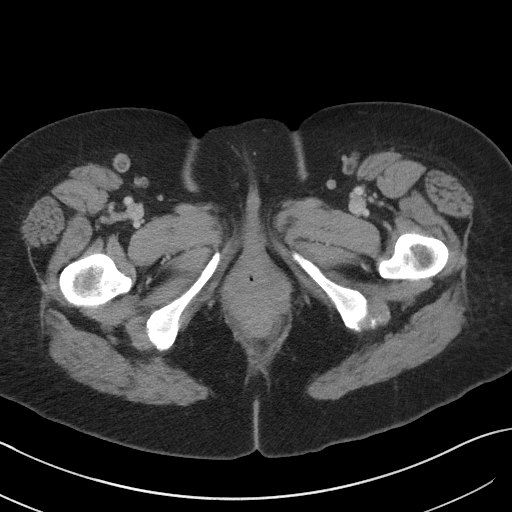
[im 6/95  bone]
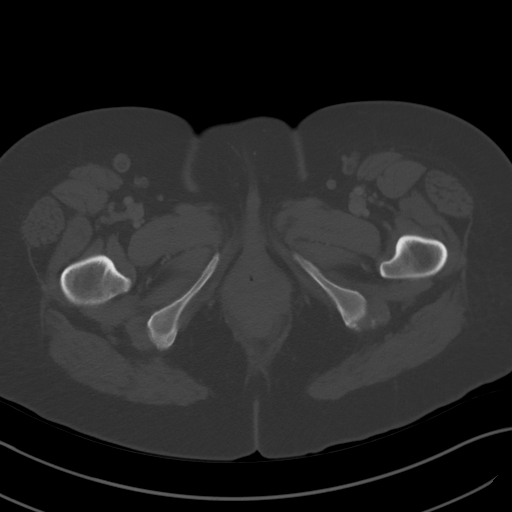
[im 17/95  soft-tissue]
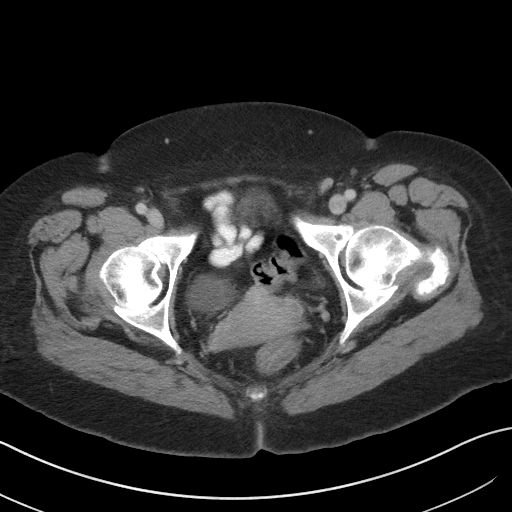
[im 23/95  soft-tissue]
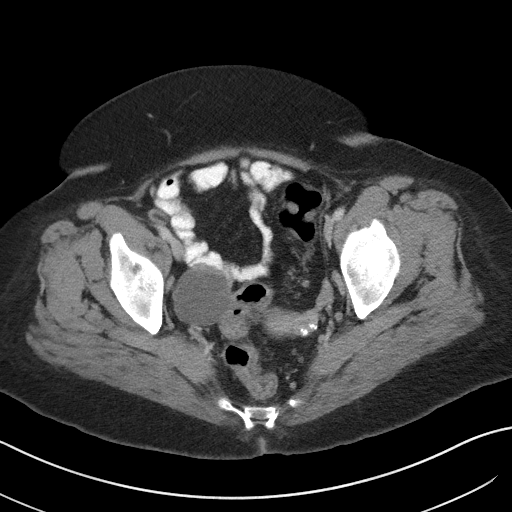
[im 28/95  soft-tissue]
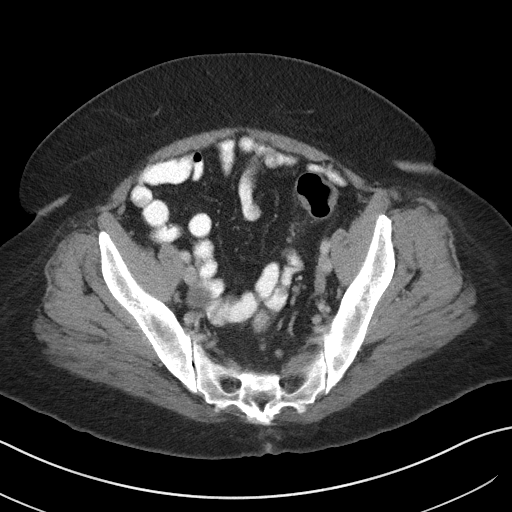
[im 39/95  soft-tissue]
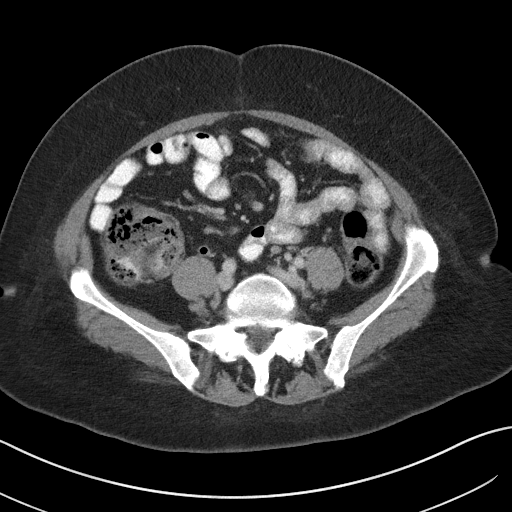
[im 45/95  soft-tissue]
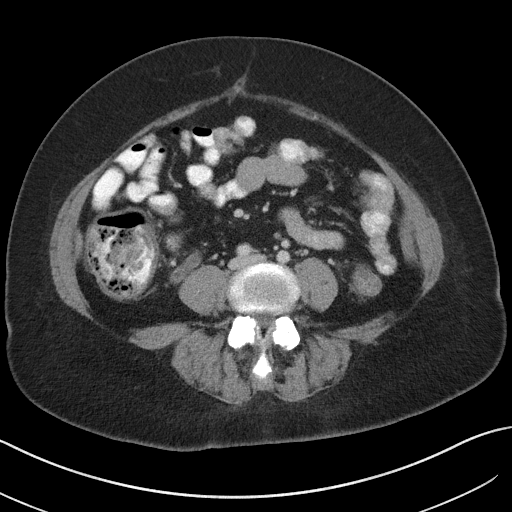
[im 50/95  soft-tissue]
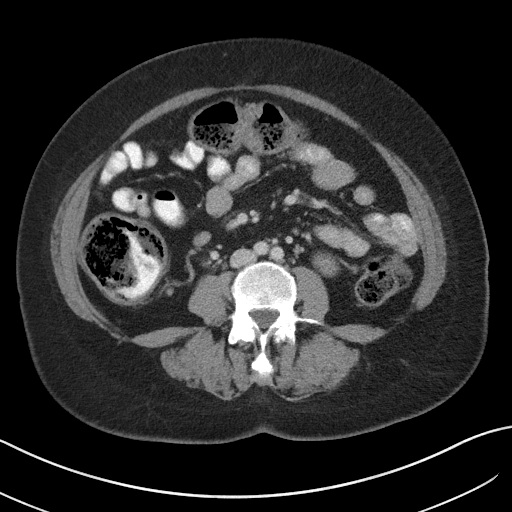
[im 61/95  soft-tissue]
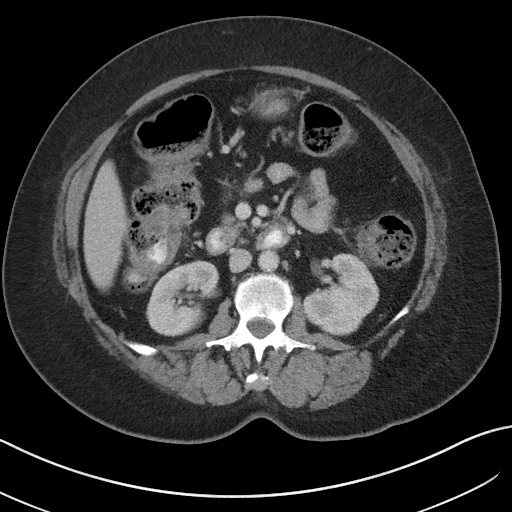
[im 67/95  soft-tissue]
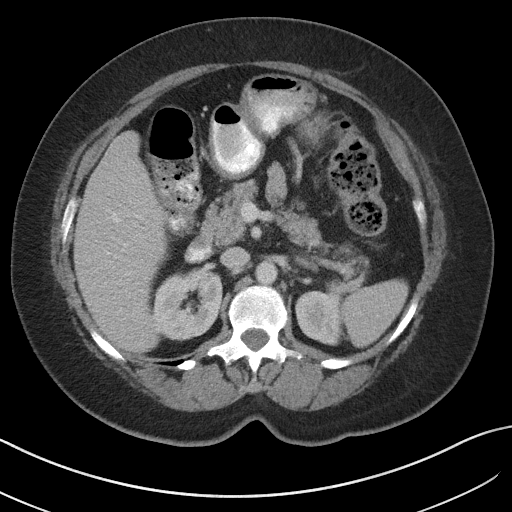
[im 67/95  bone]
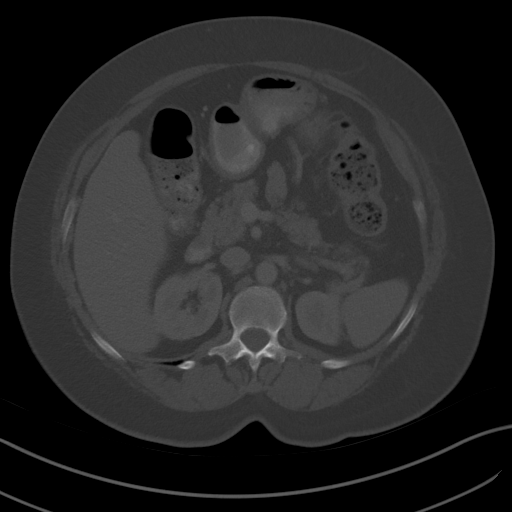
[im 72/95  soft-tissue]
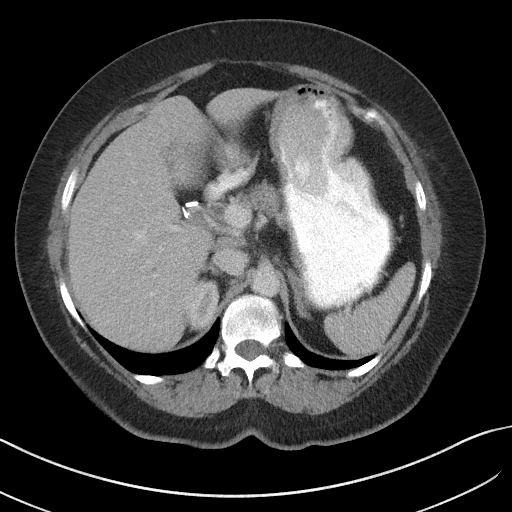
[im 83/95  soft-tissue]
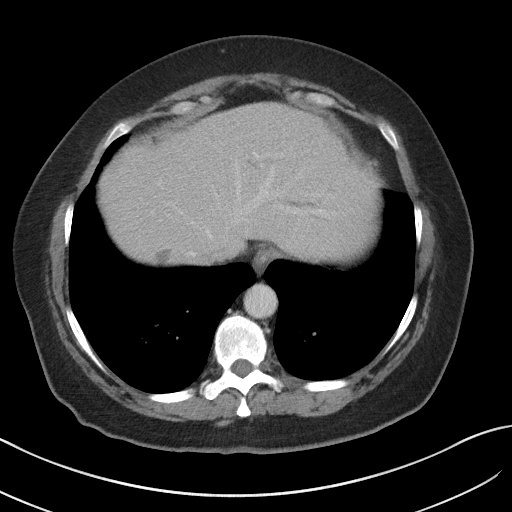
[im 89/95  soft-tissue]
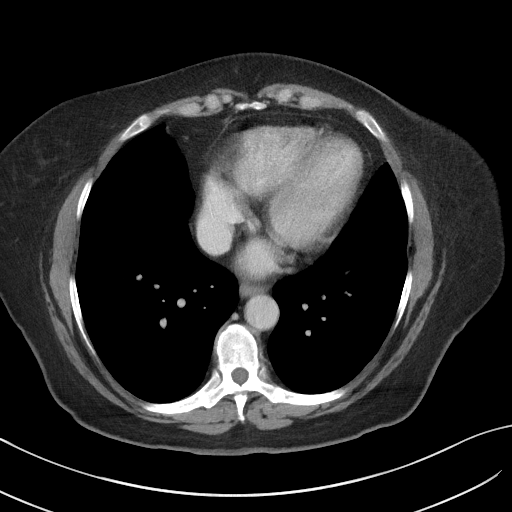

[Series 5: coronal st · coronal · 0.65mm/px · 3 of 113 slices shown]
[im 38/113  soft-tissue]
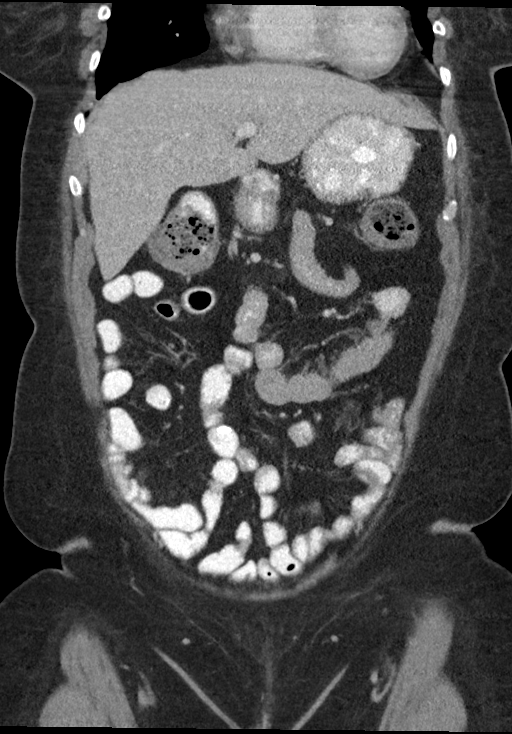
[im 50/113  soft-tissue]
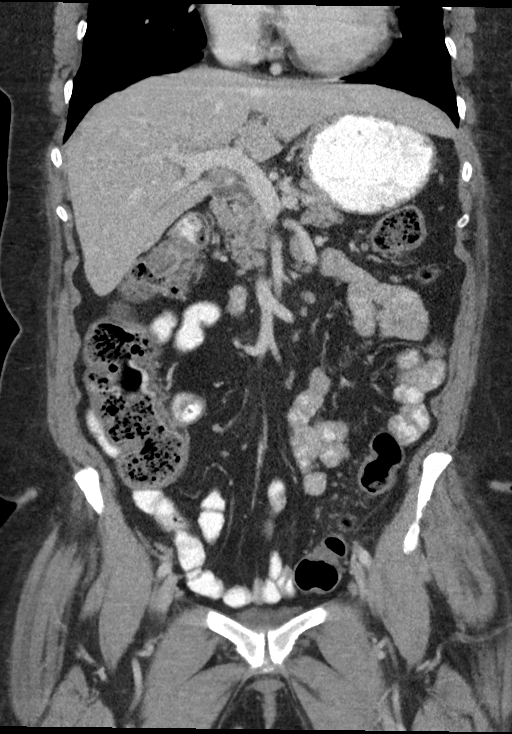
[im 63/113  soft-tissue]
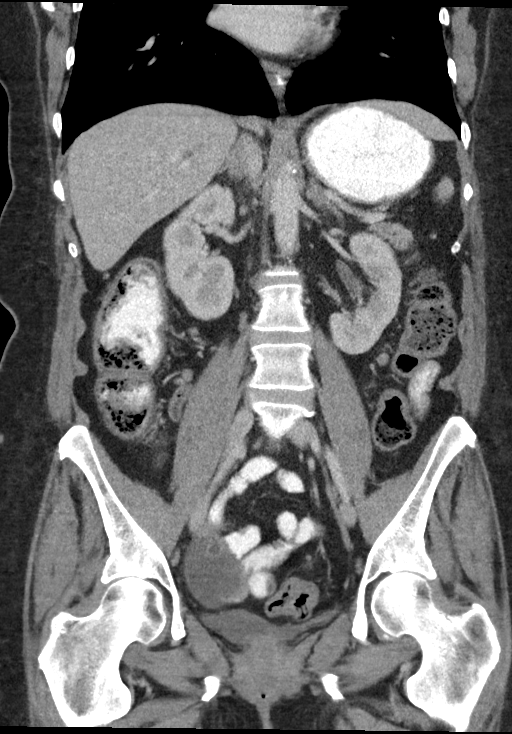

[15 of 46 positions shown; findings below may reference images not displayed]

FINDINGS: Lower chest: No acute abnormality.

Hepatobiliary: No focal liver abnormality is seen. Status post
cholecystectomy. Mild postoperative biliary dilatation.

Pancreas: Unremarkable. No pancreatic ductal dilatation or
surrounding inflammatory changes.

Spleen: Normal in size without significant abnormality.

Adrenals/Urinary Tract: Adrenal glands are unremarkable. Kidneys are
normal, without renal calculi, solid lesion, or hydronephrosis.
Bladder is unremarkable.

Stomach/Bowel: Stomach is within normal limits. Appendix appears
normal. Sigmoid diverticulosis. Mild thickening of the sigmoid colon
and rectum with some adjacent vascular combing and prominent medial
colonic and perirectal lymph nodes.

Vascular/Lymphatic: Aortic atherosclerosis. No enlarged abdominal or
pelvic lymph nodes.

Reproductive: Significant interval enlargement of a right ovarian
cyst, with some suggestion of mural nodularity or thin septation,
measuring 4.9 cm, previously 2.2 cm (series 2, image 75)

Other: No abdominal wall hernia or abnormality. No abdominopelvic
ascites.

Musculoskeletal: No acute or significant osseous findings.
IMPRESSION: 1. Sigmoid diverticulosis. Mild thickening of the sigmoid colon and
rectum with some adjacent vascular combing and prominent medial
colonic and perirectal lymph nodes. Findings are suggestive of
nonspecific infectious or inflammatory proctocolitis given report of
chronic diarrhea.

2. Significant interval enlargement of a right ovarian cyst, with
some suggestion of mural nodularity or thin septation, measuring
cm, previously 2.2 cm, abnormal and concerning for malignancy in the
postmenopausal setting. Recommend initial pelvic ultrasound to
further characterize for solid component.

3.  Aortic Atherosclerosis ([S5]-[S5]).

These results will be called to the ordering clinician or
representative by the Radiologist Assistant, and communication
documented in the PACS or [REDACTED].

## 2020-04-10 MED ORDER — IOHEXOL 300 MG/ML  SOLN
100.0000 mL | Freq: Once | INTRAMUSCULAR | Status: AC | PRN
  Administered 2020-04-10: 100 mL via INTRAVENOUS

## 2020-04-11 ENCOUNTER — Other Ambulatory Visit: Payer: Self-pay | Admitting: Internal Medicine

## 2020-04-11 DIAGNOSIS — N83201 Unspecified ovarian cyst, right side: Secondary | ICD-10-CM

## 2020-04-11 DIAGNOSIS — N83202 Unspecified ovarian cyst, left side: Secondary | ICD-10-CM

## 2020-04-18 ENCOUNTER — Other Ambulatory Visit: Payer: Self-pay

## 2020-04-18 ENCOUNTER — Inpatient Hospital Stay: Payer: PPO | Attending: Internal Medicine

## 2020-04-18 ENCOUNTER — Inpatient Hospital Stay (HOSPITAL_BASED_OUTPATIENT_CLINIC_OR_DEPARTMENT_OTHER): Payer: PPO | Admitting: Internal Medicine

## 2020-04-18 VITALS — BP 140/68 | HR 83 | Temp 97.5°F | Wt 214.0 lb

## 2020-04-18 DIAGNOSIS — N83201 Unspecified ovarian cyst, right side: Secondary | ICD-10-CM | POA: Diagnosis not present

## 2020-04-18 DIAGNOSIS — N83202 Unspecified ovarian cyst, left side: Secondary | ICD-10-CM | POA: Diagnosis not present

## 2020-04-18 DIAGNOSIS — D649 Anemia, unspecified: Secondary | ICD-10-CM

## 2020-04-18 DIAGNOSIS — R197 Diarrhea, unspecified: Secondary | ICD-10-CM | POA: Insufficient documentation

## 2020-04-18 DIAGNOSIS — D890 Polyclonal hypergammaglobulinemia: Secondary | ICD-10-CM | POA: Insufficient documentation

## 2020-04-18 DIAGNOSIS — Z79899 Other long term (current) drug therapy: Secondary | ICD-10-CM | POA: Diagnosis not present

## 2020-04-18 DIAGNOSIS — R748 Abnormal levels of other serum enzymes: Secondary | ICD-10-CM | POA: Insufficient documentation

## 2020-04-18 DIAGNOSIS — D3911 Neoplasm of uncertain behavior of right ovary: Secondary | ICD-10-CM | POA: Diagnosis not present

## 2020-04-18 DIAGNOSIS — Z7982 Long term (current) use of aspirin: Secondary | ICD-10-CM | POA: Diagnosis not present

## 2020-04-18 DIAGNOSIS — I10 Essential (primary) hypertension: Secondary | ICD-10-CM | POA: Diagnosis not present

## 2020-04-18 DIAGNOSIS — R5383 Other fatigue: Secondary | ICD-10-CM | POA: Insufficient documentation

## 2020-04-18 LAB — CBC WITH DIFFERENTIAL/PLATELET
Abs Immature Granulocytes: 0.05 10*3/uL (ref 0.00–0.07)
Basophils Absolute: 0 10*3/uL (ref 0.0–0.1)
Basophils Relative: 0 %
Eosinophils Absolute: 0.2 10*3/uL (ref 0.0–0.5)
Eosinophils Relative: 2 %
HCT: 30 % — ABNORMAL LOW (ref 36.0–46.0)
Hemoglobin: 9.7 g/dL — ABNORMAL LOW (ref 12.0–15.0)
Immature Granulocytes: 0 %
Lymphocytes Relative: 22 %
Lymphs Abs: 2.5 10*3/uL (ref 0.7–4.0)
MCH: 29.8 pg (ref 26.0–34.0)
MCHC: 32.3 g/dL (ref 30.0–36.0)
MCV: 92.3 fL (ref 80.0–100.0)
Monocytes Absolute: 1.1 10*3/uL — ABNORMAL HIGH (ref 0.1–1.0)
Monocytes Relative: 10 %
Neutro Abs: 7.6 10*3/uL (ref 1.7–7.7)
Neutrophils Relative %: 66 %
Platelets: 435 10*3/uL — ABNORMAL HIGH (ref 150–400)
RBC: 3.25 MIL/uL — ABNORMAL LOW (ref 3.87–5.11)
RDW: 15.1 % (ref 11.5–15.5)
WBC: 11.5 10*3/uL — ABNORMAL HIGH (ref 4.0–10.5)
nRBC: 0 % (ref 0.0–0.2)

## 2020-04-18 LAB — BASIC METABOLIC PANEL
Anion gap: 9 (ref 5–15)
BUN: 19 mg/dL (ref 8–23)
CO2: 22 mmol/L (ref 22–32)
Calcium: 8.7 mg/dL — ABNORMAL LOW (ref 8.9–10.3)
Chloride: 104 mmol/L (ref 98–111)
Creatinine, Ser: 1 mg/dL (ref 0.44–1.00)
GFR calc Af Amer: >60 mL/min (ref >60)
GFR calc non Af Amer: 58 mL/min — ABNORMAL LOW (ref >60)
Glucose, Bld: 109 mg/dL — ABNORMAL HIGH (ref 70–99)
Potassium: 3.7 mmol/L (ref 3.5–5.1)
Sodium: 135 mmol/L (ref 135–145)

## 2020-04-18 NOTE — Progress Notes (Signed)
[facility] NOTE  Patient Care Team: Tracie Harrier, MD as PCP - General (Internal Medicine)  CHIEF COMPLAINTS/PURPOSE OF CONSULTATION:  ANEMIA/polyclonal gammopathy  #2018-Anemia hemoglobin 9-10/normocytic-second to chronic inflammation//C. 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 C.diff s/p Vanco PO  [Dr.Elliot; [place]  Oncology History   No history exists.     HISTORY OF PRESENTING ILLNESS:  Christy Summers 69 y.o.  female patient with history of chronic anemia unclear etiology; and also chronic diarrhea is here for follow-up.  Patient interim had a CT scan with her PCP noted to have an abnormal approximately 4-5 cm right adnexal mass concerning for malignancy.  Patient is awaiting pelvic ultrasound next week.  Patient continues to have ongoing diarrhea.  However recent work-up for CVA was negative.   Mild to moderate fatigue.  Denies any pain.  Review of Systems  Constitutional: Positive for malaise/fatigue. Negative for chills, diaphoresis, fever and weight loss.  HENT: Negative for nosebleeds and sore throat.   Eyes: Negative for double vision.  Respiratory: Negative for cough, hemoptysis, sputum production, shortness of breath and wheezing.   Cardiovascular: Negative for chest pain, palpitations, orthopnea and leg swelling.  Gastrointestinal: Positive for diarrhea. Negative for abdominal pain, blood in stool, constipation, heartburn, melena, nausea and vomiting.  Genitourinary: Negative for dysuria, frequency and urgency.  Musculoskeletal: Negative for back pain and joint pain.  Skin: Negative.  Negative for itching and rash.  Neurological: Negative for dizziness, tingling, focal weakness, weakness and headaches.  Endo/Heme/Allergies: Does not bruise/bleed easily.  Psychiatric/Behavioral: Negative for depression. The patient is not nervous/anxious and does not have insomnia.      MEDICAL HISTORY:   Past Medical History:  Diagnosis Date  . Elevated lipids   . Hypertension     SURGICAL HISTORY: Past Surgical History:  Procedure Laterality Date  . CHOLECYSTECTOMY    . COLONOSCOPY WITH PROPOFOL N/A 09/07/2017   Procedure: COLONOSCOPY WITH PROPOFOL;  Surgeon: Toledo, Benay Pike, MD;  Location: [facility] ENDOSCOPY;  Service: Endoscopy;  Laterality: N/A;  . COLONOSCOPY WITH PROPOFOL N/A 01/22/2019   Procedure: COLONOSCOPY WITH PROPOFOL;  Surgeon: Manya Silvas, MD;  Location: [facility] ENDOSCOPY;  Service: Endoscopy;  Laterality: N/A;  . ESOPHAGOGASTRODUODENOSCOPY (EGD) WITH PROPOFOL N/A 09/07/2017   Procedure: ESOPHAGOGASTRODUODENOSCOPY (EGD) WITH PROPOFOL;  Surgeon: Toledo, Benay Pike, MD;  Location: [facility] ENDOSCOPY;  Service: Endoscopy;  Laterality: N/A;    SOCIAL HISTORY: Social History   Socioeconomic History  . Marital status: Divorced    Spouse name: Not on file  . Number of children: Not on file  . Years of education: Not on file  . Highest education level: Not on file  Occupational History  . Not on file  Tobacco Use  . Smoking status: Never Smoker  . Smokeless tobacco: Never Used  Substance and Sexual Activity  . Alcohol use: Not Currently  . Drug use: Not Currently  . Sexual activity: Not on file  Other Topics Concern  . Not on file  Social History Narrative   She is retired from [employer] with family in [place].  No smoking no alcohol.   Social Determinants of Health   Financial Resource Strain:   . Difficulty of Paying Living Expenses:   Food Insecurity:   . Worried About Charity fundraiser in the Last Year:   . Arboriculturist in the Last Year:   Transportation Needs:   . Film/video editor (Medical):   Marland Kitchen  Lack of Transportation (Non-Medical):   Physical Activity:   . Days of Exercise per Week:   . Minutes of Exercise per Session:   Stress:   . Feeling of Stress :   Social Connections:   . Frequency of Communication with Friends and Family:   .  Frequency of Social Gatherings with Friends and Family:   . Attends Religious Services:   . Active Member of Clubs or Organizations:   . Attends Archivist Meetings:   Marland Kitchen Marital Status:   Intimate Partner Violence:   . Fear of Current or Ex-Partner:   . Emotionally Abused:   Marland Kitchen Physically Abused:   . Sexually Abused:     FAMILY HISTORY: Family History  Problem Relation Age of Onset  . Breast cancer Neg Hx     ALLERGIES:  is allergic to mesalamine.  MEDICATIONS:  Current Outpatient Medications  Medication Sig Dispense Refill  . amLODipine (NORVASC) 10 MG tablet Take 10 mg by mouth daily.    . Ascorbic Acid (VITAMIN C) 500 MG CAPS Take by mouth.    Marland Kitchen aspirin 81 MG chewable tablet Chew 81 mg by mouth daily.    . calcium carbonate (TUMS) 500 MG chewable tablet Chew by mouth.    . Cholecalciferol (VITAMIN D-1000 MAX ST) 25 MCG (1000 UT) tablet Take by mouth.    . cyanocobalamin 1000 MCG tablet Take 1,000 mcg by mouth daily.    Marland Kitchen losartan-hydrochlorothiazide (HYZAAR) 100-25 MG tablet Take 1 tablet by mouth daily.    . potassium chloride (K-DUR) 10 MEQ tablet Take 10 mEq by mouth daily.    . iron polysaccharides (NIFEREX) 150 MG capsule Take 150 mg by mouth daily.      No current facility-administered medications for this visit.      Marland Kitchen  PHYSICAL EXAMINATION: ECOG PERFORMANCE STATUS: 1 - Symptomatic but completely ambulatory  Vitals:   04/18/20 0942  BP: 140/68  Pulse: 83  Temp: (!) 97.5 F (36.4 C)   Filed Weights   04/18/20 0942  Weight: 214 lb (97.1 kg)    Physical Exam  Constitutional: She is oriented to person, place, and time and well-developed, well-nourished, and in no distress.  HENT:  Head: Normocephalic and atraumatic.  Mouth/Throat: Oropharynx is clear and moist. No oropharyngeal exudate.  Eyes: Pupils are equal, round, and reactive to light.  Cardiovascular: Normal rate and regular rhythm.  Pulmonary/Chest: No respiratory distress. She has no  wheezes.  Abdominal: Soft. Bowel sounds are normal. She exhibits no distension and no mass. There is no abdominal tenderness. There is no rebound and no guarding.  Musculoskeletal:        General: No tenderness or edema. Normal range of motion.     Cervical back: Normal range of motion and neck supple.  Neurological: She is alert and oriented to person, place, and time.  Skin: Skin is warm.  Psychiatric: Affect normal.     LABORATORY DATA:  I have reviewed the data as listed Lab Results  Component Value Date   WBC 11.5 (H) 04/18/2020   HGB 9.7 (L) 04/18/2020   HCT 30.0 (L) 04/18/2020   MCV 92.3 04/18/2020   PLT 435 (H) 04/18/2020   Recent Labs    04/18/20 0909  NA 135  K 3.7  CL 104  CO2 22  GLUCOSE 109*  BUN 19  CREATININE 1.00  CALCIUM 8.7*  GFRNONAA 58*  GFRAA >60    RADIOGRAPHIC STUDIES: I have personally reviewed the radiological images as listed  and agreed with the findings in the report. CT ABDOMEN PELVIS W CONTRAST  Result Date: 04/10/2020 CLINICAL DATA:  Elevated alkaline phosphatase, chronic diarrhea EXAM: CT ABDOMEN AND PELVIS WITH CONTRAST TECHNIQUE: Multidetector CT imaging of the abdomen and pelvis was performed using the standard protocol following bolus administration of intravenous contrast. CONTRAST:  139mL OMNIPAQUE IOHEXOL 300 MG/ML SOLN, additional oral enteric contrast COMPARISON:  03/09/2018 FINDINGS: Lower chest: No acute abnormality. Hepatobiliary: No focal liver abnormality is seen. Status post cholecystectomy. Mild postoperative biliary dilatation. Pancreas: Unremarkable. No pancreatic ductal dilatation or surrounding inflammatory changes. Spleen: Normal in size without significant abnormality. Adrenals/Urinary Tract: Adrenal glands are unremarkable. Kidneys are normal, without renal calculi, solid lesion, or hydronephrosis. Bladder is unremarkable. Stomach/Bowel: Stomach is within normal limits. Appendix appears normal. Sigmoid diverticulosis. Mild  thickening of the sigmoid colon and rectum with some adjacent vascular combing and prominent medial colonic and perirectal lymph nodes. Vascular/Lymphatic: Aortic atherosclerosis. No enlarged abdominal or pelvic lymph nodes. Reproductive: Significant interval enlargement of a right ovarian cyst, with some suggestion of mural nodularity or thin septation, measuring 4.9 cm, previously 2.2 cm (series 2, image 75) Other: No abdominal wall hernia or abnormality. No abdominopelvic ascites. Musculoskeletal: No acute or significant osseous findings. IMPRESSION: 1. Sigmoid diverticulosis. Mild thickening of the sigmoid colon and rectum with some adjacent vascular combing and prominent medial colonic and perirectal lymph nodes. Findings are suggestive of nonspecific infectious or inflammatory proctocolitis given report of chronic diarrhea. 2. Significant interval enlargement of a right ovarian cyst, with some suggestion of mural nodularity or thin septation, measuring 4.9 cm, previously 2.2 cm, abnormal and concerning for malignancy in the postmenopausal setting. Recommend initial pelvic ultrasound to further characterize for solid component. 3.  Aortic Atherosclerosis (ICD10-I70.0). These results will be called to the ordering clinician or representative by the Radiologist Assistant, and communication documented in the PACS or [employer]. Electronically Signed   By: Eddie Candle M.D.   On: 04/10/2020 14:51    ASSESSMENT & PLAN:   Normocytic anemia # Anemia-normocytic; between 9-10 since 2019.  Today hemoglobin is 9.7.  Stable.  Monitor for now; see discussion below  # Right ovary cyst-noted on CT scan [PCP] reviewed the imaging.  Discussed differential diagnosis-benign versus malignant.  Check Ca-1 2 5.  Await pelvic ultrasound as ordered by PCP.  Discussed the possible evaluation with [facility] oncology if concerning.   #Chronic diarrhea-[3 years] s/p colo [Dr.Elliot]-April 2021 CT scan sigmoid  colitis.  However, recent C. difficile negative.  Defer to GI for further evaluation of diarrhea.  # DISPOSITION: pt will call [facility] next week # TODAY labs- Ca-125 # follow up in 3 months; MD;labs- cbc/cmp;CRP--Dr.B  # I reviewed the blood work- with the patient in detail; also reviewed the imaging independently [as summarized above]; and with the patient in detail.    Cc; Hande   All questions were answered. The patient knows to call the clinic with any problems, questions or concerns.    Cammie Sickle, MD 04/20/2020 2:48 PM

## 2020-04-18 NOTE — Assessment & Plan Note (Addendum)
#   Anemia-normocytic; between 9-10 since 2019.  Today hemoglobin is 9.7.  Stable.  Monitor for now; see discussion below  # Right ovary cyst-noted on CT scan [PCP] reviewed the imaging.  Discussed differential diagnosis-benign versus malignant.  Check Ca-1 2 5.  Await pelvic ultrasound as ordered by PCP.  Discussed the possible evaluation with [facility] oncology if concerning.   #Chronic diarrhea-[3 years] s/p colo [Dr.Elliot]-April 2021 CT scan sigmoid colitis.  However, recent C. difficile negative.  Defer to GI for further evaluation of diarrhea.  # DISPOSITION: pt will call [facility] next week # TODAY labs- Ca-125 # follow up in 3 months; MD;labs- cbc/cmp;CRP--Dr.B  # I reviewed the blood work- with the patient in detail; also reviewed the imaging independently [as summarized above]; and with the patient in detail.    Cc; Hande

## 2020-04-19 DIAGNOSIS — Z8619 Personal history of other infectious and parasitic diseases: Secondary | ICD-10-CM

## 2020-04-19 HISTORY — DX: Personal history of other infectious and parasitic diseases: Z86.19

## 2020-04-19 LAB — CA 125: Cancer Antigen (CA) 125: 4.1 U/mL (ref 0.0–38.1)

## 2020-04-21 ENCOUNTER — Other Ambulatory Visit: Payer: Self-pay

## 2020-04-21 ENCOUNTER — Ambulatory Visit
Admission: RE | Admit: 2020-04-21 | Discharge: 2020-04-21 | Disposition: A | Discharge status: Home / Self Care | Payer: PPO | Source: Ambulatory Visit | Attending: Internal Medicine | Admitting: Internal Medicine

## 2020-04-21 DIAGNOSIS — D259 Leiomyoma of uterus, unspecified: Secondary | ICD-10-CM | POA: Diagnosis not present

## 2020-04-21 DIAGNOSIS — N83291 Other ovarian cyst, right side: Secondary | ICD-10-CM | POA: Diagnosis not present

## 2020-04-21 DIAGNOSIS — N83202 Unspecified ovarian cyst, left side: Secondary | ICD-10-CM | POA: Diagnosis not present

## 2020-04-21 DIAGNOSIS — N83201 Unspecified ovarian cyst, right side: Secondary | ICD-10-CM | POA: Diagnosis not present

## 2020-04-21 IMAGING — US US PELVIS COMPLETE WITH TRANSVAGINAL
2 series · 13 of 25 positions shown · non-contrast
Comparison: CT abdomen and pelvis [DATE]

CLINICAL DATA: Abnormal CT exam, RIGHT ovarian cyst; presumed
postmenopausal by age

EXAM:
TRANSABDOMINAL AND TRANSVAGINAL ULTRASOUND OF PELVIS
TECHNIQUE: Both transabdominal and transvaginal ultrasound examinations of the
pelvis were performed. Transabdominal technique was performed for
global imaging of the pelvis including uterus, ovaries, adnexal
regions, and pelvic cul-de-sac. It was necessary to proceed with
endovaginal exam following the transabdominal exam to visualize the
uterus, endometrium, and ovaries, and to characterize a cystic
lesion of the RIGHT adnexa.

[Series 1: us pelvis complete with transvaginal · 0.24mm/px · 95 acquisitions, 12 frames shown (1 of 2)]
[im 1/95]
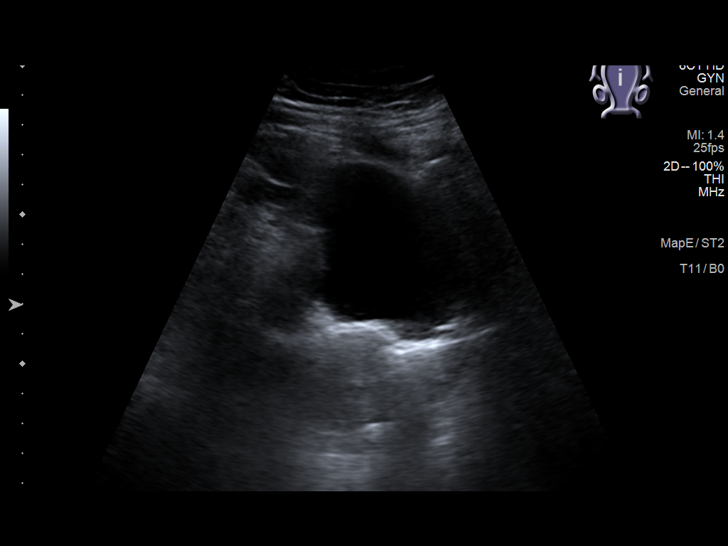
[im 9/95]
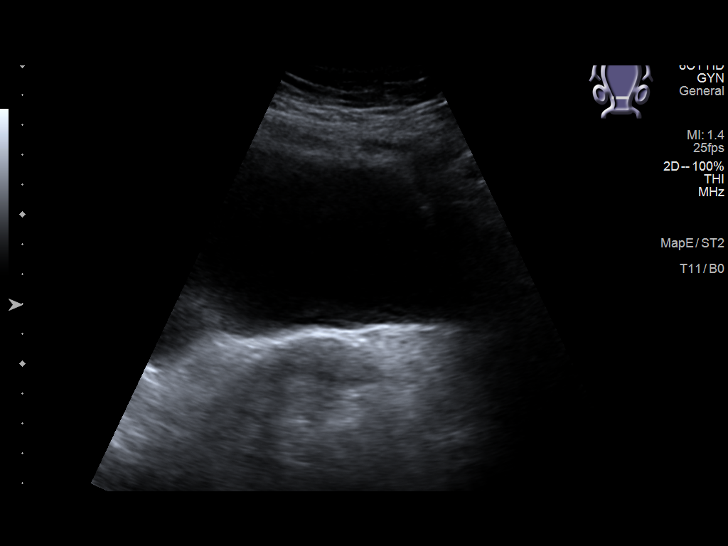
[im 17/95]
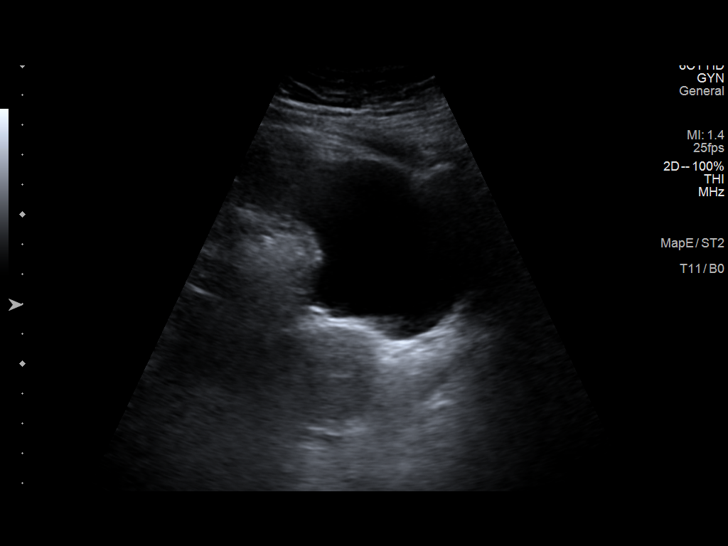
[im 25/95]
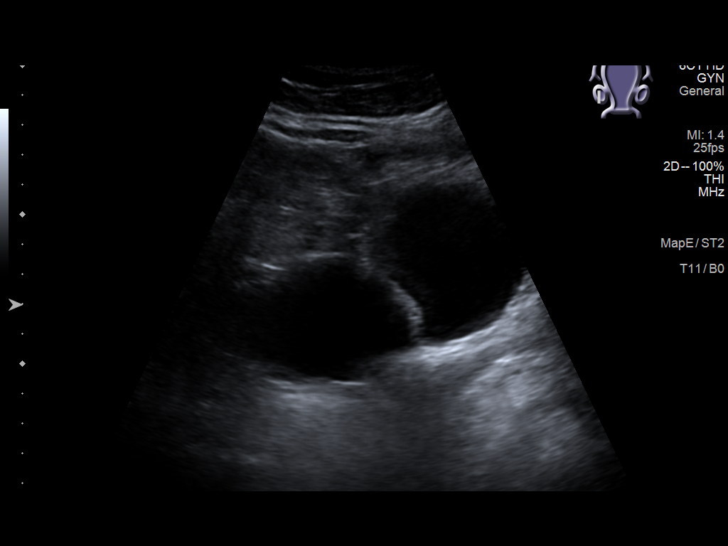
[im 33/95]
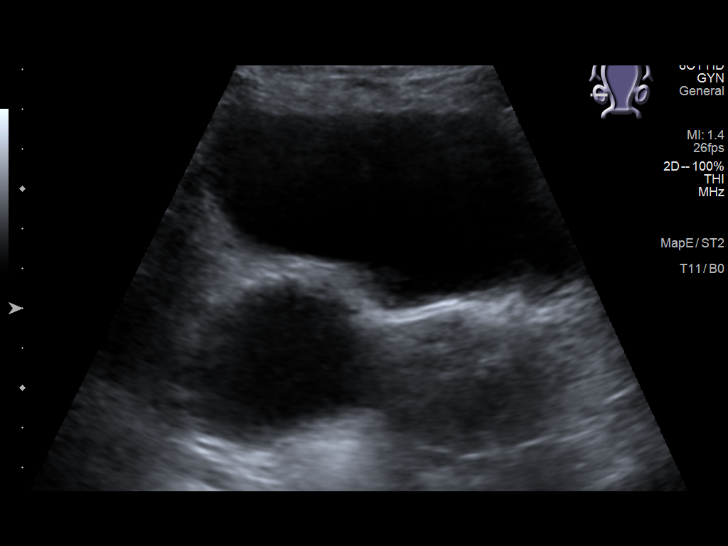
[im 41/95]
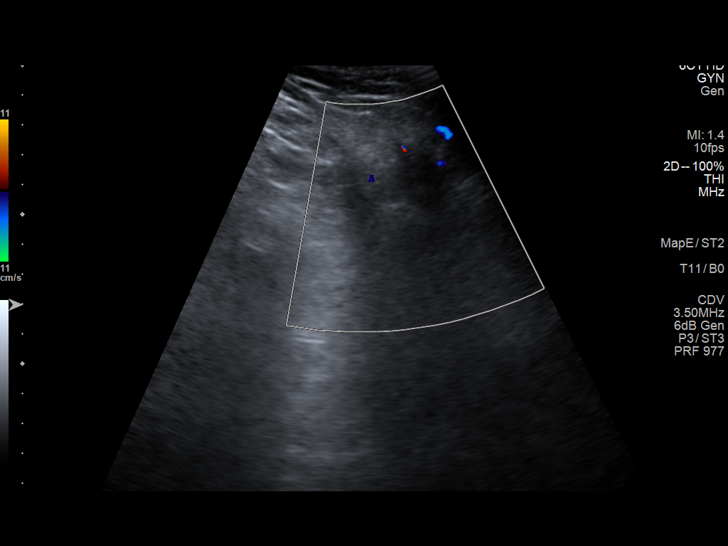
[im 50/95]
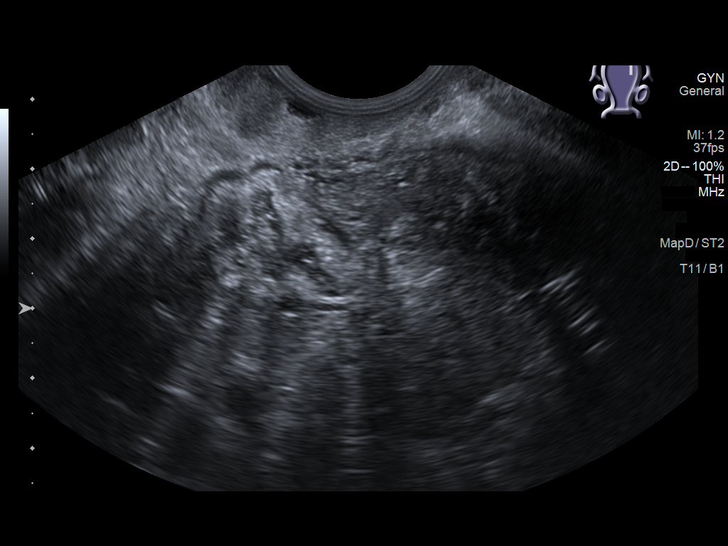
[im 58/95]
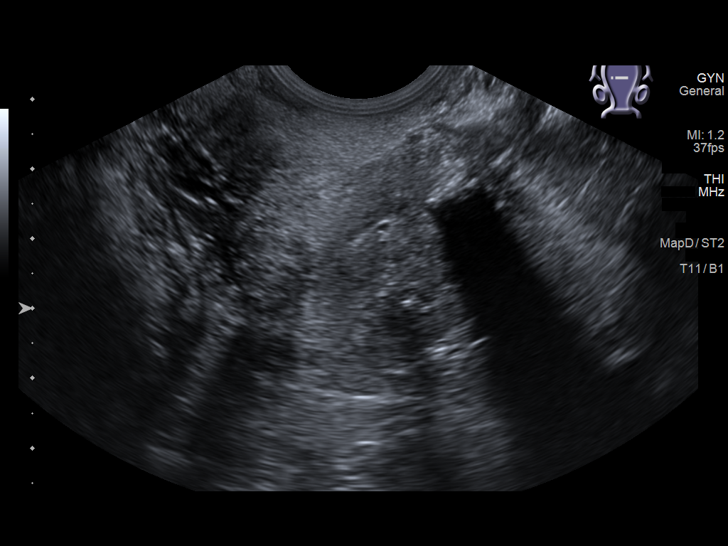
[im 66/95]
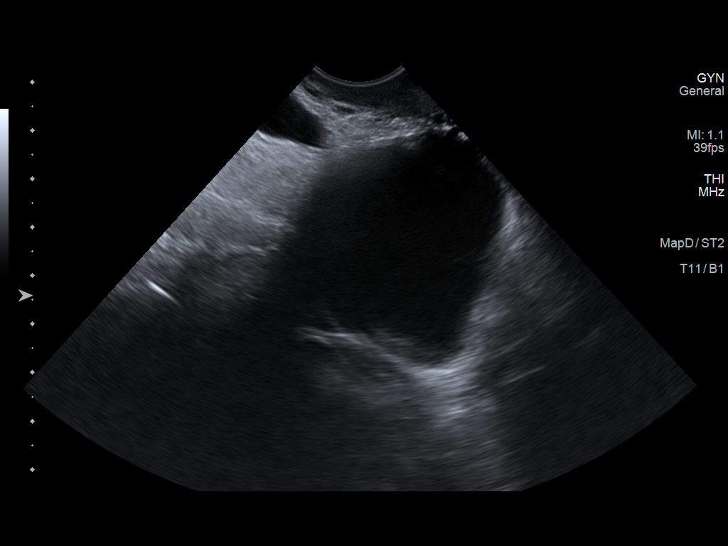
[im 74/95]
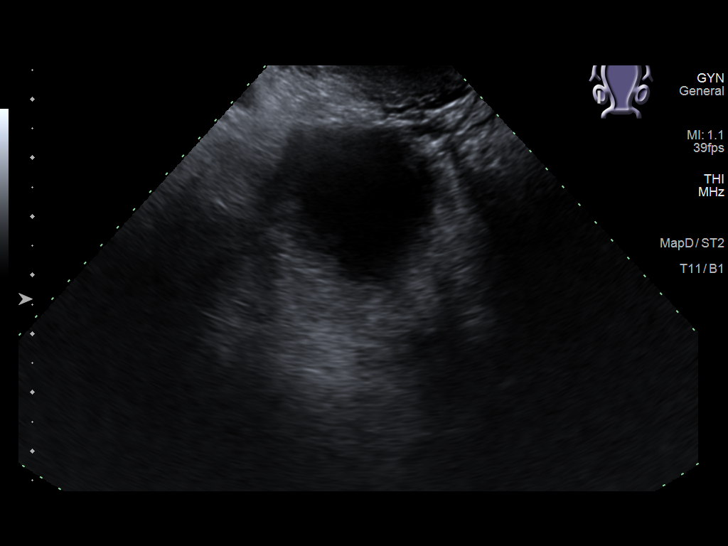
[im 82/95]
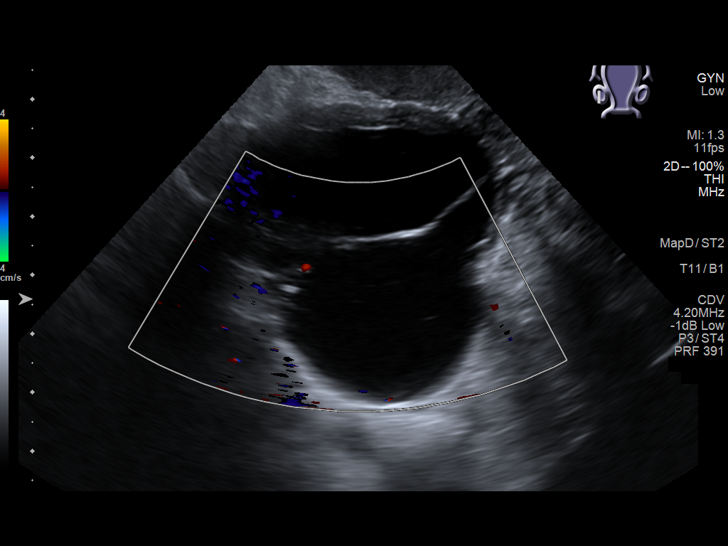
[im 90/95]
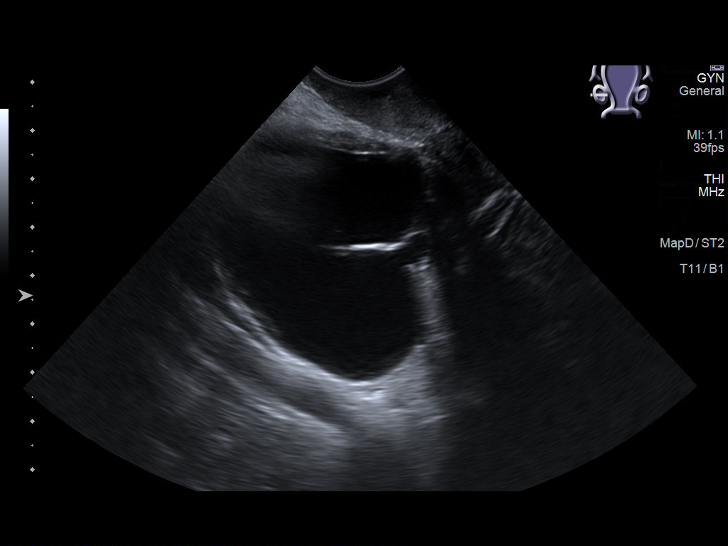

[Series 1001: us pelvis complete with transvaginal · 0.15mm/px · 1 of 1 slices shown (2 of 2)]
[im 1/1]
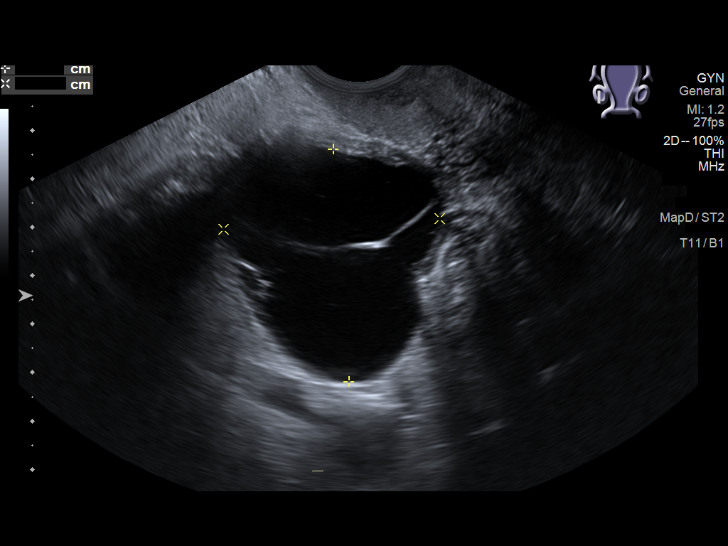

[13 of 25 positions shown; findings below may reference images not displayed]

FINDINGS: Uterus

Measurements: 5.6 x 3.1 x 4.2 cm = volume: 39 mL. Retroverted.
Calcified leiomyoma at upper uterus 14 x 9 mm, with posterior
shadowing. No additional masses.

Endometrium

Thickness: 2 mm.  No endometrial fluid or focal abnormality

Right ovary

Measurements: 4.8 x 5.3 x 4.6 cm = volume: 61 mL. Complicated cyst
within RIGHT ovary 4.6 x 4.8 x 4.5 cm, containing multiple
septations, area of mural nodularity, and scattered internal echoes.
Some blood flow is seen within the soft tissue components. Ovarian
neoplasm not excluded.

Left ovary

Not visualized, likely obscured by bowel

Other findings

No free pelvic fluid.  No additional pelvic masses.
IMPRESSION: Small calcified leiomyoma at upper uterus.

Nonvisualization LEFT ovary.

Complex cystic lesion of the RIGHT ovary 4.8 cm in greatest size
containing septations and mural nodularity, some which contains
internal blood flow; findings concerning for a cystic ovarian
neoplasm and surgical evaluation is recommended.

This recommendation follows the consensus statement: Management of
Asymptomatic Ovarian and Other Adnexal Cysts Imaged at US: Society
of Radiologists in Ultrasound Consensus Conference Statement.

These results will be called to the ordering clinician or
representative by the Radiologist Assistant, and communication
documented in the PACS or [REDACTED].

## 2020-04-22 ENCOUNTER — Telehealth: Payer: Self-pay | Admitting: *Deleted

## 2020-04-22 ENCOUNTER — Other Ambulatory Visit: Payer: Self-pay

## 2020-04-22 DIAGNOSIS — N83201 Unspecified ovarian cyst, right side: Secondary | ICD-10-CM

## 2020-04-22 DIAGNOSIS — N83202 Unspecified ovarian cyst, left side: Secondary | ICD-10-CM

## 2020-04-22 NOTE — Telephone Encounter (Signed)
Patient called to request Dr. B to review her pelvic ultrasound results. She would like to move her apts up if needed.

## 2020-04-22 NOTE — Telephone Encounter (Signed)
Per Mariea Clonts, RN . She will reach out to the patient to discuss gyn onc apts.

## 2020-04-22 NOTE — Progress Notes (Signed)
Called and spoke with Christy Summers. She is scheduled for 5-5 with gyn onc to evaluate ovarian cyst

## 2020-04-23 ENCOUNTER — Encounter: Payer: Self-pay | Admitting: Obstetrics and Gynecology

## 2020-04-23 ENCOUNTER — Telehealth: Payer: Self-pay | Admitting: Internal Medicine

## 2020-04-23 ENCOUNTER — Other Ambulatory Visit: Payer: Self-pay

## 2020-04-23 ENCOUNTER — Inpatient Hospital Stay: Payer: PPO | Attending: Obstetrics and Gynecology | Admitting: Obstetrics and Gynecology

## 2020-04-23 DIAGNOSIS — N838 Other noninflammatory disorders of ovary, fallopian tube and broad ligament: Secondary | ICD-10-CM

## 2020-04-23 DIAGNOSIS — R197 Diarrhea, unspecified: Secondary | ICD-10-CM | POA: Diagnosis not present

## 2020-04-23 DIAGNOSIS — I10 Essential (primary) hypertension: Secondary | ICD-10-CM | POA: Insufficient documentation

## 2020-04-23 DIAGNOSIS — N83201 Unspecified ovarian cyst, right side: Secondary | ICD-10-CM | POA: Insufficient documentation

## 2020-04-23 DIAGNOSIS — D890 Polyclonal hypergammaglobulinemia: Secondary | ICD-10-CM | POA: Insufficient documentation

## 2020-04-23 DIAGNOSIS — D649 Anemia, unspecified: Secondary | ICD-10-CM | POA: Insufficient documentation

## 2020-04-23 DIAGNOSIS — Z7982 Long term (current) use of aspirin: Secondary | ICD-10-CM | POA: Insufficient documentation

## 2020-04-23 DIAGNOSIS — Z79899 Other long term (current) drug therapy: Secondary | ICD-10-CM | POA: Insufficient documentation

## 2020-04-23 NOTE — H&P (View-Only) (Signed)
Gynecologic Oncology Consult Visit   Referring Provider: Dr Rogue Bussing  Chief Concern: ovarian cysts  Subjective:  Christy Summers is a 69 y.o. P75 female who is seen in consultation from Dr. Rogue Bussing for ovarian cysts.  History of chronic anemia unclear etiology; and also chronic diarrhea and polyclonal gammopathy  #2018-Anemia hemoglobin 9-10/normocytic-second to chronic inflammation//C. 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 C.diff s/p Vanco PO  [Dr.Elliot; [place]]  Patient interim had a CT scan with her PCP noted to have an abnormal approximately 4-5 cm right adnexal mass concerning for malignancy.    Patient continues to have ongoing diarrhea.  However recent work-up for CVA was negative.   Mild to moderate fatigue.  Denies any pain.  She had CT scan 04/10/20 due to elevated alk phos and showed 4-5 cm cyst on right ovary. Previously had been 2.2 cm on CT scan in 6/18, unchanged in 3/19.   [place] 04/21/20 Uterus Measurements: 5.6 x 3.1 x 4.2 cm = volume: 39 mL. Retroverted. Calcified leiomyoma at upper uterus 14 x 9 mm, with posterior shadowing. No additional masses.Right ovary Measurements: 4.8 x 5.3 x 4.6 cm = volume: 61 mL. Complicated cyst within RIGHT ovary 4.6 x 4.8 x 4.5 cm, containing multiple septations, area of mural nodularity, and scattered internal echoes. Some blood flow is seen within the soft tissue components. Ovarian neoplasm not excluded.  Left ovary: Not visualized, likely obscured by bowel No free pelvic fluid.  No additional pelvic masses.  IMPRESSION: Small calcified leiomyoma at upper uterus. Nonvisualization LEFT ovary. 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   CA125 done 4/30 = 4.1  She has no  pelvic pain or other symptoms from the cyst.  Normal PAP a few years ago.  Problem List: Patient Active Problem List   Diagnosis Date Noted  . Normocytic anemia 12/29/2018    Past Medical History: Past Medical History:  Diagnosis Date  . Elevated lipids   . Hypertension     Past Surgical History: Past Surgical History:  Procedure Laterality Date  . CHOLECYSTECTOMY    . COLONOSCOPY WITH PROPOFOL N/A 09/07/2017   Procedure: COLONOSCOPY WITH PROPOFOL;  Surgeon: Toledo, Benay Pike, MD;  Location: [facility] ENDOSCOPY;  Service: Endoscopy;  Laterality: N/A;  . COLONOSCOPY WITH PROPOFOL N/A 01/22/2019   Procedure: COLONOSCOPY WITH PROPOFOL;  Surgeon: Manya Silvas, MD;  Location: [facility] ENDOSCOPY;  Service: Endoscopy;  Laterality: N/A;  . ESOPHAGOGASTRODUODENOSCOPY (EGD) WITH PROPOFOL N/A 09/07/2017   Procedure: ESOPHAGOGASTRODUODENOSCOPY (EGD) WITH PROPOFOL;  Surgeon: Toledo, Benay Pike, MD;  Location: [facility] ENDOSCOPY;  Service: Endoscopy;  Laterality: N/A;     OB History:  OB History  No obstetric history on file.    Family History: Family History  Problem Relation Age of Onset  . Breast cancer Neg Hx     Social History: Social History   Socioeconomic History  . Marital status: Divorced    Spouse name: Not on file  . Number of children: Not on file  . Years of education: Not on file  . Highest education level: Not on file  Occupational History  . Not on file  Tobacco Use  . Smoking status: Never Smoker  . Smokeless tobacco: Never Used  Substance and Sexual Activity  . Alcohol use: Not Currently  . Drug use: Not  Currently  . Sexual activity: Not on file  Other Topics Concern  . Not on file  Social History Narrative   She is retired from [employer] with family in [place].  No smoking no alcohol.   Social Determinants of Health   Financial Resource Strain:   . Difficulty of Paying Living Expenses:   Food Insecurity:   . Worried About Charity fundraiser in  the Last Year:   . Arboriculturist in the Last Year:   Transportation Needs:   . Film/video editor (Medical):   Marland Kitchen Lack of Transportation (Non-Medical):   Physical Activity:   . Days of Exercise per Week:   . Minutes of Exercise per Session:   Stress:   . Feeling of Stress :   Social Connections:   . Frequency of Communication with Friends and Family:   . Frequency of Social Gatherings with Friends and Family:   . Attends Religious Services:   . Active Member of Clubs or Organizations:   . Attends Archivist Meetings:   Marland Kitchen Marital Status:   Intimate Partner Violence:   . Fear of Current or Ex-Partner:   . Emotionally Abused:   Marland Kitchen Physically Abused:   . Sexually Abused:     Allergies: Allergies  Allergen Reactions  . Mesalamine Diarrhea and Nausea Only    Current Medications: Current Outpatient Medications  Medication Sig Dispense Refill  . amLODipine (NORVASC) 10 MG tablet Take 10 mg by mouth daily.    . Ascorbic Acid (VITAMIN C) 500 MG CAPS Take 1 capsule by mouth daily.     Marland Kitchen aspirin 81 MG chewable tablet Chew 81 mg by mouth daily.    . calcium carbonate (TUMS) 500 MG chewable tablet Chew 1 tablet by mouth daily.     . Cholecalciferol (VITAMIN D-1000 MAX ST) 25 MCG (1000 UT) tablet Take 1,000 Units by mouth daily.     . cyanocobalamin 1000 MCG tablet Take 1,000 mcg by mouth daily.    . iron polysaccharides (NIFEREX) 150 MG capsule Take 150 mg by mouth daily.     Marland Kitchen losartan-hydrochlorothiazide (HYZAAR) 100-25 MG tablet Take 1 tablet by mouth daily.    . potassium chloride (K-DUR) 10 MEQ tablet Take 10 mEq by mouth 2 (two) times daily.      No current facility-administered medications for this visit.    Review of Systems General: negative for, fevers, chills, fatigue, changes in sleep, changes in weight or appetite Skin: negative for changes in color, texture, moles or lesions Eyes: negative for, changes in vision, pain, diplopia HEENT: negative for,  change in hearing, pain, discharge, tinnitus, vertigo, voice changes, sore throat, neck masses Breasts: negative for breast lumps Pulmonary: negative for, dyspnea, orthopnea, productive cough Cardiac: negative for, palpitations, syncope, pain, discomfort, pressure Gastrointestinal: negative for, dysphagia, nausea, vomiting, jaundice, pain, constipation, hematemesis, hematochezia Genitourinary/Sexual: negative for, dysuria, discharge, hesitancy, nocturia, retention, stones, infections, STD's, incontinence Ob/Gyn: negative for, irregular bleeding, pain Musculoskeletal: negative for, pain, stiffness, swelling, range of motion limitation Hematology: negative for, easy bruising, bleeding Neurologic/Psych: negative for, headaches, seizures, paralysis, weakness, tremor, change in gait, change in sensation, mood swings, depression, anxiety, change in memory  Objective:  Physical Examination:  BP (!) 129/59   Pulse 85   Temp 98.2 F (36.8 C) (Tympanic)   Resp 16   Wt 216 lb 3.2 oz (98.1 kg)   BMI 32.87 kg/m    ECOG Performance Status: 0 - Asymptomatic  General appearance: alert,  cooperative and appears stated age HEENT:extra ocular movement intact and thyroid without masses Lymph node survey: non-palpable, axillary, inguinal, supraclavicular Cardiovascular: regular rate and rhythm, no murmurs or gallops Respiratory: normal air entry, lungs clear to auscultation and no rales, rhonchi or wheezing Breast exam: not examined. Abdomen: no hernias and well healed incision Back: inspection of back is normal Extremities: extremities normal, atraumatic, no cyanosis or edema Skin exam - normal coloration and turgor, no rashes, no suspicious skin lesions noted. Neurological exam reveals alert, oriented, normal speech, no focal findings or movement disorder noted.  Pelvic: exam chaperoned by nurse;  Vulva: normal appearing vulva with no masses, tenderness or lesions; Vagina: normal vagina; Adnexa:  normal adnexa in size, nontender and no masses; Uterus: uterus is normal size, shape, consistency and nontender; Cervix: anteverted; Rectal: not indicated    Lab Review Lab Results  Component Value Date   WBC 11.5 (H) 04/18/2020   HGB 9.7 (L) 04/18/2020   HCT 30.0 (L) 04/18/2020   MCV 92.3 04/18/2020   PLT 435 (H) 04/18/2020     Chemistry      Component Value Date/Time   NA 135 04/18/2020 0909   K 3.7 04/18/2020 0909   CL 104 04/18/2020 0909   CO2 22 04/18/2020 0909   BUN 19 04/18/2020 0909   CREATININE 1.00 04/18/2020 0909      Component Value Date/Time   CALCIUM 8.7 (L) 04/18/2020 0909          Assessment:  Christy Summers is a 69 y.o. 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 Based on presence since 2018, lack of symptoms, small size and normal CA125 this is not likely to be a life threatening cancer, but could be a borderline tumor, cystadenoma or other neoplasm.   Medical co-morbidities complicating care: chronic normocytic anemia Hct 30%, history of C diff.  Plan:   Problem List Items Addressed This Visit      Other   Ovarian mass, right     We discussed options for management including removal of the right adnexa.  She would prefer to have ovaries and uterus removed completely to avoid future problems, and this is certainly reasonable. Also discussed the need for surgical staging if frozen section shows cancer. She will be scheduled for TLH, BSO, possible surgical staging with pelvic/aortic lymph node sampling, possible X lap on 04/30/20. Discussed with Dr. Rogue Bussing and he does not consider her anemia a contraindication for surgery.  Patient understands small risk for blood transfusion and has not issues with this.    The risks of surgery were discussed in detail  and she understands these to include infection; wound separation; hernia; vaginal cuff separation, injury to adjacent organs such as bowel, bladder, blood vessels, ureters and nerves; bleeding which may require blood transfusion; anesthesia risk; thromboembolic events; possible death; unforeseen complications; possible need for re-exploration; medical complications such as heart attack, stroke, pleural effusion and pneumonia; and, if staging performed the risk of lymphedema and lymphocyst.  The patient will receive DVT and antibiotic prophylaxis as indicated.  She voiced a clear understanding.  She had the opportunity to ask questions and written informed consent was obtained today.  The patient's diagnosis, an outline of the further diagnostic and laboratory studies which will be required, the recommendation, and alternatives were discussed.  All questions were answered to the patient's  satisfaction.  A total of 40 minutes were spent with the patient/family today; 40% was spent in education, counseling and coordination of care for right ovarian mass.    Mellody Drown, MD  CC:  Tracie Harrier, MD [address] [place],  [place] [address] [phone]

## 2020-04-23 NOTE — Telephone Encounter (Signed)
Spoke with patient on 5/5-regarding upcoming surgery for ovarian mass.  Chronic anemia hemoglobin 9-10; clinically stable to proceed with surgery.  Discussed with Dr. Fransisca Connors.

## 2020-04-23 NOTE — Patient Instructions (Signed)
I will contact you with your pre-admission testing appointment and COVID testing appointment        DIVISION OF GYNECOLOGIC ONCOLOGY BOWEL PREP   The following instructions are extremely important to prepare for your surgery. Please follow them carefully   Step 1: Liquid Diet Instructions              Clear Liquid Diet for GYN Oncology Patients Day Before Surgery The day before your scheduled surgery DO NOT EAT any solid foods.  We do want you to drink enough liquids, but NO MILK products.  We do not want you to be dehydrated.  Clear liquids are defined as no milk products and no pieces of any solid food. Drink at least 64 oz. of fluid.  The following are all approved for you to drink the day before you surgery.  Chicken, Beef or Vegetable Broth (bouillon or consomm) - NO BROTH AFTER MIDNIGHT  Plain Jello  (no fruit)  Water  Strained lemonade or fruit punch  Gatorade (any flavor)  CLEAR Ensure or Boost Breeze  Fruit juices without pulp, such as apple, grape, or cranberry juice  Clear sodas - NO SODA AFTER MIDNIGHT  Ice Pops without bits of fruit or fruit pulp  Honey  Tea or coffee without milk or cream                 Any foods not on the above list should be avoided                                                                                             Step 2: Laxatives           The evening before surgery:   Time: around 5pm   Follow these instructions carefully.   Administer 1 Dulcolax suppository according to manufacturer instructions on the box. You will need to purchase this laxative at a pharmacy or grocery store.    Individual responses to laxatives vary; this prep may cause multiple bowel movements. It often works in 30 minutes and may take as long as 3 hours. Stay near an available bathroom.    It is important to stay hydrated. Ensure you are still drinking clear liquids.     IMPORTANT: FOR YOUR SAFETY, WE WILL HAVE TO CANCEL YOUR  SURGERY IF YOU DO NOT FOLLOW THESE INSTRUCTIONS.    Do not eat anything after midnight (including gum or candy) prior to your surgery.  Avoid drinking carbonated beverages after midnight.  You can have clear liquids up until one hour before you arrive at the hospital. "Nothing by mouth" means no liquids, gum, candy, etc for one hour before your arrival time.      Laparoscopy Laparoscopy is a procedure to diagnose diseases in the abdomen. During the procedure, a thin, lighted, pencil-sized instrument called a laparoscope is inserted into the abdomen through an incision. The laparoscope allows your health care provider to look at the organs inside your body. LET [facility] CARE PROVIDER KNOW ABOUT:  Any allergies you have.  All medicines you are taking, including vitamins, herbs, eye drops, creams, and over-the-counter  medicines.  Previous problems you or members of your family have had with the use of anesthetics.  Any blood disorders you have.  Previous surgeries you have had.  Medical conditions you have. RISKS AND COMPLICATIONS  Generally, this is a safe procedure. However, problems can occur, which may include:  Infection.  Bleeding.  Damage to other organs.  Allergic reaction to the anesthetics used during the procedure. BEFORE THE PROCEDURE  Do not eat or drink anything after midnight on the night before the procedure or as directed by your health care provider.  Ask your health care provider about: ? Changing or stopping your regular medicines. ? Taking medicines such as aspirin and ibuprofen. These medicines can thin your blood. Do not take these medicines before your procedure if your health care provider instructs you not to.  Plan to have someone take you home after the procedure. PROCEDURE  You may be given a medicine to help you relax (sedative).  You will be given a medicine to make you sleep (general anesthetic).  Your abdomen will be inflated with a  gas. This will make your organs easier to see.  Small incisions will be made in your abdomen.  A laparoscope and other small instruments will be inserted into the abdomen through the incisions.  A tissue sample may be removed from an organ in the abdomen for examination.  The instruments will be removed from the abdomen.  The gas will be released.  The incisions will be closed with stitches (sutures). AFTER THE PROCEDURE  Your blood pressure, heart rate, breathing rate, and blood oxygen level will be monitored often until the medicines you were given have worn off.   This information is not intended to replace advice given to you by your health care provider. Make sure you discuss any questions you have with your health care provider.                                             Bowel Symptoms After Surgery After gynecologic surgery, women often have temporary changes in bowel function (constipation and gas pain).  Following are tips to help prevent and treat common bowel problems.  It also tells you when to call the doctor.  This is important because some symptoms might be a sign of a more serious bowel problem such as obstruction (bowel blockage).  These problems are rare but can happen after gynecologic surgery.   Besides surgery, what can temporarily affect bowel function? 1. Dietary changes   2. Decreased physical activity   3.Antibiotics   4. Pain medication   How can I prevent constipation (three days or more without a stool)? 1. Include fiber in your diet: whole grains, raw or dried fruits & vegetables, prunes, prune/pear juiceDrink at least 8 glasses of liquid (preferably water) every day 2. Avoid: ? Gas forming foods such as broccoli, beans, peas, salads, cabbage, sweet potatoes ? Greasy, fatty, or fried foods 3. Activity helps bowel function return to normal, walk around the house at least 3-4 times each day for 15 minutes or longer, if tolerated.  Rocking in a rocking  chair is preferable to sitting still. 4. Stool softeners: these are not laxatives, but serve to soften the stool to avoid straining.  Take 2-4 times a day until normal bowel function returns         Examples: Colace  or generic equivalent (Docusate) 5. Bulk laxatives: provide a concentrated source of fiber.  They do not stimulate the bowel.  Take 1-2 times each day until normal bowel function return.              Examples: Citrucel, Metamucil, Fiberal, Fibercon   What can I take for "Gas Pains"? 1. Simethicone (Mylicon, Gas-X, Maalox-Gas, Mylanta-Gas) take 3-4 times a day 2. Maalox Regular - take 3-4 times a day 3. Mylanta Regular - take 3-4 times a day   What can I take if I become constipated? 1. Start with stool softeners and add additional laxatives below as needed to have a bowel movement every 1-2 days  2. Stool softeners 1-2 tablets, 2 times a day 3. Senokot 1-2 tablets, 1-2 times a day 4. Glycerin suppository can soften hard stool take once a day 5. Bisacodyl suppository once a day  6. Milk of Magnesia 30 mL 1-2 times a day 7. Fleets or tap water enema    What can I do for nausea?  1. Limit most solid foods for 24-48 hours 2. Continue eating small frequent amounts of liquids and/or bland soft foods ? Toast, crackers, cooked cereal (grits, cream of wheat, rice) 3. Benadryl: a mild anti-nausea medicine can be obtained without a prescription. May cause drowsiness, especially if taken with narcotic pain medicines 4. Contact provider for prescription nausea medication     What can I do, or take for diarrhea (more than five loose stools per day)? 1. Drink plenty of clear fluids to prevent dehydration 2. May take Kaopectate, Pepto-Bismol, Imodium, or probiotics for 1-2 days 3. Anusol or Preparation-H can be helpful for hemorrhoids and irritated tissue around anus   When should I call the doctor?             CONSTIPATION:   Not relieved after three days following the above  program VOMITING:  That contains blood, "coffee ground" material  More the three times/hour and unable to keep down nausea medication for more than eight hours  With dry mouth, dark or strong urine, feeling light-headed, dizzy, or confused  With severe abdominal pain or bloating for more than 24 hours DIARRHEA:  That continues for more then 24-48 hours despite treatment  That contains blood or tarry material  With dry mouth, dark or strong urine, feeling light~headed, dizzy, or confused FEVER:  101 F or higher along with nausea, vomiting, gas pain, diarrhea UNABLE TO:  Pass gas from rectum for more than 24 hours  Tolerate liquids by mouth for more than 24 hours        Laparoscopic Hysterectomy, Care After Refer to this sheet in the next few weeks. These instructions provide you with information on caring for yourself after your procedure. Your health care provider may also give you more specific instructions. Your treatment has been planned according to current medical practices, but problems sometimes occur. Call your health care provider if you have any problems or questions after your procedure. What can I expect after the procedure?  Pain and bruising at the incision sites. You will be given pain medicine to control it.  Menopausal symptoms such as hot flashes, night sweats, and insomnia if your ovaries were removed.  Sore throat from the breathing tube that was inserted during surgery. Follow these instructions at home:  Only take over-the-counter or prescription medicines for pain, discomfort, or fever as directed by your health care provider.  Do not take aspirin. It can cause bleeding.  Do  not drive when taking pain medicine.  Follow your health care provider's advice regarding diet, exercise, lifting, driving, and general activities.  Resume your usual diet as directed and allowed.  Get plenty of rest and sleep.  Do not douche, use tampons, or have sexual  intercourse for at least 6 weeks, or until your health care provider gives you permission.  Change your bandages (dressings) as directed by your health care provider.  Monitor your temperature and notify your health care provider of a fever.  Take showers instead of baths for 2-3 weeks.  Do not drink alcohol until your health care provider gives you permission.  If you develop constipation, you may take a mild laxative with your health care provider's permission. Bran foods may help with constipation problems. Drinking enough fluids to keep your urine clear or pale yellow may help as well.  Try to have someone home with you for 1-2 weeks to help around the house.  Keep all of your follow-up appointments as directed by your health care provider. Contact a health care provider if:  You have swelling, redness, or increasing pain around your incision sites.  You have pus coming from your incision.  You notice a bad smell coming from your incision.  Your incision breaks open.  You feel dizzy or lightheaded.  You have pain or bleeding when you urinate.  You have persistent diarrhea.  You have persistent nausea and vomiting.  You have abnormal vaginal discharge.  You have a rash.  You have any type of abnormal reaction or develop an allergy to your medicine.  You have poor pain control with your prescribed medicine. Get help right away if:  You have chest pain or shortness of breath.  You have severe abdominal pain that is not relieved with pain medicine.  You have pain or swelling in your legs. This information is not intended to replace advice given to you by your health care provider. Make sure you discuss any questions you have with your health care provider. Document Released: 09/26/2013 Document Revised: 05/13/2016 Document Reviewed: 06/26/2013 Elsevier Interactive Patient Education  2017 [employer].

## 2020-04-23 NOTE — Progress Notes (Signed)
Gynecologic Oncology Consult Visit   Referring Provider: Dr Rogue Bussing  Chief Concern: ovarian cysts  Subjective:  Christy Summers is a 69 y.o. P24 female who is seen in consultation from Dr. Rogue Bussing for ovarian cysts.  History of chronic anemia unclear etiology; and also chronic diarrhea and polyclonal gammopathy  #2018-Anemia hemoglobin 9-10/normocytic-second to chronic inflammation//C. 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 C.diff s/p Vanco PO  [Dr.Elliot; [facility]]  Patient interim had a CT scan with her PCP noted to have an abnormal approximately 4-5 cm right adnexal mass concerning for malignancy.    Patient continues to have ongoing diarrhea.  However recent work-up for CVA was negative.   Mild to moderate fatigue.  Denies any pain.  She had CT scan 04/10/20 due to elevated alk phos and showed 4-5 cm cyst on right ovary. Previously had been 2.2 cm on CT scan in 6/18, unchanged in 3/19.   [place] 04/21/20 Uterus Measurements: 5.6 x 3.1 x 4.2 cm = volume: 39 mL. Retroverted. Calcified leiomyoma at upper uterus 14 x 9 mm, with posterior shadowing. No additional masses.Right ovary Measurements: 4.8 x 5.3 x 4.6 cm = volume: 61 mL. Complicated cyst within RIGHT ovary 4.6 x 4.8 x 4.5 cm, containing multiple septations, area of mural nodularity, and scattered internal echoes. Some blood flow is seen within the soft tissue components. Ovarian neoplasm not excluded.  Left ovary: Not visualized, likely obscured by bowel No free pelvic fluid.  No additional pelvic masses.  IMPRESSION: Small calcified leiomyoma at upper uterus. Nonvisualization LEFT ovary. 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   CA125 done 4/30 = 4.1  She has no  pelvic pain or other symptoms from the cyst.  Normal PAP a few years ago.  Problem List: Patient Active Problem List   Diagnosis Date Noted  . Normocytic anemia 12/29/2018    Past Medical History: Past Medical History:  Diagnosis Date  . Elevated lipids   . Hypertension     Past Surgical History: Past Surgical History:  Procedure Laterality Date  . CHOLECYSTECTOMY    . COLONOSCOPY WITH PROPOFOL N/A 09/07/2017   Procedure: COLONOSCOPY WITH PROPOFOL;  Surgeon: Toledo, Benay Pike, MD;  Location: [facility] ENDOSCOPY;  Service: Endoscopy;  Laterality: N/A;  . COLONOSCOPY WITH PROPOFOL N/A 01/22/2019   Procedure: COLONOSCOPY WITH PROPOFOL;  Surgeon: Manya Silvas, MD;  Location: [facility] ENDOSCOPY;  Service: Endoscopy;  Laterality: N/A;  . ESOPHAGOGASTRODUODENOSCOPY (EGD) WITH PROPOFOL N/A 09/07/2017   Procedure: ESOPHAGOGASTRODUODENOSCOPY (EGD) WITH PROPOFOL;  Surgeon: Toledo, Benay Pike, MD;  Location: [facility] ENDOSCOPY;  Service: Endoscopy;  Laterality: N/A;     OB History:  OB History  No obstetric history on file.    Family History: Family History  Problem Relation Age of Onset  . Breast cancer Neg Hx     Social History: Social History   Socioeconomic History  . Marital status: Divorced    Spouse name: Not on file  . Number of children: Not on file  . Years of education: Not on file  . Highest education level: Not on file  Occupational History  . Not on file  Tobacco Use  . Smoking status: Never Smoker  . Smokeless tobacco: Never Used  Substance and Sexual Activity  . Alcohol use: Not Currently  . Drug use: Not  Currently  . Sexual activity: Not on file  Other Topics Concern  . Not on file  Social History Narrative   She is retired from [employer] with family in [place].  No smoking no alcohol.   Social Determinants of Health   Financial Resource Strain:   . Difficulty of Paying Living Expenses:   Food Insecurity:   . Worried About Charity fundraiser in  the Last Year:   . Arboriculturist in the Last Year:   Transportation Needs:   . Film/video editor (Medical):   Marland Kitchen Lack of Transportation (Non-Medical):   Physical Activity:   . Days of Exercise per Week:   . Minutes of Exercise per Session:   Stress:   . Feeling of Stress :   Social Connections:   . Frequency of Communication with Friends and Family:   . Frequency of Social Gatherings with Friends and Family:   . Attends Religious Services:   . Active Member of Clubs or Organizations:   . Attends Archivist Meetings:   Marland Kitchen Marital Status:   Intimate Partner Violence:   . Fear of Current or Ex-Partner:   . Emotionally Abused:   Marland Kitchen Physically Abused:   . Sexually Abused:     Allergies: Allergies  Allergen Reactions  . Mesalamine Diarrhea and Nausea Only    Current Medications: Current Outpatient Medications  Medication Sig Dispense Refill  . amLODipine (NORVASC) 10 MG tablet Take 10 mg by mouth daily.    . Ascorbic Acid (VITAMIN C) 500 MG CAPS Take 1 capsule by mouth daily.     Marland Kitchen aspirin 81 MG chewable tablet Chew 81 mg by mouth daily.    . calcium carbonate (TUMS) 500 MG chewable tablet Chew 1 tablet by mouth daily.     . Cholecalciferol (VITAMIN D-1000 MAX ST) 25 MCG (1000 UT) tablet Take 1,000 Units by mouth daily.     . cyanocobalamin 1000 MCG tablet Take 1,000 mcg by mouth daily.    . iron polysaccharides (NIFEREX) 150 MG capsule Take 150 mg by mouth daily.     Marland Kitchen losartan-hydrochlorothiazide (HYZAAR) 100-25 MG tablet Take 1 tablet by mouth daily.    . potassium chloride (K-DUR) 10 MEQ tablet Take 10 mEq by mouth 2 (two) times daily.      No current facility-administered medications for this visit.    Review of Systems General: negative for, fevers, chills, fatigue, changes in sleep, changes in weight or appetite Skin: negative for changes in color, texture, moles or lesions Eyes: negative for, changes in vision, pain, diplopia HEENT: negative for,  change in hearing, pain, discharge, tinnitus, vertigo, voice changes, sore throat, neck masses Breasts: negative for breast lumps Pulmonary: negative for, dyspnea, orthopnea, productive cough Cardiac: negative for, palpitations, syncope, pain, discomfort, pressure Gastrointestinal: negative for, dysphagia, nausea, vomiting, jaundice, pain, constipation, hematemesis, hematochezia Genitourinary/Sexual: negative for, dysuria, discharge, hesitancy, nocturia, retention, stones, infections, STD's, incontinence Ob/Gyn: negative for, irregular bleeding, pain Musculoskeletal: negative for, pain, stiffness, swelling, range of motion limitation Hematology: negative for, easy bruising, bleeding Neurologic/Psych: negative for, headaches, seizures, paralysis, weakness, tremor, change in gait, change in sensation, mood swings, depression, anxiety, change in memory  Objective:  Physical Examination:  BP (!) 129/59   Pulse 85   Temp 98.2 F (36.8 C) (Tympanic)   Resp 16   Wt 216 lb 3.2 oz (98.1 kg)   BMI 32.87 kg/m    ECOG Performance Status: 0 - Asymptomatic  General appearance: alert,  cooperative and appears stated age HEENT:extra ocular movement intact and thyroid without masses Lymph node survey: non-palpable, axillary, inguinal, supraclavicular Cardiovascular: regular rate and rhythm, no murmurs or gallops Respiratory: normal air entry, lungs clear to auscultation and no rales, rhonchi or wheezing Breast exam: not examined. Abdomen: no hernias and well healed incision Back: inspection of back is normal Extremities: extremities normal, atraumatic, no cyanosis or edema Skin exam - normal coloration and turgor, no rashes, no suspicious skin lesions noted. Neurological exam reveals alert, oriented, normal speech, no focal findings or movement disorder noted.  Pelvic: exam chaperoned by nurse;  Vulva: normal appearing vulva with no masses, tenderness or lesions; Vagina: normal vagina; Adnexa:  normal adnexa in size, nontender and no masses; Uterus: uterus is normal size, shape, consistency and nontender; Cervix: anteverted; Rectal: not indicated    Lab Review Lab Results  Component Value Date   WBC 11.5 (H) 04/18/2020   HGB 9.7 (L) 04/18/2020   HCT 30.0 (L) 04/18/2020   MCV 92.3 04/18/2020   PLT 435 (H) 04/18/2020     Chemistry      Component Value Date/Time   NA 135 04/18/2020 0909   K 3.7 04/18/2020 0909   CL 104 04/18/2020 0909   CO2 22 04/18/2020 0909   BUN 19 04/18/2020 0909   CREATININE 1.00 04/18/2020 0909      Component Value Date/Time   CALCIUM 8.7 (L) 04/18/2020 0909          Assessment:  Christy Summers is a 68 y.o. 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 Based on presence since 2018, lack of symptoms, small size and normal CA125 this is not likely to be a life threatening cancer, but could be a borderline tumor, cystadenoma or other neoplasm.   Medical co-morbidities complicating care: chronic normocytic anemia Hct 30%, history of C diff.  Plan:   Problem List Items Addressed This Visit      Other   Ovarian mass, right     We discussed options for management including removal of the right adnexa.  She would prefer to have ovaries and uterus removed completely to avoid future problems, and this is certainly reasonable. Also discussed the need for surgical staging if frozen section shows cancer. She will be scheduled for TLH, BSO, possible surgical staging with pelvic/aortic lymph node sampling, possible X lap on 04/30/20. Discussed with Dr. Brahmanday and he does not consider her anemia a contraindication for surgery.  Patient understands small risk for blood transfusion and has not issues with this.    The risks of surgery were discussed in detail  and she understands these to include infection; wound separation; hernia; vaginal cuff separation, injury to adjacent organs such as bowel, bladder, blood vessels, ureters and nerves; bleeding which may require blood transfusion; anesthesia risk; thromboembolic events; possible death; unforeseen complications; possible need for re-exploration; medical complications such as heart attack, stroke, pleural effusion and pneumonia; and, if staging performed the risk of lymphedema and lymphocyst.  The patient will receive DVT and antibiotic prophylaxis as indicated.  She voiced a clear understanding.  She had the opportunity to ask questions and written informed consent was obtained today.  The patient's diagnosis, an outline of the further diagnostic and laboratory studies which will be required, the recommendation, and alternatives were discussed.  All questions were answered to the patient's   satisfaction.  A total of 40 minutes were spent with the patient/family today; 40% was spent in education, counseling and coordination of care for right ovarian mass.    Mellody Drown, MD  CC:  Tracie Harrier, MD [address] [place],  [place] [address] [phone]

## 2020-04-23 NOTE — Progress Notes (Signed)
Pre and post operative teaching completed. Provided printed education in AVS.  Will arrange PAT appointment and COVID testing appointment. I will contact her once these are arranged. Surgery will be scheduled for 5/12 with Dr. Leafy Ro and Dr. Fransisca Connors.

## 2020-04-23 NOTE — Progress Notes (Signed)
Pt has ovarian cyst- no pain. Only chronic diarrhea.

## 2020-04-24 ENCOUNTER — Telehealth: Payer: Self-pay

## 2020-04-24 NOTE — Telephone Encounter (Signed)
Called and notified Ms. Shuford, with her PAT appointment details. PAT appointment is scheduled for 5-10. Phone interview. COVID test scheduled for 5-11 at the [facility] drive thru. Directions to the [facility] given. Read back performed.

## 2020-04-28 ENCOUNTER — Other Ambulatory Visit: Payer: Self-pay

## 2020-04-28 ENCOUNTER — Encounter
Admission: RE | Admit: 2020-04-28 | Discharge: 2020-04-28 | Disposition: A | Discharge status: Home / Self Care | Payer: PPO | Source: Ambulatory Visit | Attending: Obstetrics and Gynecology | Admitting: Obstetrics and Gynecology

## 2020-04-28 DIAGNOSIS — Z01818 Encounter for other preprocedural examination: Secondary | ICD-10-CM | POA: Insufficient documentation

## 2020-04-28 DIAGNOSIS — Z20822 Contact with and (suspected) exposure to covid-19: Secondary | ICD-10-CM | POA: Diagnosis not present

## 2020-04-28 DIAGNOSIS — I10 Essential (primary) hypertension: Secondary | ICD-10-CM | POA: Diagnosis not present

## 2020-04-28 HISTORY — DX: Anemia, unspecified: D64.9

## 2020-04-28 NOTE — Patient Instructions (Addendum)
INSTRUCTIONS FOR SURGERY     Your surgery is scheduled for:   Wednesday, MAY 12TH     To find out your arrival time for the day of surgery,          please call [phone] between 1 pm and 3 pm on :  Tuesday, MAY 11TH     When you arrive for surgery, report to the [facility].       Do NOT stop on the first floor to register.    REMEMBER: Instructions that are not followed completely may result in serious medical risk,  up to and including death, or upon the discretion of your surgeon and anesthesiologist,            your surgery may need to be rescheduled.  __X__ 1. Do not eat food AS OF THE DAY PRIOR TO SURGERY.                   No gum, candy, lozenger, tic tacs, tums or hard candies.                  ABSOLUTELY NOTHING SOLID IN YOUR MOUTH AFTER MIDNIGHT                    You may drink unlimited clear liquids up to 2 hours before you are scheduled to arrive for surgery.                   Do not drink anything within those 2 hours unless you need to take medicine, then take the                   smallest amount you need.  Clear liquids include:  water, apple juice without pulp,                   any flavor Gatorade, Black coffee, black tea.  Sugar may be added but no dairy/ honey /lemon.                        Broth and jello is not considered a clear liquid.  __x__  2. On the morning of surgery, please brush your teeth with toothpaste and water. You may rinse with                  mouthwash if you wish but DO NOT SWALLOW TOOTHPASTE OR MOUTHWASH  __X___3. NO alcohol for 24 hours before or after surgery.  __x___ 4.  Do NOT smoke or use e-cigarettes for 24 HOURS PRIOR TO SURGERY.                      DO NOT Use any chewable tobacco products for at least 6 hours prior to surgery.  __x___ 5. If you start any new medication after this appointment and prior to surgery, please                   Bring it with  you on the day of surgery.  ___x__ 6. Notify your doctor if there is any change in your  medical condition, such as fever,                   infection, vomitting, diarrhea or any open sores.  __x___ 7.  USE the CHG SOAP as instructed, the night before surgery and the day of surgery.                   Once you have washed with this soap, do NOT use any of the following: Powders, perfumes                    or lotions. Please do not wear make up, hairpins, clips or nail polish. You MAY wear deodorant.                   Men may shave their face and neck.  Women need to shave 48 hours prior to surgery.                   DO NOT wear ANY jewelry on the day of surgery. If there are rings that are too tight to                    remove easily, please address this prior to the surgery day. Piercings need to be removed.                                                                     NO METAL ON YOUR BODY.                    Do NOT bring any valuables.  If you came to Pre-Admit testing then you will not need license,                     insurance card or credit card.  If you will be staying overnight, please either leave your things in                     the car or have your family be responsible for these items.                      IS NOT RESPONSIBLE FOR BELONGINGS OR VALUABLES.  ___X__ 8. DO NOT wear contact lenses on surgery day.  You may not have dentures,                     Hearing aides, contacts or glasses in the operating room. These items can be                    Placed in the Recovery Room to receive immediately after surgery.  __x___ 9. IF YOU ARE SCHEDULED TO GO HOME ON THE SAME DAY, YOU MUST                   Have someone to drive you home and to stay with you  for the first 24 hours.                    Have an arrangement prior to arriving on surgery day.  ___x__ 10. Take the following medications on the  morning of surgery with a sip of water:                               1.  AMLODIPINE                     2.                     3.                      _X____ 11.  Follow any instructions provided to you by your surgeon.                        Such as enema, clear liquid bowel prep  __X__  12. STOP ALL ASPIRIN PRODUCTS NOW.. MAY 10TH                       THIS INCLUDES BC POWDERS / GOODIES POWDER  __x___ 13. STOP Anti-inflammatories as of: NOW.. MAY 10TH                      This includes IBUPROFEN / MOTRIN / ADVIL / ALEVE/ NAPROXYN                    YOU MAY TAKE TYLENOL ANY TIME PRIOR TO SURGERY.  __X___ 18.  Stop supplements until after surgery.                     This includes: VITAMIN C //                  You may continue taking Vitamin B12 / Vitamin D3 but do not take on the morning of surgery.  ___X___17.  Continue to take the following medications but do not take on the morning of surgery:                          VITAMIN B AND VITAMIN B12 // TUMS // HYZAAR // POTASSIUM // NIFEREX  __X___18. If staying overnight, please have appropriate shoes to wear to be able to walk around the unit.                   Wear clean and comfortable clothing to the hospital.

## 2020-04-29 ENCOUNTER — Encounter
Admission: RE | Admit: 2020-04-29 | Discharge: 2020-04-29 | Disposition: A | Discharge status: Home / Self Care | Payer: PPO | Source: Ambulatory Visit | Attending: Obstetrics and Gynecology | Admitting: Obstetrics and Gynecology

## 2020-04-29 DIAGNOSIS — Z01818 Encounter for other preprocedural examination: Secondary | ICD-10-CM | POA: Diagnosis not present

## 2020-04-29 DIAGNOSIS — I10 Essential (primary) hypertension: Secondary | ICD-10-CM | POA: Diagnosis not present

## 2020-04-29 DIAGNOSIS — Z01810 Encounter for preprocedural cardiovascular examination: Secondary | ICD-10-CM | POA: Diagnosis not present

## 2020-04-29 LAB — COMPREHENSIVE METABOLIC PANEL
ALT: 27 U/L (ref 0–44)
AST: 19 U/L (ref 15–41)
Albumin: 3.8 g/dL (ref 3.5–5.0)
Alkaline Phosphatase: 213 U/L — ABNORMAL HIGH (ref 38–126)
Anion gap: 8 (ref 5–15)
BUN: 15 mg/dL (ref 8–23)
CO2: 25 mmol/L (ref 22–32)
Calcium: 9.2 mg/dL (ref 8.9–10.3)
Chloride: 101 mmol/L (ref 98–111)
Creatinine, Ser: 0.91 mg/dL (ref 0.44–1.00)
GFR calc Af Amer: >60 mL/min (ref >60)
GFR calc non Af Amer: >60 mL/min (ref >60)
Glucose, Bld: 105 mg/dL — ABNORMAL HIGH (ref 70–99)
Potassium: 3.6 mmol/L (ref 3.5–5.1)
Sodium: 134 mmol/L — ABNORMAL LOW (ref 135–145)
Total Bilirubin: 0.6 mg/dL (ref 0.3–1.2)
Total Protein: 9.3 g/dL — ABNORMAL HIGH (ref 6.5–8.1)

## 2020-04-29 LAB — CBC WITH DIFFERENTIAL/PLATELET
Abs Immature Granulocytes: 0.05 10*3/uL (ref 0.00–0.07)
Basophils Absolute: 0 10*3/uL (ref 0.0–0.1)
Basophils Relative: 0 %
Eosinophils Absolute: 0.5 10*3/uL (ref 0.0–0.5)
Eosinophils Relative: 5 %
HCT: 31.9 % — ABNORMAL LOW (ref 36.0–46.0)
Hemoglobin: 10.3 g/dL — ABNORMAL LOW (ref 12.0–15.0)
Immature Granulocytes: 1 %
Lymphocytes Relative: 33 %
Lymphs Abs: 3.2 10*3/uL (ref 0.7–4.0)
MCH: 29.9 pg (ref 26.0–34.0)
MCHC: 32.3 g/dL (ref 30.0–36.0)
MCV: 92.7 fL (ref 80.0–100.0)
Monocytes Absolute: 0.9 10*3/uL (ref 0.1–1.0)
Monocytes Relative: 9 %
Neutro Abs: 5 10*3/uL (ref 1.7–7.7)
Neutrophils Relative %: 52 %
Platelets: 465 10*3/uL — ABNORMAL HIGH (ref 150–400)
RBC: 3.44 MIL/uL — ABNORMAL LOW (ref 3.87–5.11)
RDW: 15.1 % (ref 11.5–15.5)
WBC: 9.7 10*3/uL (ref 4.0–10.5)
nRBC: 0 % (ref 0.0–0.2)

## 2020-04-29 LAB — URINALYSIS, ROUTINE W REFLEX MICROSCOPIC
Bilirubin Urine: NEGATIVE
Glucose, UA: NEGATIVE mg/dL
Hgb urine dipstick: NEGATIVE
Ketones, ur: NEGATIVE mg/dL
Leukocytes,Ua: NEGATIVE
Nitrite: NEGATIVE
Protein, ur: NEGATIVE mg/dL
Specific Gravity, Urine: 1.003 — ABNORMAL LOW (ref 1.005–1.030)
pH: 7 (ref 5.0–8.0)

## 2020-04-29 LAB — PROTIME-INR
INR: 1.1 (ref 0.8–1.2)
Prothrombin Time: 13.3 seconds (ref 11.4–15.2)

## 2020-04-29 LAB — APTT: aPTT: 31 seconds (ref 24–36)

## 2020-04-29 LAB — SARS CORONAVIRUS 2 (TAT 6-24 HRS): SARS Coronavirus 2: NEGATIVE

## 2020-04-29 NOTE — Pre-Procedure Instructions (Signed)
Pre-Admit Testing Provider Notification Note  Provider Notified: Dr. Fransisca Connors  Notification Mode: Fax  Reason: Abnormal lab result.  Response: Fax confirmation received.   Additional Information: Placed on chart. Noted on Pre-Admit Worksheet.  Signed: Beulah Gandy, RN

## 2020-04-30 ENCOUNTER — Encounter: Payer: Self-pay | Admitting: Obstetrics and Gynecology

## 2020-04-30 ENCOUNTER — Other Ambulatory Visit: Payer: Self-pay

## 2020-04-30 ENCOUNTER — Encounter
Admission: RE | Disposition: A | Discharge status: Home / Self Care | Payer: Self-pay | Source: Home / Self Care | Attending: Obstetrics and Gynecology

## 2020-04-30 ENCOUNTER — Ambulatory Visit
Admission: RE | Admit: 2020-04-30 | Discharge: 2020-04-30 | Disposition: A | Discharge status: Home / Self Care | Payer: PPO | Attending: Obstetrics and Gynecology | Admitting: Obstetrics and Gynecology

## 2020-04-30 ENCOUNTER — Ambulatory Visit: Payer: PPO

## 2020-04-30 ENCOUNTER — Ambulatory Visit: Payer: PPO | Admitting: Anesthesiology

## 2020-04-30 ENCOUNTER — Ambulatory Visit: Admit: 2020-04-30 | Payer: PPO | Admitting: Obstetrics and Gynecology

## 2020-04-30 DIAGNOSIS — I10 Essential (primary) hypertension: Secondary | ICD-10-CM | POA: Diagnosis not present

## 2020-04-30 DIAGNOSIS — D270 Benign neoplasm of right ovary: Secondary | ICD-10-CM | POA: Insufficient documentation

## 2020-04-30 DIAGNOSIS — K529 Noninfective gastroenteritis and colitis, unspecified: Secondary | ICD-10-CM | POA: Diagnosis not present

## 2020-04-30 DIAGNOSIS — N888 Other specified noninflammatory disorders of cervix uteri: Secondary | ICD-10-CM | POA: Insufficient documentation

## 2020-04-30 DIAGNOSIS — N839 Noninflammatory disorder of ovary, fallopian tube and broad ligament, unspecified: Secondary | ICD-10-CM | POA: Diagnosis not present

## 2020-04-30 DIAGNOSIS — Z7982 Long term (current) use of aspirin: Secondary | ICD-10-CM | POA: Insufficient documentation

## 2020-04-30 DIAGNOSIS — Z79899 Other long term (current) drug therapy: Secondary | ICD-10-CM | POA: Insufficient documentation

## 2020-04-30 DIAGNOSIS — D271 Benign neoplasm of left ovary: Secondary | ICD-10-CM | POA: Insufficient documentation

## 2020-04-30 DIAGNOSIS — D259 Leiomyoma of uterus, unspecified: Secondary | ICD-10-CM | POA: Insufficient documentation

## 2020-04-30 DIAGNOSIS — N800 Endometriosis of uterus: Secondary | ICD-10-CM | POA: Insufficient documentation

## 2020-04-30 DIAGNOSIS — N801 Endometriosis of ovary: Secondary | ICD-10-CM | POA: Insufficient documentation

## 2020-04-30 DIAGNOSIS — N83292 Other ovarian cyst, left side: Secondary | ICD-10-CM | POA: Insufficient documentation

## 2020-04-30 DIAGNOSIS — N838 Other noninflammatory disorders of ovary, fallopian tube and broad ligament: Secondary | ICD-10-CM | POA: Diagnosis not present

## 2020-04-30 DIAGNOSIS — D649 Anemia, unspecified: Secondary | ICD-10-CM | POA: Insufficient documentation

## 2020-04-30 HISTORY — PX: TOTAL LAPAROSCOPIC HYSTERECTOMY WITH BILATERAL SALPINGO OOPHORECTOMY: SHX6845

## 2020-04-30 LAB — ABO/RH: ABO/RH(D): A POS

## 2020-04-30 SURGERY — HYSTERECTOMY, TOTAL, LAPAROSCOPIC, WITH BILATERAL SALPINGO-OOPHORECTOMY
Anesthesia: General

## 2020-04-30 SURGERY — HYSTERECTOMY, TOTAL, LAPAROSCOPIC
Anesthesia: Choice

## 2020-04-30 MED ORDER — ACETAMINOPHEN 10 MG/ML IV SOLN
INTRAVENOUS | Status: DC | PRN
  Administered 2020-04-30: 1000 mg via INTRAVENOUS

## 2020-04-30 MED ORDER — ONDANSETRON HCL 4 MG/2ML IJ SOLN
INTRAMUSCULAR | Status: DC | PRN
  Administered 2020-04-30: 4 mg via INTRAVENOUS

## 2020-04-30 MED ORDER — ACETAMINOPHEN NICU IV SYRINGE 10 MG/ML
INTRAVENOUS | Status: AC
  Filled 2020-04-30: qty 1

## 2020-04-30 MED ORDER — ROCURONIUM BROMIDE 100 MG/10ML IV SOLN
INTRAVENOUS | Status: DC | PRN
  Administered 2020-04-30: 20 mg via INTRAVENOUS
  Administered 2020-04-30: 60 mg via INTRAVENOUS

## 2020-04-30 MED ORDER — ROCURONIUM BROMIDE 10 MG/ML (PF) SYRINGE
PREFILLED_SYRINGE | INTRAVENOUS | Status: AC
  Filled 2020-04-30: qty 20

## 2020-04-30 MED ORDER — OXYCODONE HCL 5 MG PO TABS: 5.0000 mg | ORAL_TABLET | ORAL | 0 refills | Status: DC | PRN

## 2020-04-30 MED ORDER — FLEET ENEMA 7-19 GM/118ML RE ENEM: 1.0000 | ENEMA | Freq: Once | RECTAL | Status: DC

## 2020-04-30 MED ORDER — CEFAZOLIN SODIUM-DEXTROSE 2-4 GM/100ML-% IV SOLN
2.0000 g | INTRAVENOUS | Status: AC
  Administered 2020-04-30: 2 g via INTRAVENOUS

## 2020-04-30 MED ORDER — PHENYLEPHRINE HCL (PRESSORS) 10 MG/ML IV SOLN
INTRAVENOUS | Status: AC
  Filled 2020-04-30: qty 1

## 2020-04-30 MED ORDER — ARTIFICIAL TEARS OPHTHALMIC OINT
TOPICAL_OINTMENT | OPHTHALMIC | Status: AC
  Filled 2020-04-30: qty 3.5

## 2020-04-30 MED ORDER — GABAPENTIN 300 MG PO CAPS: 300.0000 mg | ORAL_CAPSULE | ORAL | Status: AC

## 2020-04-30 MED ORDER — DEXAMETHASONE SODIUM PHOSPHATE 10 MG/ML IJ SOLN
INTRAMUSCULAR | Status: DC | PRN
  Administered 2020-04-30: 10 mg via INTRAVENOUS

## 2020-04-30 MED ORDER — HEPARIN SODIUM (PORCINE) 5000 UNIT/ML IJ SOLN
INTRAMUSCULAR | Status: AC
  Administered 2020-04-30: 5000 [IU] via SUBCUTANEOUS
  Filled 2020-04-30: qty 1

## 2020-04-30 MED ORDER — PROPOFOL 10 MG/ML IV BOLUS
INTRAVENOUS | Status: DC | PRN
  Administered 2020-04-30: 150 mg via INTRAVENOUS

## 2020-04-30 MED ORDER — BUPIVACAINE HCL (PF) 0.5 % IJ SOLN
INTRAMUSCULAR | Status: AC
  Filled 2020-04-30: qty 30

## 2020-04-30 MED ORDER — PHENYLEPHRINE HCL (PRESSORS) 10 MG/ML IV SOLN
INTRAVENOUS | Status: DC | PRN
  Administered 2020-04-30 (×2): 100 ug via INTRAVENOUS

## 2020-04-30 MED ORDER — FENTANYL CITRATE (PF) 100 MCG/2ML IJ SOLN
INTRAMUSCULAR | Status: DC | PRN
  Administered 2020-04-30: 100 ug via INTRAVENOUS

## 2020-04-30 MED ORDER — ONDANSETRON HCL 4 MG/2ML IJ SOLN: 4.0000 mg | Freq: Once | INTRAMUSCULAR | Status: DC | PRN

## 2020-04-30 MED ORDER — SUGAMMADEX SODIUM 200 MG/2ML IV SOLN
INTRAVENOUS | Status: DC | PRN
  Administered 2020-04-30: 200 mg via INTRAVENOUS

## 2020-04-30 MED ORDER — LACTATED RINGERS IV SOLN: INTRAVENOUS | Status: DC

## 2020-04-30 MED ORDER — LIDOCAINE HCL (PF) 2 % IJ SOLN
INTRAMUSCULAR | Status: AC
  Filled 2020-04-30: qty 5

## 2020-04-30 MED ORDER — FENTANYL CITRATE (PF) 100 MCG/2ML IJ SOLN
INTRAMUSCULAR | Status: AC
  Administered 2020-04-30: 11:00:00 25 ug via INTRAVENOUS
  Filled 2020-04-30: qty 2

## 2020-04-30 MED ORDER — FENTANYL CITRATE (PF) 100 MCG/2ML IJ SOLN
INTRAMUSCULAR | Status: AC
  Filled 2020-04-30: qty 2

## 2020-04-30 MED ORDER — SUGAMMADEX SODIUM 500 MG/5ML IV SOLN
INTRAVENOUS | Status: AC
  Filled 2020-04-30: qty 5

## 2020-04-30 MED ORDER — PROPOFOL 500 MG/50ML IV EMUL
INTRAVENOUS | Status: AC
  Filled 2020-04-30: qty 50

## 2020-04-30 MED ORDER — CHLORHEXIDINE GLUCONATE CLOTH 2 % EX PADS: 6.0000 | MEDICATED_PAD | Freq: Once | CUTANEOUS | Status: DC

## 2020-04-30 MED ORDER — LIDOCAINE HCL (CARDIAC) PF 100 MG/5ML IV SOSY
PREFILLED_SYRINGE | INTRAVENOUS | Status: DC | PRN
  Administered 2020-04-30: 100 mg via INTRAVENOUS

## 2020-04-30 MED ORDER — BUPIVACAINE HCL (PF) 0.5 % IJ SOLN
INTRAMUSCULAR | Status: DC | PRN
  Administered 2020-04-30: 18 mL

## 2020-04-30 MED ORDER — PROPOFOL 500 MG/50ML IV EMUL
INTRAVENOUS | Status: DC | PRN
  Administered 2020-04-30: 120 ug/kg/min via INTRAVENOUS

## 2020-04-30 MED ORDER — FAMOTIDINE 20 MG PO TABS
20.0000 mg | ORAL_TABLET | Freq: Once | ORAL | Status: AC
  Administered 2020-04-30: 20 mg via ORAL

## 2020-04-30 MED ORDER — ONDANSETRON HCL 4 MG/2ML IJ SOLN
INTRAMUSCULAR | Status: AC
  Filled 2020-04-30: qty 2

## 2020-04-30 MED ORDER — MIDAZOLAM HCL 2 MG/2ML IJ SOLN
INTRAMUSCULAR | Status: DC | PRN
  Administered 2020-04-30: 2 mg via INTRAVENOUS

## 2020-04-30 MED ORDER — DOCUSATE SODIUM 100 MG PO CAPS
100.0000 mg | ORAL_CAPSULE | Freq: Two times a day (BID) | ORAL | 0 refills | Status: DC
Start: 2020-04-30 — End: 2020-08-11

## 2020-04-30 MED ORDER — KETOROLAC TROMETHAMINE 30 MG/ML IJ SOLN
INTRAMUSCULAR | Status: DC | PRN
  Administered 2020-04-30: 15 mg via INTRAVENOUS

## 2020-04-30 MED ORDER — GABAPENTIN 300 MG PO CAPS
ORAL_CAPSULE | ORAL | Status: AC
  Administered 2020-04-30: 300 mg via ORAL
  Filled 2020-04-30: qty 1

## 2020-04-30 MED ORDER — IBUPROFEN 800 MG PO TABS: 800.0000 mg | ORAL_TABLET | Freq: Three times a day (TID) | ORAL | 1 refills | Status: AC

## 2020-04-30 MED ORDER — EPHEDRINE 5 MG/ML INJ
INTRAVENOUS | Status: AC
  Filled 2020-04-30: qty 10

## 2020-04-30 MED ORDER — FAMOTIDINE 20 MG PO TABS
ORAL_TABLET | ORAL | Status: AC
  Filled 2020-04-30: qty 1

## 2020-04-30 MED ORDER — FENTANYL CITRATE (PF) 100 MCG/2ML IJ SOLN
25.0000 ug | INTRAMUSCULAR | Status: DC | PRN
  Administered 2020-04-30: 25 ug via INTRAVENOUS

## 2020-04-30 MED ORDER — HEPARIN SODIUM (PORCINE) 5000 UNIT/ML IJ SOLN: 5000.0000 [IU] | Freq: Once | INTRAMUSCULAR | Status: AC

## 2020-04-30 MED ORDER — PROPOFOL 10 MG/ML IV BOLUS
INTRAVENOUS | Status: AC
  Filled 2020-04-30: qty 20

## 2020-04-30 MED ORDER — SUCCINYLCHOLINE CHLORIDE 200 MG/10ML IV SOSY
PREFILLED_SYRINGE | INTRAVENOUS | Status: AC
  Filled 2020-04-30: qty 10

## 2020-04-30 MED ORDER — ACETAMINOPHEN 500 MG PO TABS
1000.0000 mg | ORAL_TABLET | Freq: Four times a day (QID) | ORAL | 0 refills | Status: AC
Start: 2020-04-30 — End: 2020-05-03

## 2020-04-30 MED ORDER — LACTATED RINGERS IV SOLN: INTRAVENOUS | Status: DC | PRN

## 2020-04-30 MED ORDER — DEXAMETHASONE SODIUM PHOSPHATE 10 MG/ML IJ SOLN
INTRAMUSCULAR | Status: AC
  Filled 2020-04-30: qty 1

## 2020-04-30 MED ORDER — MIDAZOLAM HCL 2 MG/2ML IJ SOLN
INTRAMUSCULAR | Status: AC
  Filled 2020-04-30: qty 2

## 2020-04-30 MED ORDER — CEFAZOLIN SODIUM-DEXTROSE 2-4 GM/100ML-% IV SOLN
INTRAVENOUS | Status: AC
  Filled 2020-04-30: qty 100

## 2020-04-30 SURGICAL SUPPLY — 67 items
APPLICATOR SURGIFLO ENDO (HEMOSTASIS) IMPLANT
BAG URINE DRAIN 2000ML AR STRL (UROLOGICAL SUPPLIES) ×3 IMPLANT
BLADE SURG 15 STRL LF DISP TIS (BLADE) ×1 IMPLANT
BLADE SURG 15 STRL SS (BLADE) ×2
CANISTER SUCT 1200ML W/VALVE (MISCELLANEOUS) ×3 IMPLANT
CANNULA DILATOR  5MM W/SLV (CANNULA) ×1
CANNULA DILATOR 5 W/SLV (CANNULA) ×2 IMPLANT
CATH FOLEY 2WAY  5CC 16FR (CATHETERS) ×2
CATH URTH 16FR FL 2W BLN LF (CATHETERS) ×1 IMPLANT
CHLORAPREP W/TINT 26 (MISCELLANEOUS) ×3 IMPLANT
CORD MONOPOLAR M/FML 12FT (MISCELLANEOUS) ×3 IMPLANT
COUNTER NEEDLE 20/40 LG (NEEDLE) ×2 IMPLANT
COVER WAND RF STERILE (DRAPES) ×3 IMPLANT
DEFOGGER SCOPE WARMER CLEARIFY (MISCELLANEOUS) ×3 IMPLANT
DERMABOND ADVANCED (GAUZE/BANDAGES/DRESSINGS) ×2
DERMABOND ADVANCED .7 DNX12 (GAUZE/BANDAGES/DRESSINGS) ×1 IMPLANT
DEVICE SUTURE ENDOST 10MM (ENDOMECHANICALS) ×2 IMPLANT
DRAPE STERI POUCH LG 24X46 STR (DRAPES) ×4 IMPLANT
GLOVE BIO SURGEON STRL SZ8 (GLOVE) ×12 IMPLANT
GLOVE INDICATOR 8.0 STRL GRN (GLOVE) ×3 IMPLANT
GOWN STRL REUS W/ TWL LRG LVL3 (GOWN DISPOSABLE) ×2 IMPLANT
GOWN STRL REUS W/ TWL XL LVL3 (GOWN DISPOSABLE) ×1 IMPLANT
GOWN STRL REUS W/TWL LRG LVL3 (GOWN DISPOSABLE) ×4
GOWN STRL REUS W/TWL XL LVL3 (GOWN DISPOSABLE) ×2
GRASPER SUT TROCAR 14GX15 (MISCELLANEOUS) ×3 IMPLANT
IRRIGATION STRYKERFLOW (MISCELLANEOUS) IMPLANT
IRRIGATOR STRYKERFLOW (MISCELLANEOUS)
IV LACTATED RINGERS 1000ML (IV SOLUTION) IMPLANT
KIT IMAGING PINPOINTPAQ (MISCELLANEOUS) IMPLANT
KIT PINK PAD W/HEAD ARE REST (MISCELLANEOUS) ×3
KIT PINK PAD W/HEAD ARM REST (MISCELLANEOUS) ×1 IMPLANT
LABEL OR SOLS (LABEL) ×3 IMPLANT
LIGASURE VESSEL 5MM BLUNT TIP (ELECTROSURGICAL) ×2 IMPLANT
MANIPULATOR UTERINE 4.5 ZUMI (MISCELLANEOUS) IMPLANT
MANIPULATOR VCARE LG CRV RETR (MISCELLANEOUS) IMPLANT
MANIPULATOR VCARE SML CRV RETR (MISCELLANEOUS) IMPLANT
MANIPULATOR VCARE STD CRV RETR (MISCELLANEOUS) ×2 IMPLANT
NDL INSUFF ACCESS 14 VERSASTEP (NEEDLE) ×3 IMPLANT
NDL SPNL 22GX5 LNG QUINC BK (NEEDLE) ×1 IMPLANT
NEEDLE SPNL 22GX5 LNG QUINC BK (NEEDLE) ×3 IMPLANT
NS IRRIG 500ML POUR BTL (IV SOLUTION) ×3 IMPLANT
OCCLUDER COLPOPNEUMO (BALLOONS) ×3 IMPLANT
PACK GYN LAPAROSCOPIC (MISCELLANEOUS) ×3 IMPLANT
PAD OB MATERNITY 4.3X12.25 (PERSONAL CARE ITEMS) ×3 IMPLANT
PAD PREP 24X41 OB/GYN DISP (PERSONAL CARE ITEMS) ×3 IMPLANT
PORT ACCESS TROCAR AIRSEAL 5 (TROCAR) ×3 IMPLANT
POUCH ENDO CATCH II 15MM (MISCELLANEOUS) IMPLANT
SET CYSTO W/LG BORE CLAMP LF (SET/KITS/TRAYS/PACK) ×3 IMPLANT
SET TRI-LUMEN FLTR TB AIRSEAL (TUBING) ×3 IMPLANT
SHEARS ENDO 5MM 31CM (CUTTER) ×2 IMPLANT
SPOGE SURGIFLO 8M (HEMOSTASIS)
SPONGE LAP 4X18 RFD (DISPOSABLE) ×2 IMPLANT
SPONGE SURGIFLO 8M (HEMOSTASIS) IMPLANT
SUT ENDO VLOC 180-0-8IN (SUTURE) ×2 IMPLANT
SUT MNCRL 4-0 (SUTURE) ×6
SUT MNCRL 4-0 27XMFL (SUTURE) ×3
SUT VIC AB 0 CT1 36 (SUTURE) ×3 IMPLANT
SUT VIC AB 2-0 UR6 27 (SUTURE) ×3 IMPLANT
SUT VICRYL 0 AB UR-6 (SUTURE) ×2 IMPLANT
SUTURE MNCRL 4-0 27XMF (SUTURE) ×2 IMPLANT
SYR 10ML LL (SYRINGE) ×3 IMPLANT
SYR 20ML LL LF (SYRINGE) ×2 IMPLANT
SYR 3ML LL SCALE MARK (SYRINGE) ×6 IMPLANT
SYR 50ML LL SCALE MARK (SYRINGE) ×6 IMPLANT
TROCAR BLUNT TIP 12MM OMST12BT (TROCAR) ×3 IMPLANT
TROCAR VERSASTEP PLUS 12MM (TROCAR) ×3 IMPLANT
TROCAR VERSASTEP PLUS 5MM (TROCAR) ×3 IMPLANT

## 2020-04-30 NOTE — Interval H&P Note (Signed)
History and Physical Interval Note:  04/30/2020 7:06 AM  Christy Summers  has presented today for surgery, with the diagnosis of ovarian mass.  The various methods of treatment have been discussed with the patient and family. After consideration of risks, benefits and other options for treatment, the patient has consented to  Procedure(s): HYSTERECTOMY TOTAL LAPAROSCOPIC/BILATERAL SALPINGO OOPHORECTOMY POSSIBLE PELVIC/AORTIC LYMPH NODE DISSECTION, OMENTECTOMY, LAPAROTOMY (N/A) as a surgical intervention.  The patient's history has been reviewed, patient examined, no change in status, stable for surgery.  I have reviewed the patient's chart and labs.  Questions were answered to the patient's satisfaction.     Mellody Drown

## 2020-04-30 NOTE — Anesthesia Procedure Notes (Addendum)
Procedure Name: Intubation Date/Time: 04/30/2020 7:41 AM Performed by: Justus Memory, CRNA Pre-anesthesia Checklist: Patient identified, Patient being monitored, Timeout performed, Emergency Drugs available and Suction available Patient Re-evaluated:Patient Re-evaluated prior to induction Oxygen Delivery Method: Circle system utilized Preoxygenation: Pre-oxygenation with 100% oxygen Induction Type: IV induction Ventilation: Mask ventilation without difficulty Laryngoscope Size: 3 and McGraph Grade View: Grade I Tube type: Oral Tube size: 7.0 mm Number of attempts: 1 Airway Equipment and Method: Stylet and Video-laryngoscopy Placement Confirmation: ETT inserted through vocal cords under direct vision,  positive ETCO2 and breath sounds checked- equal and bilateral Secured at: 21 cm Tube secured with: Tape Dental Injury: Teeth and Oropharynx as per pre-operative assessment

## 2020-04-30 NOTE — Interval H&P Note (Signed)
History and Physical Interval Note:  04/30/2020 7:15 AM  Christy Summers  has presented today for surgery, with the diagnosis of ovarian mass.  The various methods of treatment have been discussed with the patient and family. After consideration of risks, benefits and other options for treatment, the patient has consented to  Procedure(s): HYSTERECTOMY TOTAL LAPAROSCOPIC/BILATERAL SALPINGO OOPHORECTOMY POSSIBLE PELVIC/AORTIC LYMPH NODE DISSECTION, OMENTECTOMY, LAPAROTOMY (N/A) as a surgical intervention.  The patient's history has been reviewed, patient examined, no change in status, stable for surgery.  I have reviewed the patient's chart and labs.  Questions were answered to the patient's satisfaction.    Discussed that if removal of uterus would be difficult for technical reasons it could be left in place, if ovarian mass benign and no other concerning pathology. Also will T and C for 2 units of blood in view of her anemia and potential need for surgical staging.      Mellody Drown

## 2020-04-30 NOTE — Op Note (Addendum)
Operative Note   04/30/2020 11:37 AM  PRE-OP DIAGNOSIS: right ovarian mass    POST-OP DIAGNOSIS: mucinous cystadenoma right ovary  SURGEON: Surgeon(s) and Role:    Mellody Drown, MD - Primary     ASSISTANT:      Benjaman Kindler, MD  ANESTHESIA: General   PROCEDURE: Procedure(s): HYSTERECTOMY TOTAL LAPAROSCOPIC/BILATERAL SALPINGO OOPHORECTOMY  ESTIMATED BLOOD LOSS: less than 50 mL  DRAINS: NONE   TOTAL IV FLUIDS: per anesthesia  SPECIMENS: Uterus, tubes and ovaries  COMPLICATIONS: none  DISPOSITION: PACU - hemodynamically stable.  CONDITION: stable  INDICATIONS: right ovarian mass  FINDINGS: The right ovary had a 6 cm smooth mobile cystic mass.  Uterus with a few small fibroids, and left adnexa normal.  Abdominal survey normal. Frozen section of the right ovary showed benign mucinous tumor.   PROCEDURE IN DETAIL: After informed consent was obtained, the patient was taken to the operating room where anesthesia was obtained without difficulty. The patient was positioned in the dorsal lithotomy position in [place] and her arms were carefully tucked at her sides and the usual precautions were taken.  She was prepped and draped in normal sterile fashion.  Time-out was performed and a Foley catheter was placed into the bladder and a standard VCare uterine manipulator was then placed in the uterus without incident.    An open Hasson technique was used to place an infraumbilical [id number] baloon trocar under direct visualization. The laparoscope was introduced and CO2 gas was infused for pneumoperitoneum to a pressure of 15 mm Hg.  Right and left lateral 5-mm ports (Airseal on the left) and a 5-12 mm suprapubic port were placed under direct visualization of the laparoscope using an EndoStep technique.  Cytologic washings were obtained.  The patient was placed in Trendelenburg and the bowel was displaced up into the upper abdomen.  Round ligaments were divided on each side  with the EndoShears and the retroperitoneal space was opened bilaterally.  The ureters were identified and preserved.  At this point the retroperitoneal spaces were developed, the infundibulopelvic ligaments were skeletonized, sealed and divided with the LigaSure device after identifying the ureters.  A bladder flap was created and the bladder was dissected down off the lower uterine segment and cervix using endoshears and electrocautery.  The uterine arteries were skeletonized bilaterally, sealed and divided with the LigaSure device.  A colpotomy was performed circumferentially along the V-Care ring with electrocautery and the cervix was incised from the vagina and the specimen was removed through the vagina.  A pneumo balloon was placed in the vagina and the vaginal cuff was then closed in a running continuous fashion using the EndoStitch technique with 0 V-Lock suture with careful attention to include the vaginal cuff angles and the vaginal mucosa within the closure.  Intraoperative pathologic evaluation revealed favorable risk criteria and therefore further dissection was not performed.  Hemostasis was observed. The intraperitoneal pressure was dropped, and all planes of dissection, vascular pedicles and the vaginal cuff were found to be hemostatic.  The suprapubic trocar was removed and the fascia was closed with 0 Vicryl suture using the Endoclose technique. The lateral trocars were removed under visualization.   The left Airseal port was also closed with the Endoclose.  Before the umbilical trocar was removed the CO2 gas was released.  The fascia there was closed with 0 Vicryl suture in interrupted figure of eight technique.   The skin incisions were closed with subcuticular stitches and glue. The patient tolerated the  procedure well.  Sponge, lap and needle counts were correct x2.  The patient was taken to recovery room in excellent condition.  Antibiotics: Ancef 2 gm   VTE prophylaxis: ordered  perioperatively with boots and heparin.   Mellody Drown, MD

## 2020-04-30 NOTE — Discharge Instructions (Signed)
Discharge instructions after   total laparoscopic hysterectomy   For the next three days, take ibuprofen and acetaminophen on a schedule, every 8 hours. You can take them together or you can intersperse them, and take one every four hours. I also gave you gabapentin for nighttime, to help you sleep and also to control pain. Take gabapentin medicines at night for at least the next 3 nights. You also have a narcotic, oxycodone, to take as needed if the above medicines don't help.  Postop constipation is a major cause of pain. Stay well hydrated, walk as you tolerate, and take over the counter senna as well as stool softeners if you need them.    Signs and Symptoms to Report Call our office at [phone] if you have any of the following.  . Fever over 100.4 degrees or higher . Severe stomach pain not relieved with pain medications . Bright red bleeding that's heavier than a period that does not slow with rest . To go the bathroom a lot (frequency), you can't hold your urine (urgency), or it hurts when you empty your bladder (urinate) . Chest pain . Shortness of breath . Pain in the calves of your legs . Severe nausea and vomiting not relieved with anti-nausea medications . Signs of infection around your wounds, such as redness, hot to touch, swelling, green/yellow drainage (like pus), bad smelling discharge . Any concerns  What You Can Expect after Surgery . You may see some pink tinged, bloody fluid and bruising around the wound. This is normal. . You may notice shoulder and neck pain. This is caused by the gas used during surgery to expand your abdomen so your surgeon could get to the uterus easier. . You may have a sore throat because of the tube in your mouth during general anesthesia. This will go away in 2 to 3 days. . You may have some stomach cramps. . You may notice spotting on your panties. . You may have pain around the incision sites.   Activities after Your  Discharge Follow these guidelines to help speed your recovery at home: . Do the coughing and deep breathing as you did in the hospital for 2 weeks. Use the small blue breathing device, called the incentive spirometer for 2 weeks. . Don't drive if you are in pain or taking narcotic pain medicine. You may drive when you can safely slam on the brakes, turn the wheel forcefully, and rotate your torso comfortably. This is typically 1-2 weeks. Practice in a parking lot or side street prior to attempting to drive regularly.  . Ask others to help with household chores for 4 weeks. . Do not lift anything heavier that 10 pounds for 4-6 weeks. This includes pets, children, and groceries. . Don't do strenuous activities, exercises, or sports like vacuuming, tennis, squash, etc. until your doctor says it is safe to do so. ---Maintain pelvic rest for 8 weeks. This means nothing in the vagina or rectum at all (no douching, tampons, intercourse) for 8 weeks.  . Walk as you feel able. Rest often since it may take two or three weeks for your energy level to return to normal.  . You may climb stairs . Avoid constipation:   -Eat fruits, vegetables, and whole grains. Eat small meals as your appetite will take time to return to normal.   -Drink 6 to 8 glasses of water each day unless your doctor has told you to limit your fluids.   -Use a laxative   or stool softener as needed if constipation becomes a problem. You may take Miralax, metamucil, Citrucil, Colace, Senekot, FiberCon, etc. If this does not relieve the constipation, try two tablespoons of Milk Of Magnesia every 8 hours until your bowels move.  . You may shower. Gently wash the wounds with a mild soap and water. Pat dry. . Do not get in a hot tub, swimming pool, etc. for 6 weeks. . Do not use lotions, oils, powders on the wounds. . Do not douche, use tampons, or have sex until your doctor says it is okay. . Take your pain medicine when you need it. The medicine  may not work as well if the pain is bad.  Take the medicines you were taking before surgery. Other medications you will need are pain medications and possibly constipation and nausea medications (Zofran).    AMBULATORY SURGERY  DISCHARGE INSTRUCTIONS   1) The drugs that you were given will stay in your system until tomorrow so for the next 24 hours you should not:  A) Drive an automobile B) Make any legal decisions C) Drink any alcoholic beverage   2) You may resume regular meals tomorrow.  Today it is better to start with liquids and gradually work up to solid foods.  You may eat anything you prefer, but it is better to start with liquids, then soup and crackers, and gradually work up to solid foods.   3) Please notify your doctor immediately if you have any unusual bleeding, trouble breathing, redness and pain at the surgery site, drainage, fever, or pain not relieved by medication.    4) Additional Instructions:        Please contact your physician with any problems or Same Day Surgery at [phone], Monday through Friday 6 am to 4 pm, or [place] [facility] number at [phone]. 

## 2020-04-30 NOTE — Anesthesia Preprocedure Evaluation (Signed)
Anesthesia Evaluation  Patient identified by MRN, date of birth, ID band Patient awake    Reviewed: Allergy & Precautions, NPO status , Patient's Chart, lab work & pertinent test results  History of Anesthesia Complications Negative for: history of anesthetic complications  Airway Mallampati: II  TM Distance: >3 FB Neck ROM: Full    Dental  (+) Upper Dentures, Partial Lower, Dental Advisory Given   Pulmonary neg pulmonary ROS, neg sleep apnea, neg COPD, Patient abstained from smoking.Not current smoker,    Pulmonary exam normal breath sounds clear to auscultation       Cardiovascular Exercise Tolerance: Good METShypertension, (-) CAD and (-) Past MI (-) dysrhythmias  Rhythm:Regular Rate:Normal - Systolic murmurs    Neuro/Psych negative neurological ROS  negative psych ROS   GI/Hepatic neg GERD  ,(+)     (-) substance abuse  ,   Endo/Other  neg diabetes  Renal/GU negative Renal ROS     Musculoskeletal   Abdominal (+) + obese,   Peds  Hematology  (+) anemia ,   Anesthesia Other Findings Past Medical History: No date: Anemia     Comment:  normocytic No date: Bilateral ovarian cysts     Comment:  only right ovarian cyst No date: Chronic diarrhea No date: Elevated lipids 04/2020: History of Clostridioides difficile colitis     Comment:  pt. has been positive and negative since 2017. most               recent result was negative. diarrhea persists No date: Hypertension  Reproductive/Obstetrics                             Anesthesia Physical Anesthesia Plan  ASA: II  Anesthesia Plan: General   Post-op Pain Management:    Induction: Intravenous  PONV Risk Score and Plan: 4 or greater and Ondansetron, Dexamethasone, Propofol infusion and TIVA  Airway Management Planned: Oral ETT  Additional Equipment: None  Intra-op Plan:   Post-operative Plan: Extubation in OR  Informed  Consent: I have reviewed the patients History and Physical, chart, labs and discussed the procedure including the risks, benefits and alternatives for the proposed anesthesia with the patient or authorized representative who has indicated his/her understanding and acceptance.     Dental advisory given  Plan Discussed with: CRNA and Surgeon  Anesthesia Plan Comments: (Discussed risks of anesthesia with patient, including PONV, sore throat, lip/dental damage. Rare risks discussed as well, such as cardiorespiratory and neurological sequelae. Patient understands.)        Anesthesia Quick Evaluation

## 2020-04-30 NOTE — Transfer of Care (Signed)
Immediate Anesthesia Transfer of Care Note  Patient: Christy Summers  Procedure(s) Performed: HYSTERECTOMY TOTAL LAPAROSCOPIC/BILATERAL SALPINGO OOPHORECTOMY POSSIBLE PELVIC/AORTIC LYMPH NODE DISSECTION, OMENTECTOMY, LAPAROTOMY (N/A )  Patient Location: PACU  Anesthesia Type:General  Level of Consciousness: sedated  Airway & Oxygen Therapy: Patient Spontanous Breathing and Patient connected to face mask oxygen  Post-op Assessment: Report given to RN and Post -op Vital signs reviewed and stable  Post vital signs: Reviewed and stable  Last Vitals:  Vitals Value Taken Time  BP 129/61 04/30/20 1027  Temp 36.1 C 04/30/20 1021  Pulse 75 04/30/20 1030  Resp 27 04/30/20 1030  SpO2 99 % 04/30/20 1030  Vitals shown include unvalidated device data.  Last Pain:  Vitals:   04/30/20 1021  TempSrc:   PainSc: Asleep         Complications: No apparent anesthesia complications

## 2020-05-01 NOTE — Anesthesia Postprocedure Evaluation (Signed)
Anesthesia Post Note  Patient: Christy Summers  Procedure(s) Performed: HYSTERECTOMY TOTAL LAPAROSCOPIC/BILATERAL SALPINGO OOPHORECTOMY POSSIBLE PELVIC/AORTIC LYMPH NODE DISSECTION, OMENTECTOMY, LAPAROTOMY (N/A )  Patient location during evaluation: PACU Anesthesia Type: General Level of consciousness: awake and alert Pain management: pain level controlled Vital Signs Assessment: post-procedure vital signs reviewed and stable Respiratory status: spontaneous breathing, nonlabored ventilation, respiratory function stable and patient connected to nasal cannula oxygen Cardiovascular status: blood pressure returned to baseline and stable Postop Assessment: no apparent nausea or vomiting Anesthetic complications: no     Last Vitals:  Vitals:   04/30/20 1236 04/30/20 1318  BP: 126/66 129/63  Pulse: 79 80  Resp: 16 18  Temp:  36.5 C  SpO2: 99% 97%    Last Pain:  Vitals:   04/30/20 1318  TempSrc: Temporal  PainSc: 0-No pain                 Martha Clan

## 2020-05-02 LAB — BPAM RBC
Blood Product Expiration Date: 202105252359
Blood Product Expiration Date: 202105252359
Unit Type and Rh: 6200
Unit Type and Rh: 6200

## 2020-05-02 LAB — TYPE AND SCREEN
ABO/RH(D): A POS
Antibody Screen: NEGATIVE
Unit division: 0
Unit division: 0

## 2020-05-02 LAB — PREPARE RBC (CROSSMATCH)

## 2020-05-05 LAB — SURGICAL PATHOLOGY

## 2020-05-05 LAB — CYTOLOGY - NON PAP

## 2020-05-12 ENCOUNTER — Other Ambulatory Visit: Payer: Self-pay | Admitting: Internal Medicine

## 2020-05-12 DIAGNOSIS — Z1231 Encounter for screening mammogram for malignant neoplasm of breast: Secondary | ICD-10-CM

## 2020-05-28 ENCOUNTER — Other Ambulatory Visit: Payer: Self-pay

## 2020-05-28 ENCOUNTER — Inpatient Hospital Stay: Payer: PPO | Attending: Obstetrics and Gynecology | Admitting: Obstetrics and Gynecology

## 2020-05-28 VITALS — BP 133/68 | HR 94 | Temp 98.4°F | Resp 18 | Wt 214.6 lb

## 2020-05-28 DIAGNOSIS — Z90722 Acquired absence of ovaries, bilateral: Secondary | ICD-10-CM

## 2020-05-28 DIAGNOSIS — N838 Other noninflammatory disorders of ovary, fallopian tube and broad ligament: Secondary | ICD-10-CM

## 2020-05-28 DIAGNOSIS — Z90710 Acquired absence of both cervix and uterus: Secondary | ICD-10-CM | POA: Insufficient documentation

## 2020-05-28 DIAGNOSIS — D270 Benign neoplasm of right ovary: Secondary | ICD-10-CM | POA: Insufficient documentation

## 2020-05-28 NOTE — Progress Notes (Signed)
Gynecologic Oncology Consult Visit   Referring Provider: Dr Rogue Bussing  Chief Concern: post op  Subjective:  Christy Summers is a 69 y.o. P53 female who is seen in consultation from Dr. Rogue Bussing for ovarian cysts.  04/30/20 Underwent TLH/BSO for 6 cm right ovarian mass A. 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  - CERVIX WITH NABOTHIAN CYSTS.  - INACTIVE ENDOMETRIUM.  - MYOMETRIUM WITH EXTENSIVE ADENOMYOSIS AND FOCALLY CALCIFIED  LEIOMYOMATA, LARGEST MEASURING 2.0 CM.  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  - LEFT AND RIGHT FALLOPIAN TUBES WITH BENIGN PARATUBAL CYSTS.  - NEGATIVE FOR ATYPIA AND MALIGNANCY.   Recovering well from surgery.  No complaints.  History History of chronic anemia unclear etiology; and also chronic diarrhea and polyclonal gammopathy  #2018-Anemia hemoglobin 9-10/normocytic-second to chronic inflammation//C. 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 C.diff s/p Vanco PO  [Dr.Elliot; [place]]  Patient interim had a CT scan with her PCP noted to have an abnormal approximately 4-5 cm right adnexal mass concerning for malignancy.    Patient continues to have ongoing diarrhea.  However recent work-up for CVA was negative.   Mild to moderate fatigue.  Denies any pain.  She had CT scan 04/10/20 due to elevated alk phos and showed 4-5 cm cyst on right ovary. Previously had been 2.2 cm on CT scan in 6/18, unchanged in 3/19.   [place] 04/21/20 Uterus Measurements: 5.6 x 3.1 x 4.2 cm = volume: 39 mL. Retroverted. Calcified leiomyoma at upper uterus 14 x 9 mm, with posterior shadowing. No additional masses.Right ovary Measurements: 4.8 x 5.3 x 4.6 cm =  volume: 61 mL. Complicated cyst within RIGHT ovary 4.6 x 4.8 x 4.5 cm, containing multiple septations, area of mural nodularity, and scattered internal echoes. Some blood flow is seen within the soft tissue components. Ovarian neoplasm not excluded.  Left ovary: Not visualized, likely obscured by bowel No free pelvic fluid.  No additional pelvic masses.  IMPRESSION: Small calcified leiomyoma at upper uterus. Nonvisualization LEFT ovary. 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   CA125 done 4/30 = 4.1  She has no pelvic pain or other symptoms from the cyst.  Normal PAP a few years ago.  Problem List: Patient Active Problem List   Diagnosis Date Noted  . Ovarian mass, right 04/23/2020  . Normocytic anemia 12/29/2018    Past Medical History: Past Medical History:  Diagnosis Date  . Anemia    normocytic  . Bilateral ovarian cysts    only right ovarian cyst  . Chronic diarrhea   . Elevated lipids   . History of Clostridioides difficile colitis 04/2020   pt. has been positive and negative since 2017. most recent result was negative. diarrhea persists  . Hypertension     Past Surgical History: Past Surgical History:  Procedure Laterality Date  . CHOLECYSTECTOMY    . COLONOSCOPY WITH PROPOFOL N/A 09/07/2017   Procedure: COLONOSCOPY WITH PROPOFOL;  Surgeon: Toledo, Benay Pike, MD;  Location: [facility] ENDOSCOPY;  Service: Endoscopy;  Laterality: N/A;  . COLONOSCOPY WITH PROPOFOL N/A 01/22/2019   Procedure: COLONOSCOPY WITH PROPOFOL;  Surgeon: Manya Silvas, MD;  Location: [facility] ENDOSCOPY;  Service: Endoscopy;  Laterality: N/A;  . ESOPHAGOGASTRODUODENOSCOPY (EGD) WITH PROPOFOL N/A 09/07/2017   Procedure: ESOPHAGOGASTRODUODENOSCOPY (EGD) WITH PROPOFOL;  Surgeon: Toledo, Benay Pike,  MD;  Location: [facility] ENDOSCOPY;  Service: Endoscopy;  Laterality: N/A;  . TOTAL LAPAROSCOPIC HYSTERECTOMY WITH BILATERAL SALPINGO OOPHORECTOMY N/A 04/30/2020   Procedure: HYSTERECTOMY TOTAL LAPAROSCOPIC/BILATERAL SALPINGO OOPHORECTOMY POSSIBLE PELVIC/AORTIC LYMPH NODE DISSECTION, OMENTECTOMY, LAPAROTOMY;  Surgeon: Mellody Drown, MD;  Location: [facility] ORS;  Service: Gynecology;  Laterality: N/A;     OB History:  OB History  No obstetric history on file.    Family History: Family History  Problem Relation Age of Onset  . Hypertension Mother   . Stroke Mother   . Stroke Sister   . Hypertension Sister   . Hypertension Brother   . Breast cancer Neg Hx     Social History: Social History   Socioeconomic History  . Marital status: Divorced    Spouse name: Not on file  . Number of children: Not on file  . Years of education: Not on file  . Highest education level: Not on file  Occupational History  . Occupation: [employer]    Comment: retired  Tobacco Use  . Smoking status: Never Smoker  . Smokeless tobacco: Never Used  Substance and Sexual Activity  . Alcohol use: Not Currently    Comment: rare sweet drink  . Drug use: Not Currently  . Sexual activity: Not Currently  Other Topics Concern  . Not on file  Social History Narrative   She is retired from [employer].    Patient lives alone. Son to help out after surgery   Social Determinants of Health   Financial Resource Strain:   . Difficulty of Paying Living Expenses:   Food Insecurity:   . Worried About Charity fundraiser in the Last Year:   . Arboriculturist in the Last Year:   Transportation Needs:   . Film/video editor (Medical):   Marland Kitchen Lack of Transportation (Non-Medical):   Physical Activity:   . Days of Exercise per Week:   . Minutes of Exercise per Session:   Stress:   . Feeling of Stress :   Social Connections:   . Frequency of Communication with Friends and Family:   . Frequency of Social Gatherings with  Friends and Family:   . Attends Religious Services:   . Active Member of Clubs or Organizations:   . Attends Archivist Meetings:   Marland Kitchen Marital Status:   Intimate Partner Violence:   . Fear of Current or Ex-Partner:   . Emotionally Abused:   Marland Kitchen Physically Abused:   . Sexually Abused:     Allergies: Allergies  Allergen Reactions  . Mesalamine Diarrhea and Nausea Only    Current Medications: Current Outpatient Medications  Medication Sig Dispense Refill  . amLODipine (NORVASC) 10 MG tablet Take 10 mg by mouth daily.    . Ascorbic Acid (VITAMIN C) 500 MG CAPS Take 500 mg by mouth daily.     Marland Kitchen aspirin 81 MG EC tablet Take 81 mg by mouth daily.     . calcium elemental as carbonate (TUMS ULTRA 1000) 400 MG chewable tablet Chew 1,000 mg by mouth daily.    . Cholecalciferol (VITAMIN D-1000 MAX ST) 25 MCG (1000 UT) tablet Take 1,000 Units by mouth daily.     . cyanocobalamin 1000 MCG tablet Take 1,000 mcg by mouth daily.    Marland Kitchen loperamide (  IMODIUM A-D) 2 MG tablet Take 2 mg by mouth 4 (four) times daily as needed for diarrhea or loose stools.    Marland Kitchen losartan-hydrochlorothiazide (HYZAAR) 100-25 MG tablet Take 1 tablet by mouth daily.    . potassium chloride (K-DUR) 10 MEQ tablet Take 10 mEq by mouth 2 (two) times daily.     Marland Kitchen docusate sodium (COLACE) 100 MG capsule Take 1 capsule (100 mg total) by mouth 2 (two) times daily. To keep stools soft (Patient not taking: Reported on 05/28/2020) 30 capsule 0  . iron polysaccharides (NIFEREX) 150 MG capsule Take 150 mg by mouth daily.      No current facility-administered medications for this visit.    Review of Systems  per interval history  Objective:  Physical Examination:  BP 133/68 (BP Location: Right Arm, Patient Position: Sitting, Cuff Size: Large)   Pulse 94   Temp 98.4 F (36.9 C) (Oral)   Resp 18   Wt 214 lb 9.6 oz (97.3 kg)   SpO2 100%   BMI 34.64 kg/m    ECOG Performance Status: 0 - Asymptomatic  General appearance:  alert, cooperative and appears stated age HEENT:extra ocular movement intact and thyroid without masses Lymph node survey: non-palpable, axillary, inguinal, supraclavicular Cardiovascular: regular rate and rhythm, no murmurs or gallops Respiratory: normal air entry, lungs clear to auscultation and no rales, rhonchi or wheezing Breast exam: not examined. Abdomen: no hernias and well healed incisions Back: inspection of back is normal Extremities: extremities normal, atraumatic, no cyanosis or edema Skin exam - normal coloration and turgor, no rashes, no suspicious skin lesions noted. Neurological exam reveals alert, oriented, normal speech, no focal findings or movement disorder noted.  Pelvic: exam chaperoned by nurse;  Vulva: normal appearing vulva with no masses, tenderness or lesions; Vagina: normal vagina with cuff healing well; Bimanual normal.  Lab Review Lab Results  Component Value Date   WBC 9.7 04/29/2020   HGB 10.3 (L) 04/29/2020   HCT 31.9 (L) 04/29/2020   MCV 92.7 04/29/2020   PLT 465 (H) 04/29/2020     Chemistry      Component Value Date/Time   NA 134 (L) 04/29/2020 1021   K 3.6 04/29/2020 1021   CL 101 04/29/2020 1021   CO2 25 04/29/2020 1021   BUN 15 04/29/2020 1021   CREATININE 0.91 04/29/2020 1021      Component Value Date/Time   CALCIUM 9.2 04/29/2020 1021   ALKPHOS 213 (H) 04/29/2020 1021   AST 19 04/29/2020 1021   ALT 27 04/29/2020 1021   BILITOT 0.6 04/29/2020 1021          Assessment:  Christy Summers is a 69 y.o. 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 Normal post op exam today.    Medical co-morbidities complicating care: chronic normocytic anemia Hct 30%, history of C diff.  Plan:   Problem List Items Addressed This Visit      Other   Ovarian mass, right - Primary      In view of benign pathology she can follow up with Dr Rogue Bussing.  We can see her back should the need arise.  Discussed avoiding intercourse for another month, but  she is not presently sexually active.   Mellody Drown, MD  CC:  Tracie Harrier, MD [address] [place],  [place] [phone]

## 2020-05-28 NOTE — Progress Notes (Signed)
[place]    Patient Care Team: Tracie Harrier, MD as PCP - General (Internal Medicine) Mellody Drown, MD as Referring Physician (Obstetrics)   Name of the patient: Christy Summers  [id number]  November 27, 1951   Date of visit: 05/28/2020  Diagnosis: 1. Ovarian mass, right   Gynecologic Oncology Postoperative Visit   Referring Provider: Dr Rogue Bussing  Chief Concern: ovarian cysts  Subjective:  Christy Summers is a 69 y.o. P37 female who is seen in consultation from Dr. Rogue Bussing for ovarian cysts.  05/28/2020 She returns to the clinic today for a 6 week postoperative visit. She reports no new symptoms this visit. She reported "minimal soreness" after surgery four weeks ago.   She reports being very satisfied with the procedure, as well as relief of her symptoms.     History of chronic anemia unclear etiology; and also chronic diarrhea and polyclonal gammopathy  #2018-Anemia hemoglobin 9-10/normocytic-second to chronic inflammation//C. 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 C.diff s/p Vanco PO  [Dr.Elliot; [facility]]  Patient interim had a CT scan with her PCP noted to have an abnormal approximately 4-5 cm right adnexal mass concerning for malignancy.    Patient continues to have ongoing diarrhea.  However recent work-up for CVA was negative.   Mild to moderate fatigue.  Denies any pain.  She had CT scan 04/10/20 due to elevated alk phos and showed 4-5 cm cyst on right ovary. Previously had been 2.2 cm on CT scan in 6/18, unchanged in 3/19.   [place] 04/21/20 Uterus Measurements: 5.6 x 3.1 x 4.2 cm = volume: 39 mL. Retroverted. Calcified leiomyoma at upper uterus 14 x 9 mm, with posterior shadowing. No additional masses.Right ovary Measurements: 4.8 x 5.3 x 4.6 cm = volume: 61 mL. Complicated cyst within RIGHT ovary 4.6 x 4.8 x 4.5 cm, containing multiple septations, area of mural  nodularity, and scattered internal echoes. Some blood flow is seen within the soft tissue components. Ovarian neoplasm not excluded.  Left ovary: Not visualized, likely obscured by bowel No free pelvic fluid.  No additional pelvic masses.  IMPRESSION: Small calcified leiomyoma at upper uterus. Nonvisualization LEFT ovary. 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   CA125 done 4/30 = 4.1  She has no pelvic pain or other symptoms from the cyst.  Normal PAP a few years ago.  Problem List: Patient Active Problem List   Diagnosis Date Noted   Ovarian mass, right 04/23/2020   Normocytic anemia 12/29/2018    Past Medical History: Past Medical History:  Diagnosis Date   Anemia    normocytic   Bilateral ovarian cysts    only right ovarian cyst   Chronic diarrhea    Elevated lipids    History of Clostridioides difficile colitis 04/2020   pt. has been positive and negative since 2017. most recent result was negative. diarrhea persists   Hypertension     Past Surgical History: Past Surgical History:  Procedure Laterality Date   CHOLECYSTECTOMY     COLONOSCOPY WITH PROPOFOL N/A 09/07/2017   Procedure: COLONOSCOPY WITH PROPOFOL;  Surgeon: Toledo, Benay Pike, MD;  Location: [facility];  Service: Endoscopy;  Laterality: N/A;   COLONOSCOPY WITH PROPOFOL N/A 01/22/2019   Procedure: COLONOSCOPY WITH PROPOFOL;  Surgeon: Manya Silvas,  MD;  Location: [facility];  Service: Endoscopy;  Laterality: N/A;   ESOPHAGOGASTRODUODENOSCOPY (EGD) WITH PROPOFOL N/A 09/07/2017   Procedure: ESOPHAGOGASTRODUODENOSCOPY (EGD) WITH PROPOFOL;  Surgeon: Toledo, Benay Pike, MD;  Location: [facility] END[facility];  Service: Endoscopy;  Laterality: N/A;   TOTAL LAPAROSCOPIC HYSTERECTOMY WITH  BILATERAL SALPINGO OOPHORECTOMY N/A 04/30/2020   Procedure: HYSTERECTOMY TOTAL LAPAROSCOPIC/BILATERAL SALPINGO OOPHORECTOMY POSSIBLE PELVIC/AORTIC LYMPH NODE DISSECTION, OMENTECTOMY, LAPAROTOMY;  Surgeon: Mellody Drown, MD;  Location: [facility] ORS;  Service: Gynecology;  Laterality: N/A;     OB History:  OB History  No obstetric history on file.    Family History: Family History  Problem Relation Age of Onset   Hypertension Mother    Stroke Mother    Stroke Sister    Hypertension Sister    Hypertension Brother    Breast cancer Neg Hx     Social History: Social History   Socioeconomic History   Marital status: Divorced    Spouse name: Not on file   Number of children: Not on file   Years of education: Not on file   Highest education level: Not on file  Occupational History   Occupation: [employer]    Comment: retired  Tobacco Use   Smoking status: Never Smoker   Smokeless tobacco: Never Used  Substance and Sexual Activity   Alcohol use: Not Currently    Comment: rare sweet drink   Drug use: Not Currently   Sexual activity: Not Currently  Other Topics Concern   Not on file  Social History Narrative   She is retired from [employer].    Patient lives alone. Son to help out after surgery   Social Determinants of Health   Financial Resource Strain:    Difficulty of Paying Living Expenses:   Food Insecurity:    Worried About Charity fundraiser in the Last Year:    Arboriculturist in the Last Year:   Transportation Needs:    Film/video editor (Medical):    Lack of Transportation (Non-Medical):   Physical Activity:    Days of Exercise per Week:    Minutes of Exercise per Session:   Stress:    Feeling of Stress :   Social Connections:    Frequency of Communication with Friends and Family:    Frequency of Social Gatherings with Friends and Family:    Attends Religious Services:    Active Member of Clubs or Organizations:    Attends  Archivist Meetings:    Marital Status:   Intimate Partner Violence:    Fear of Current or Ex-Partner:    Emotionally Abused:    Physically Abused:    Sexually Abused:     Allergies: Allergies  Allergen Reactions   Mesalamine Diarrhea and Nausea Only    Current Medications: Current Outpatient Medications  Medication Sig Dispense Refill   amLODipine (NORVASC) 10 MG tablet Take 10 mg by mouth daily.     Ascorbic Acid (VITAMIN C) 500 MG CAPS Take 500 mg by mouth daily.      aspirin 81 MG EC tablet Take 81 mg by mouth daily.      calcium elemental as carbonate (TUMS ULTRA 1000) 400 MG chewable tablet Chew 1,000 mg by mouth daily.     Cholecalciferol (VITAMIN D-1000 MAX ST) 25 MCG (1000 UT) tablet Take 1,000 Units by mouth daily.      cyanocobalamin 1000 MCG tablet Take 1,000 mcg by mouth daily.     loperamide (IMODIUM A-D) 2 MG  tablet Take 2 mg by mouth 4 (four) times daily as needed for diarrhea or loose stools.     losartan-hydrochlorothiazide (HYZAAR) 100-25 MG tablet Take 1 tablet by mouth daily.     potassium chloride (K-DUR) 10 MEQ tablet Take 10 mEq by mouth 2 (two) times daily.      docusate sodium (COLACE) 100 MG capsule Take 1 capsule (100 mg total) by mouth 2 (two) times daily. To keep stools soft (Patient not taking: Reported on 05/28/2020) 30 capsule 0   iron polysaccharides (NIFEREX) 150 MG capsule Take 150 mg by mouth daily.      No current facility-administered medications for this visit.    Review of Systems General: negative for, fevers, chills, fatigue, changes in sleep, changes in weight or appetite Skin: negative for changes in color, texture, moles or lesions Eyes: negative for, changes in vision, pain, diplopia HEENT: negative for, change in hearing, pain, discharge, tinnitus, vertigo, voice changes, sore throat, neck masses Breasts: negative for breast lumps Pulmonary: negative for, dyspnea, orthopnea, productive cough Cardiac:  negative for, palpitations, syncope, pain, discomfort, pressure Gastrointestinal: negative for, dysphagia, nausea, vomiting, jaundice, pain, constipation, hematemesis, hematochezia Genitourinary/Sexual: negative for, dysuria, discharge, hesitancy, nocturia, retention, stones, infections, STD's, incontinence Ob/Gyn: negative for, irregular bleeding, pain Musculoskeletal: negative for, pain, stiffness, swelling, range of motion limitation Hematology: negative for, easy bruising, bleeding Neurologic/Psych: negative for, headaches, seizures, paralysis, weakness, tremor, change in gait, change in sensation, mood swings, depression, anxiety, change in memory  Objective:  Physical Examination:  BP 133/68 (BP Location: Right Arm, Patient Position: Sitting, Cuff Size: Large)    Pulse 94    Temp 98.4 F (36.9 C) (Oral)    Resp 18    Wt 97.3 kg    SpO2 100%    BMI 34.64 kg/m    ECOG Performance Status: 0 - Asymptomatic  General appearance: alert, cooperative and appears stated age HEENT:extra ocular movement intact and thyroid without masses Lymph node survey: non-palpable, axillary, inguinal, supraclavicular Cardiovascular: regular rate and rhythm, no murmurs or gallops Respiratory: normal air entry, lungs clear to auscultation and no rales, rhonchi or wheezing Breast exam: not examined. Abdomen: no hernias and well healed incision Back: inspection of back is normal Extremities: extremities normal, atraumatic, no cyanosis or edema Skin exam - normal coloration and turgor, no rashes, no suspicious skin lesions noted. Neurological exam reveals alert, oriented, normal speech, no focal findings or movement disorder noted.  Pelvic: exam chaperoned by nurse;   Vulva: normal appearing vulva with no masses, tenderness or lesions; Vagina: normal vagina;  Adnexa: normal adnexa in size, nontender and no masses;  Cervix: cuff healing  Rectal: not indicated    Lab Review Lab Results  Component Value  Date   WBC 9.7 04/29/2020   HGB 10.3 (L) 04/29/2020   HCT 31.9 (L) 04/29/2020   MCV 92.7 04/29/2020   PLT 465 (H) 04/29/2020     Chemistry      Component Value Date/Time   NA 134 (L) 04/29/2020 1021   K 3.6 04/29/2020 1021   CL 101 04/29/2020 1021   CO2 25 04/29/2020 1021   BUN 15 04/29/2020 1021   CREATININE 0.91 04/29/2020 1021      Component Value Date/Time   CALCIUM 9.2 04/29/2020 1021   ALKPHOS 213 (H) 04/29/2020 1021   AST 19 04/29/2020 1021   ALT 27 04/29/2020 1021   BILITOT 0.6 04/29/2020 1021          Assessment:  Christy Summers is  a 69 y.o. 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 Based on presence since 2018, lack of symptoms, small size and normal CA125 this is not likely to be a life threatening cancer, but could be a borderline tumor, cystadenoma or other neoplasm.   Medical co-morbidities complicating care: chronic normocytic anemia Hct 30%, history of C diff.   Postoperative visit normal. Well healed abdominal lap incisions. Cuff healing.    Plan:   Problem List Items Addressed This Visit      Other   Ovarian mass, right - Primary       Thank you for allowing me to participate in the care of this very pleasant patient!   SWhittemore, Student FNP

## 2020-06-11 ENCOUNTER — Ambulatory Visit: Payer: PPO

## 2020-06-18 ENCOUNTER — Ambulatory Visit
Admission: RE | Admit: 2020-06-18 | Discharge: 2020-06-18 | Disposition: A | Discharge status: Home / Self Care | Payer: PPO | Source: Ambulatory Visit | Attending: Internal Medicine | Admitting: Internal Medicine

## 2020-06-18 DIAGNOSIS — Z1231 Encounter for screening mammogram for malignant neoplasm of breast: Secondary | ICD-10-CM | POA: Insufficient documentation

## 2020-06-18 DIAGNOSIS — K529 Noninfective gastroenteritis and colitis, unspecified: Secondary | ICD-10-CM | POA: Diagnosis not present

## 2020-06-18 DIAGNOSIS — I10 Essential (primary) hypertension: Secondary | ICD-10-CM | POA: Diagnosis not present

## 2020-06-18 DIAGNOSIS — N289 Disorder of kidney and ureter, unspecified: Secondary | ICD-10-CM | POA: Diagnosis not present

## 2020-06-18 DIAGNOSIS — D649 Anemia, unspecified: Secondary | ICD-10-CM | POA: Diagnosis not present

## 2020-06-18 DIAGNOSIS — R197 Diarrhea, unspecified: Secondary | ICD-10-CM | POA: Diagnosis not present

## 2020-06-18 DIAGNOSIS — D72829 Elevated white blood cell count, unspecified: Secondary | ICD-10-CM | POA: Diagnosis not present

## 2020-06-18 IMAGING — MG DIGITAL SCREENING BILAT W/ TOMO W/ CAD
8 series · 8 of 24 positions shown · non-contrast
Comparison: Previous exam(s).

CLINICAL DATA: Screening.

EXAM:
DIGITAL SCREENING BILATERAL MAMMOGRAM WITH TOMO AND CAD

[R CC synth-2D]
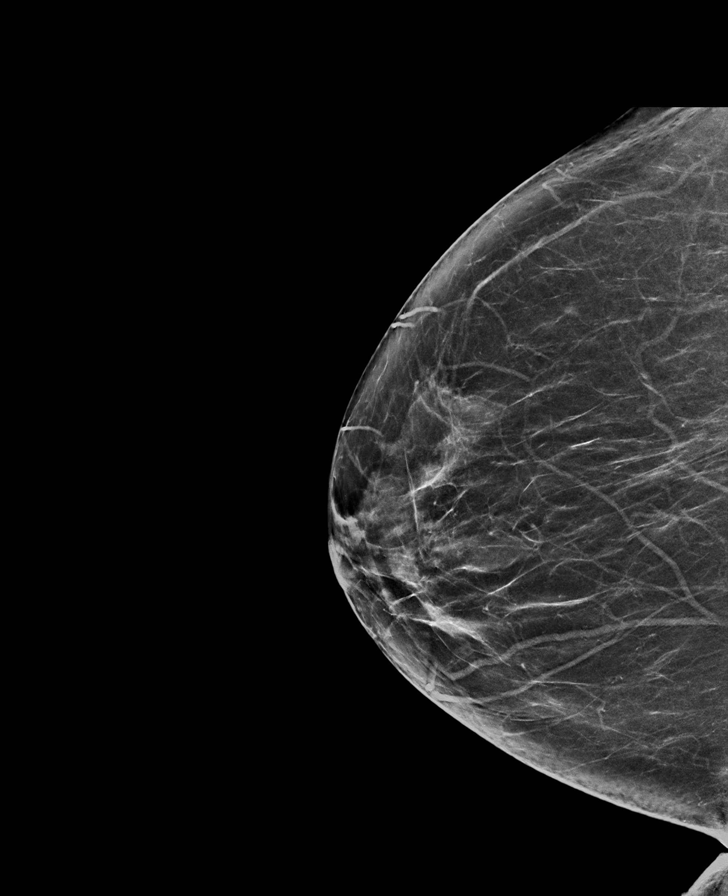

[R MLO synth-2D]
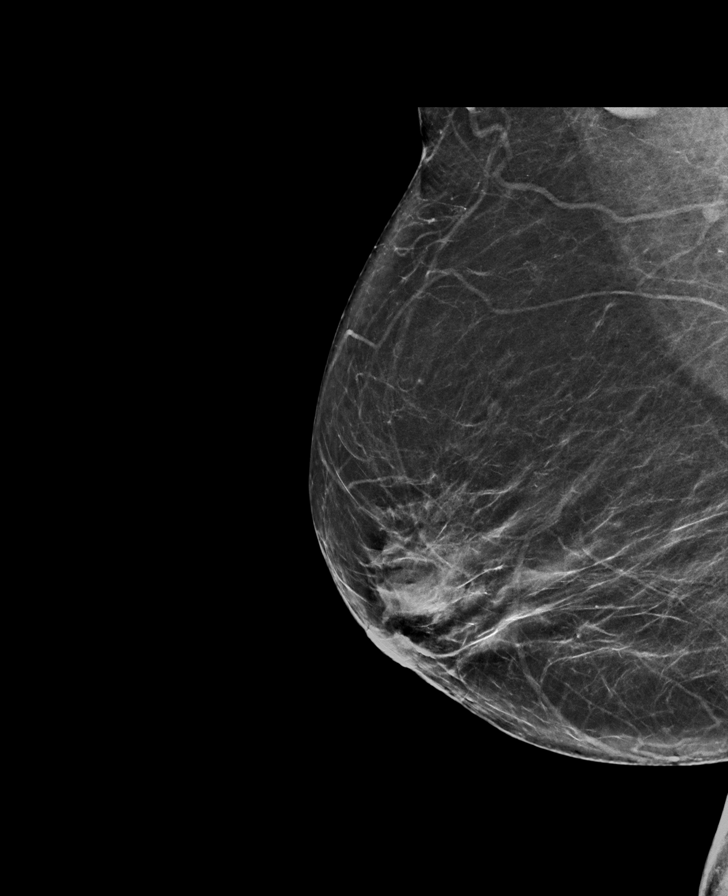

[L MLO synth-2D]
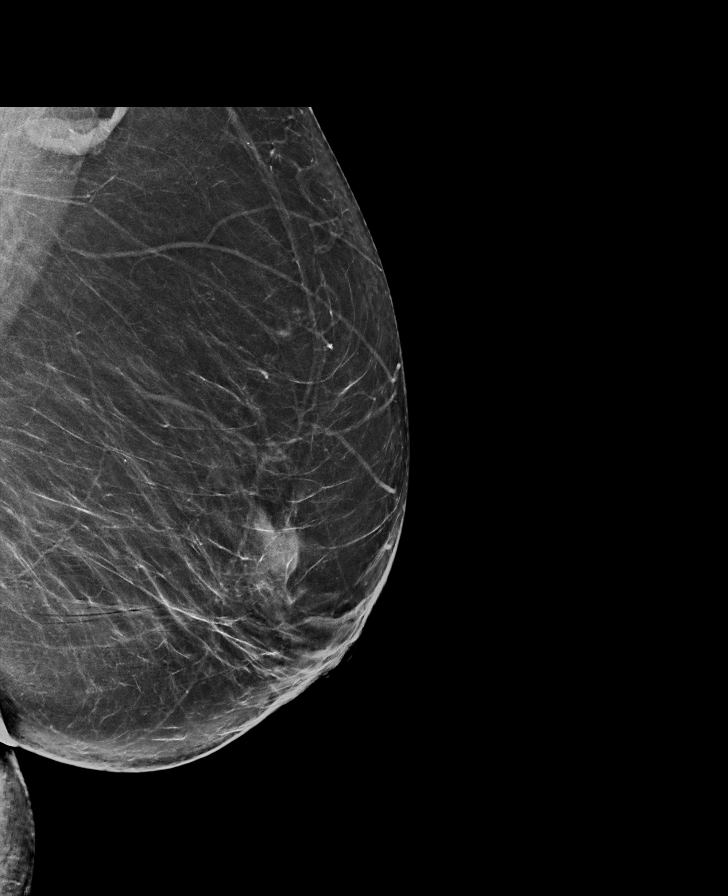

[L CC synth-2D]
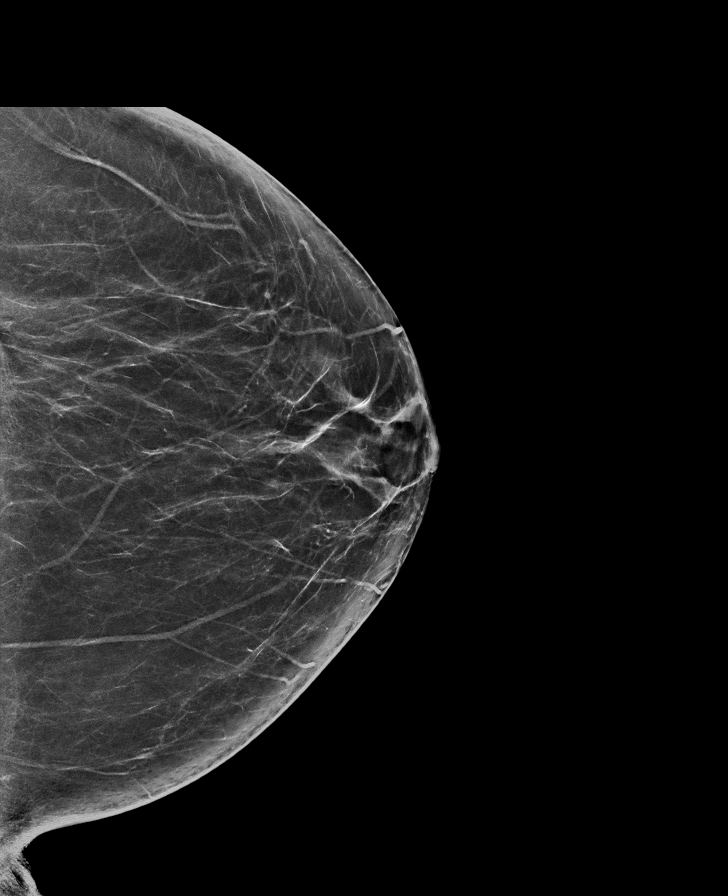

[R CC tomo · tomo slice 35/70.0]
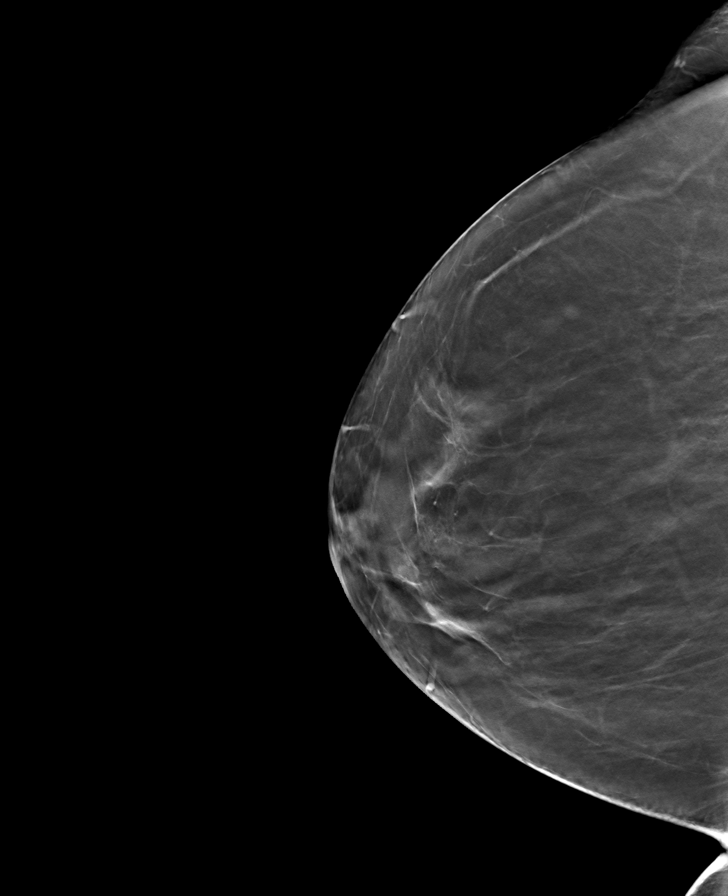

[L CC tomo · tomo slice 37/74.0]
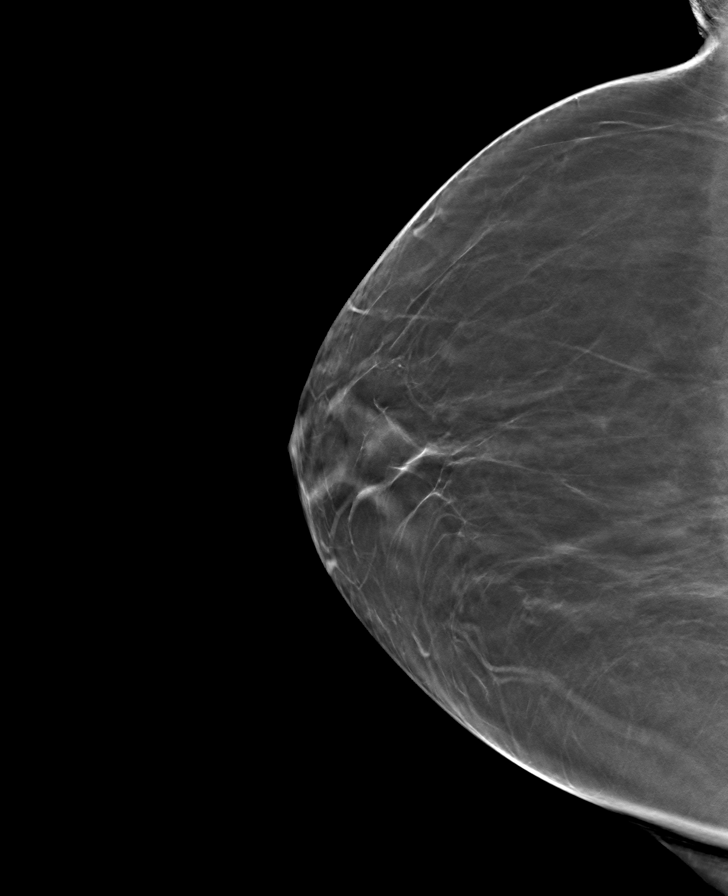

[L MLO tomo · tomo slice 39/78.0]
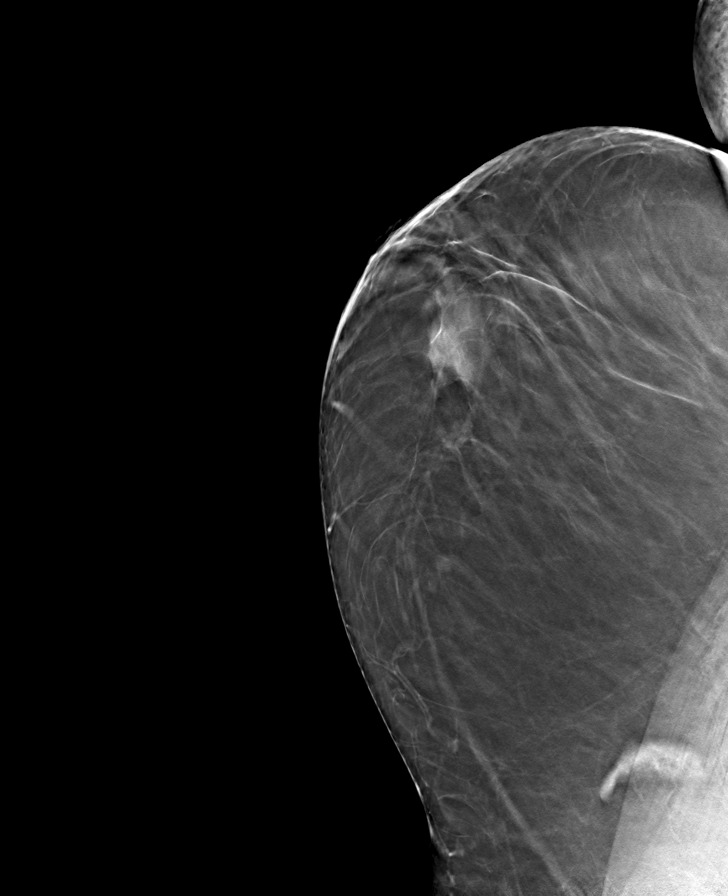

[R MLO tomo · tomo slice 37/74.0]
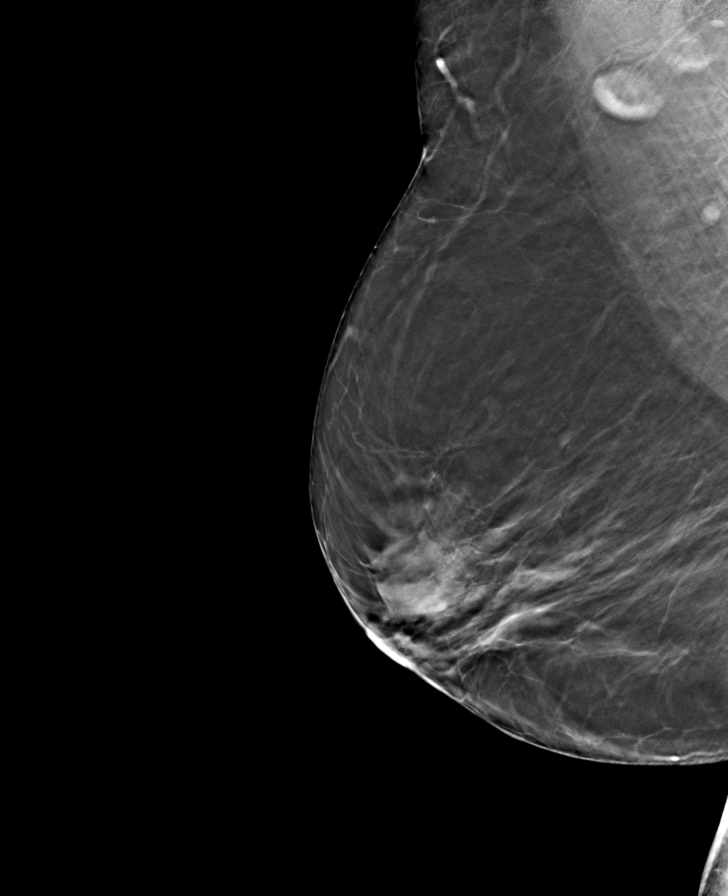

[8 of 24 positions shown; findings below may reference images not displayed]

ACR Breast Density Category b: There are scattered areas of
fibroglandular density.
FINDINGS: There are no findings suspicious for malignancy. Images were
processed with CAD.
IMPRESSION: No mammographic evidence of malignancy. A result letter of this
screening mammogram will be mailed directly to the patient.

RECOMMENDATION:
Screening mammogram in one year. (Code:[TQ])

BI-RADS CATEGORY  1: Negative.

## 2020-06-25 DIAGNOSIS — Z0000 Encounter for general adult medical examination without abnormal findings: Secondary | ICD-10-CM | POA: Diagnosis not present

## 2020-06-25 DIAGNOSIS — R945 Abnormal results of liver function studies: Secondary | ICD-10-CM | POA: Diagnosis not present

## 2020-06-25 DIAGNOSIS — E7800 Pure hypercholesterolemia, unspecified: Secondary | ICD-10-CM | POA: Diagnosis not present

## 2020-06-25 DIAGNOSIS — Z90710 Acquired absence of both cervix and uterus: Secondary | ICD-10-CM | POA: Diagnosis not present

## 2020-06-25 DIAGNOSIS — Z8619 Personal history of other infectious and parasitic diseases: Secondary | ICD-10-CM | POA: Diagnosis not present

## 2020-06-25 DIAGNOSIS — E8809 Other disorders of plasma-protein metabolism, not elsewhere classified: Secondary | ICD-10-CM | POA: Diagnosis not present

## 2020-06-25 DIAGNOSIS — I10 Essential (primary) hypertension: Secondary | ICD-10-CM | POA: Diagnosis not present

## 2020-06-25 DIAGNOSIS — Z6834 Body mass index (BMI) 34.0-34.9, adult: Secondary | ICD-10-CM | POA: Diagnosis not present

## 2020-06-25 DIAGNOSIS — D649 Anemia, unspecified: Secondary | ICD-10-CM | POA: Diagnosis not present

## 2020-07-04 ENCOUNTER — Other Ambulatory Visit: Payer: Self-pay | Admitting: Gastroenterology

## 2020-07-04 DIAGNOSIS — K529 Noninfective gastroenteritis and colitis, unspecified: Secondary | ICD-10-CM

## 2020-07-04 DIAGNOSIS — R748 Abnormal levels of other serum enzymes: Secondary | ICD-10-CM | POA: Diagnosis not present

## 2020-07-04 DIAGNOSIS — Z01818 Encounter for other preprocedural examination: Secondary | ICD-10-CM | POA: Diagnosis not present

## 2020-07-07 DIAGNOSIS — K529 Noninfective gastroenteritis and colitis, unspecified: Secondary | ICD-10-CM | POA: Diagnosis not present

## 2020-07-18 ENCOUNTER — Telehealth: Payer: Self-pay | Admitting: *Deleted

## 2020-07-18 ENCOUNTER — Inpatient Hospital Stay: Payer: PPO

## 2020-07-18 ENCOUNTER — Inpatient Hospital Stay: Payer: PPO | Admitting: Internal Medicine

## 2020-07-18 NOTE — Telephone Encounter (Signed)
Called patient on 07/17/2020 for prescreening questions. Patient informed Lovena Le, CMA that she tested positive for Cdiff this week. Per Dr. Rogue Bussing, he would like to hold off on clinic apt for 3 weeks. Patient notified on 07/18/20 with new apt by Marsh Dolly on scheduling team.

## 2020-07-20 ENCOUNTER — Ambulatory Visit
Admission: RE | Admit: 2020-07-20 | Discharge: 2020-07-20 | Disposition: A | Discharge status: Home / Self Care | Payer: PPO | Source: Ambulatory Visit | Attending: Gastroenterology | Admitting: Gastroenterology

## 2020-07-20 ENCOUNTER — Other Ambulatory Visit: Payer: Self-pay | Admitting: Gastroenterology

## 2020-07-20 DIAGNOSIS — K529 Noninfective gastroenteritis and colitis, unspecified: Secondary | ICD-10-CM | POA: Diagnosis not present

## 2020-07-20 DIAGNOSIS — R748 Abnormal levels of other serum enzymes: Secondary | ICD-10-CM | POA: Diagnosis not present

## 2020-07-20 DIAGNOSIS — K838 Other specified diseases of biliary tract: Secondary | ICD-10-CM | POA: Diagnosis not present

## 2020-07-20 IMAGING — MR MR ABDOMEN WO/W CM MRCP
19 of 21 series · 44 of 48 positions shown · IV contrast (10ml Gadavist)
Comparison: CT [DATE]

CLINICAL DATA: Elevated LFTs at chronic diarrhea, recent diagnosis
of C difficile

EXAM:
MRI ABDOMEN WITHOUT AND WITH CONTRAST (INCLUDING MRCP)
TECHNIQUE: Multiplanar multisequence MR imaging of the abdomen was performed
both before and after the administration of intravenous contrast.
Heavily T2-weighted images of the biliary and pancreatic ducts were
obtained, and three-dimensional MRCP images were rendered by post
processing.
CONTRAST:  10mL GADAVIST GADOBUTROL 1 MMOL/ML IV SOLN

[Series 3: T2 · coronal · 6.0mm · 1.19mm/px · 2 of 33 slices shown (1 of 2)]
[im 1/33]
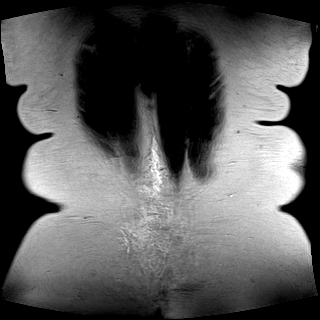
[im 33/33]
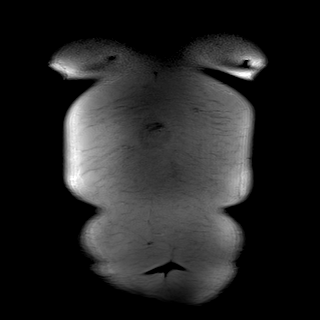

[Series 4: T2 · axial · 6.0mm · 1.19mm/px · z∈[-3,+220]mm · 2 of 32 slices shown (2 of 2)]
[im 1/32]
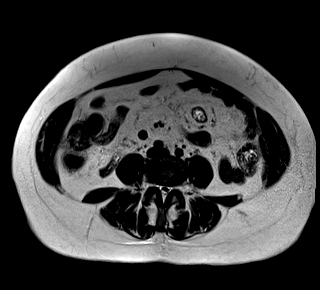
[im 32/32]
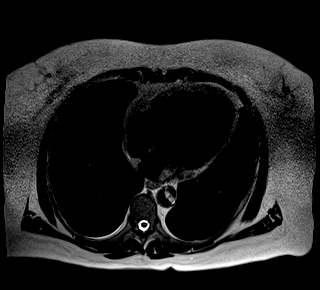

[Series 5: T1 · axial · 6.0mm · 0.74mm/px · 1 of 32 slices shown (1 of 2)]
[im 1/32]
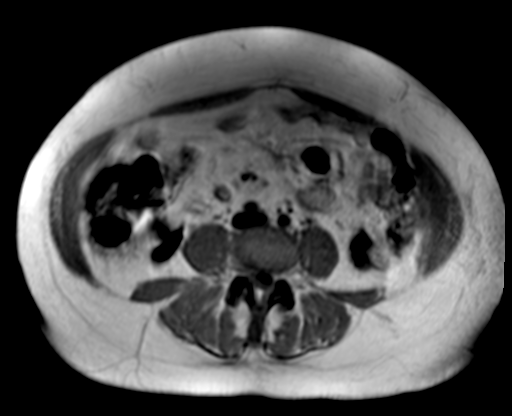

[Series 5: T1 · axial · 6.0mm · 0.74mm/px · 1 of 32 slices shown (2 of 2)]
[im 1/32]
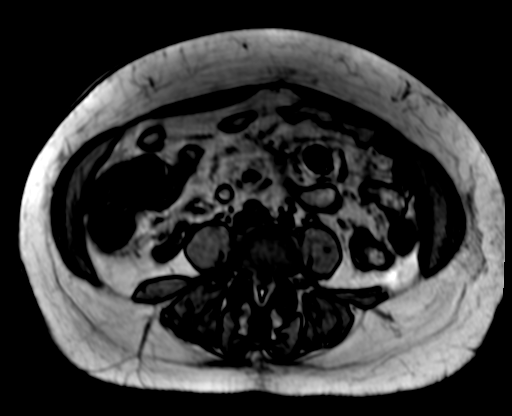

[Series 7: T2 fat-sat · axial · 6.0mm · 1.19mm/px · 1 of 34 slices shown]
[im 1/34]
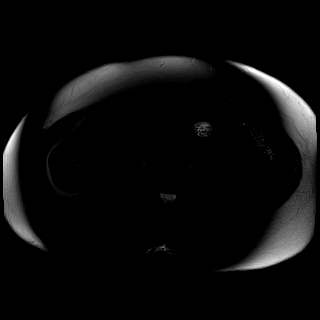

[Series 12: ax dwi_tracew · axial · 6.0mm · 1.42mm/px · z∈[+21,+259]mm · 4 of 102 slices shown]
[im 1/102]
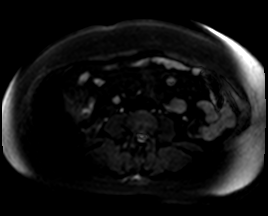
[im 34/102]
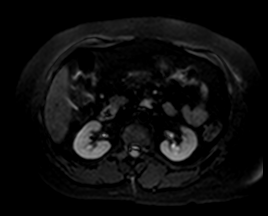
[im 68/102]
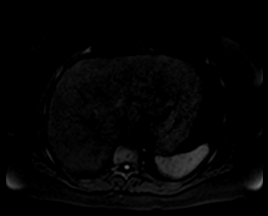
[im 102/102]
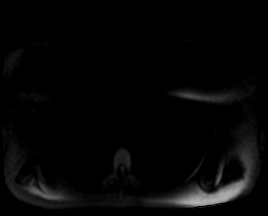

[Series 13: ax dwi_adc · axial · 6.0mm · 1.42mm/px · 1 of 34 slices shown]
[im 1/34]
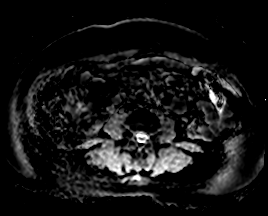

[Series 14: MRCP · coronal · 3.0mm · 1.12mm/px · 1 of 23 slices shown]
[im 1/23]
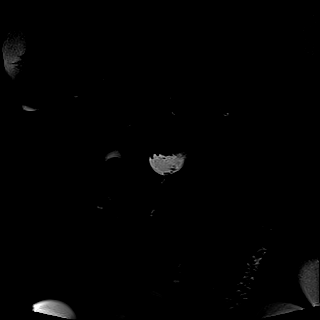

[Series 15: radials · coronal · 50.0mm · 0.78mm/px · 1 of 5 slices shown]
[im 1/5]
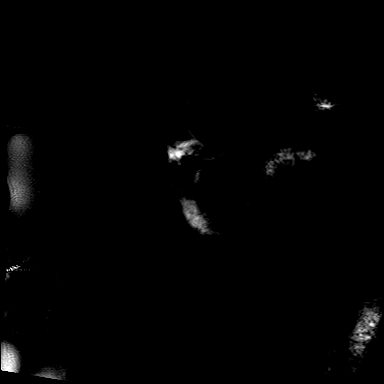

[Series 16: T1 dynamic fat-sat · axial · non-contrast · 3.0mm · 1.19mm/px · z∈[-22,+239]mm · 3 of 88 slices shown (1 of 5)]
[im 1/88]
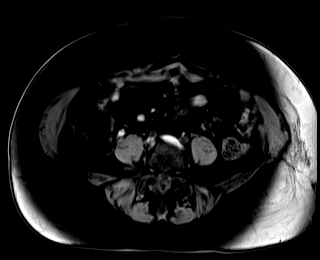
[im 44/88]
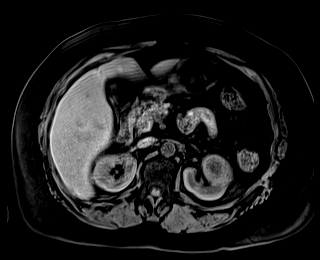
[im 88/88]
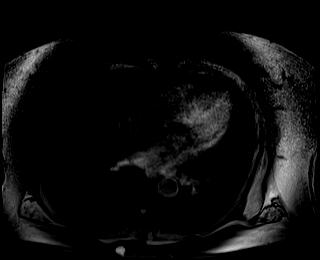

[Series 17: T1 dynamic fat-sat post-contrast · axial · 3.0mm · 1.19mm/px · z∈[-22,+239]mm · 3 of 88 slices shown (1 of 4)]
[im 1/88]
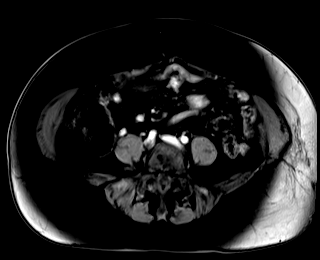
[im 44/88]
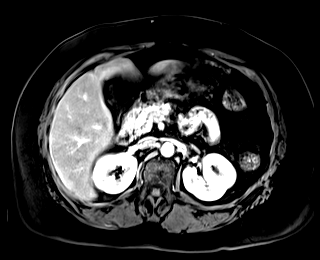
[im 88/88]
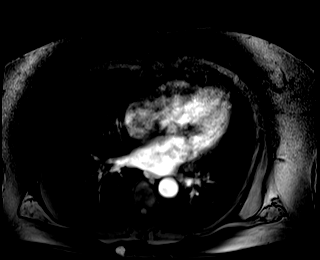

[Series 18: T1 dynamic fat-sat · axial · 3.0mm · 1.19mm/px · z∈[-22,+239]mm · 3 of 88 slices shown (2 of 5)]
[im 1/88]
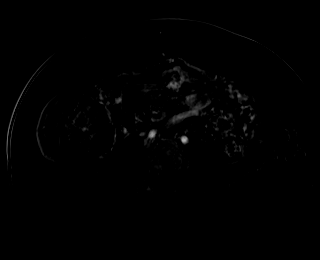
[im 44/88]
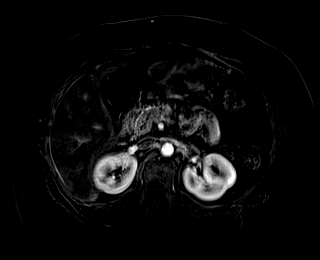
[im 88/88]
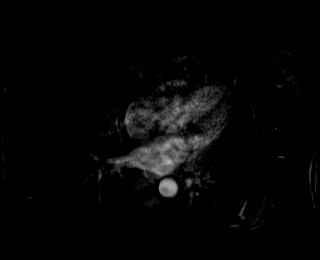

[Series 19: T1 dynamic fat-sat post-contrast · axial · 3.0mm · 1.19mm/px · z∈[-22,+239]mm · 3 of 88 slices shown (2 of 4)]
[im 1/88]
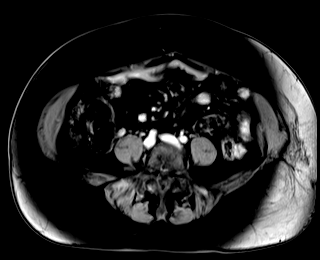
[im 44/88]
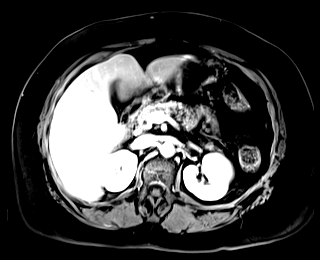
[im 88/88]
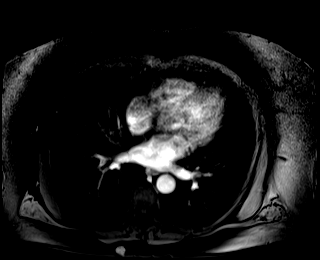

[Series 20: T1 dynamic fat-sat · axial · 3.0mm · 1.19mm/px · z∈[-22,+239]mm · 3 of 88 slices shown (3 of 5)]
[im 1/88]
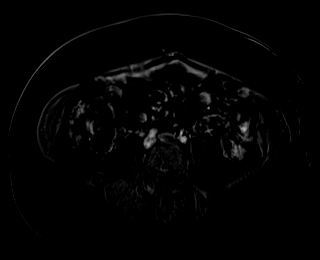
[im 44/88]
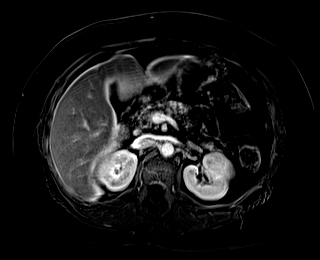
[im 88/88]
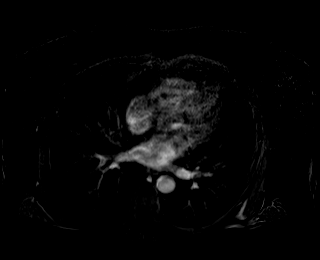

[Series 21: T1 dynamic fat-sat post-contrast · axial · 3.0mm · 1.19mm/px · z∈[-22,+239]mm · 3 of 88 slices shown (3 of 4)]
[im 1/88]
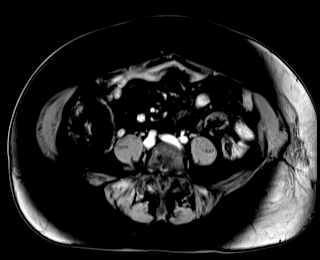
[im 44/88]
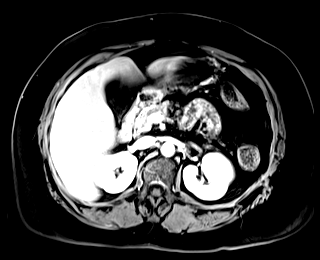
[im 88/88]
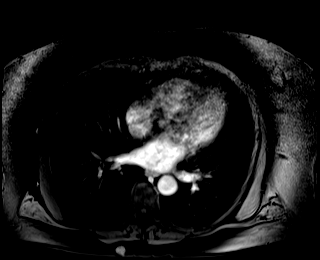

[Series 22: T1 dynamic fat-sat · axial · 3.0mm · 1.19mm/px · z∈[-22,+239]mm · 3 of 88 slices shown (4 of 5)]
[im 1/88]
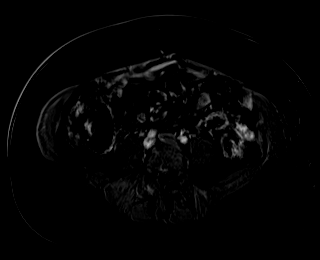
[im 44/88]
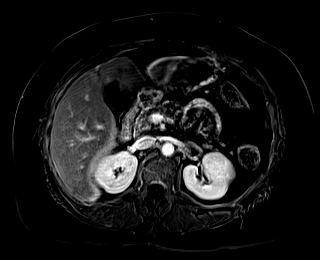
[im 88/88]
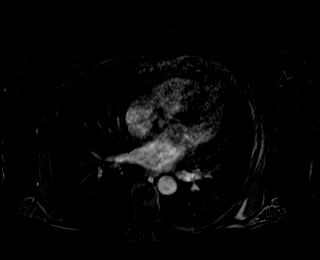

[Series 23: T1 dynamic post-contrast · coronal · 3.0mm · 1.31mm/px · 3 of 72 slices shown]
[im 1/72]
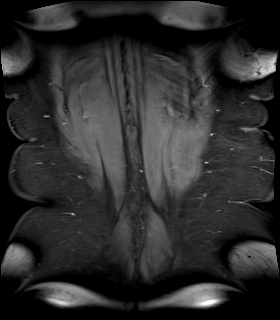
[im 36/72]
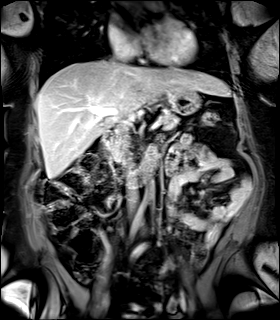
[im 72/72]
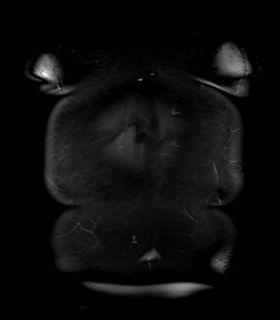

[Series 24: T1 dynamic fat-sat post-contrast · axial · 3.0mm · 1.19mm/px · z∈[-22,+239]mm · 3 of 88 slices shown (4 of 4)]
[im 1/88]
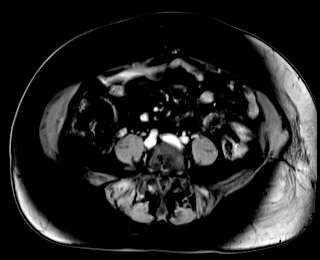
[im 44/88]
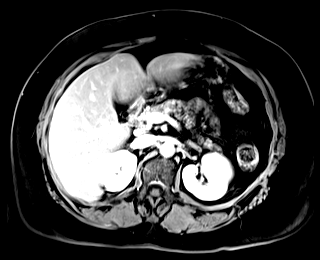
[im 88/88]
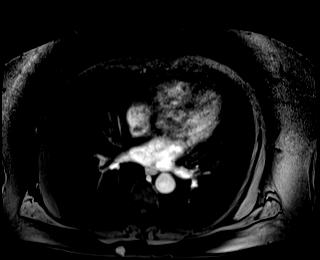

[Series 25: T1 dynamic fat-sat · axial · 3.0mm · 1.19mm/px · z∈[-22,+239]mm · 3 of 88 slices shown (5 of 5)]
[im 1/88]
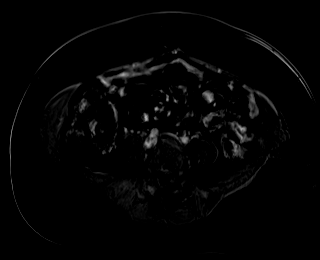
[im 44/88]
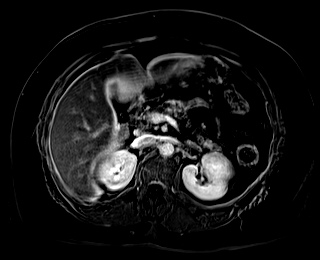
[im 88/88]
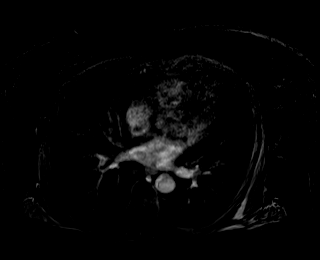

[44 of 48 positions shown; findings below may reference images not displayed]

FINDINGS: Lower chest: No sign of effusion or consolidation. Limited
assessment of the lung bases on MRI.

Hepatobiliary: No focal, suspicious hepatic lesion.

Irregularity of the common bile duct with alternating dilation and
narrowing of the common bile duct showing of beaded appearance.
Similar appearance of intrahepatic biliary tree. No dominant
intrahepatic stricture or focal, suspicious hepatic lesion. Post
cholecystectomy.

Dominant area of narrowing in the extrahepatic biliary tree just
proximal to the cystic duct confluence best seen on image 50 of
series 10 this measures approximately 1 cm in length. Maximal
caliber of the common bile duct is only 5 mm.

Post contrast there is thickening of the biliary epithelium along
the common bile duct best demonstrated on images 33 through 45 at
the porta hepatis of the common hepatic duct

Portal and hepatic veins are patent.

Pancreas: Pancreatic parenchyma with normal intrinsic T1 signal.
Minimal pancreatic ductal dilation though less than 4 mm. No
peripancreatic inflammation.

Spleen:  Spleen normal in size and contour.

Adrenals/Urinary Tract: Renal glands are normal. No hydronephrosis.
Symmetric renal enhancement. No suspicious renal lesion.

Stomach/Bowel: No acute gastrointestinal process to the extent
evaluated. Study not protocol for bowel evaluation.

Vascular/Lymphatic: Vascular structures in the abdomen are patent.
Mild periportal lymph node enlargement, nonspecific given other
findings largest 11 mm.

Other:  No ascites.

Musculoskeletal: No suspicious bone lesions identified.
IMPRESSION: 1. Irregularity of the common bile duct with alternating dilation
and narrowing of the common bile duct showing of beaded appearance.
Findings are suspicious for primary sclerosing cholangitis. Also
correlate with any history of repeated infectious cholangitis and
with any other risk factors for cholangitis including IgG 4 levels.
2. Given the pattern above ERCP may be helpful as there is long
segment stricture of the common bile duct on today's evaluation to
exclude the possibility of neoplastic changes in the common bile
duct.
3. Liver contours are mildly lobular though there is no imaging
finding to support the diagnosis of cirrhosis and or fibrosis of
hepatic parenchyma at this time.
4. Mild periportal lymph node enlargement, nonspecific given other
findings largest 11 mm.

## 2020-07-20 MED ORDER — GADOBUTROL 1 MMOL/ML IV SOLN
10.0000 mL | Freq: Once | INTRAVENOUS | Status: AC | PRN
  Administered 2020-07-20: 10 mL via INTRAVENOUS

## 2020-07-22 DIAGNOSIS — K529 Noninfective gastroenteritis and colitis, unspecified: Secondary | ICD-10-CM | POA: Diagnosis not present

## 2020-07-22 DIAGNOSIS — R9389 Abnormal findings on diagnostic imaging of other specified body structures: Secondary | ICD-10-CM | POA: Diagnosis not present

## 2020-07-22 DIAGNOSIS — K838 Other specified diseases of biliary tract: Secondary | ICD-10-CM | POA: Diagnosis not present

## 2020-07-22 DIAGNOSIS — R748 Abnormal levels of other serum enzymes: Secondary | ICD-10-CM | POA: Diagnosis not present

## 2020-08-05 DIAGNOSIS — Z01818 Encounter for other preprocedural examination: Secondary | ICD-10-CM | POA: Diagnosis not present

## 2020-08-05 DIAGNOSIS — M8588 Other specified disorders of bone density and structure, other site: Secondary | ICD-10-CM | POA: Diagnosis not present

## 2020-08-07 DIAGNOSIS — K529 Noninfective gastroenteritis and colitis, unspecified: Secondary | ICD-10-CM | POA: Diagnosis not present

## 2020-08-07 DIAGNOSIS — K5289 Other specified noninfective gastroenteritis and colitis: Secondary | ICD-10-CM | POA: Diagnosis not present

## 2020-08-07 DIAGNOSIS — K640 First degree hemorrhoids: Secondary | ICD-10-CM | POA: Diagnosis not present

## 2020-08-07 DIAGNOSIS — K51918 Ulcerative colitis, unspecified with other complication: Secondary | ICD-10-CM | POA: Diagnosis not present

## 2020-08-07 DIAGNOSIS — K626 Ulcer of anus and rectum: Secondary | ICD-10-CM | POA: Diagnosis not present

## 2020-08-07 DIAGNOSIS — K6289 Other specified diseases of anus and rectum: Secondary | ICD-10-CM | POA: Diagnosis not present

## 2020-08-11 ENCOUNTER — Inpatient Hospital Stay (HOSPITAL_BASED_OUTPATIENT_CLINIC_OR_DEPARTMENT_OTHER): Payer: PPO | Admitting: Internal Medicine

## 2020-08-11 ENCOUNTER — Other Ambulatory Visit: Payer: Self-pay

## 2020-08-11 ENCOUNTER — Other Ambulatory Visit: Payer: Self-pay | Admitting: Gastroenterology

## 2020-08-11 ENCOUNTER — Inpatient Hospital Stay: Payer: PPO | Attending: Internal Medicine

## 2020-08-11 ENCOUNTER — Encounter: Payer: Self-pay | Admitting: Internal Medicine

## 2020-08-11 DIAGNOSIS — D649 Anemia, unspecified: Secondary | ICD-10-CM | POA: Diagnosis not present

## 2020-08-11 DIAGNOSIS — R5383 Other fatigue: Secondary | ICD-10-CM | POA: Diagnosis not present

## 2020-08-11 DIAGNOSIS — K529 Noninfective gastroenteritis and colitis, unspecified: Secondary | ICD-10-CM | POA: Insufficient documentation

## 2020-08-11 DIAGNOSIS — R7989 Other specified abnormal findings of blood chemistry: Secondary | ICD-10-CM | POA: Diagnosis not present

## 2020-08-11 DIAGNOSIS — Z79899 Other long term (current) drug therapy: Secondary | ICD-10-CM | POA: Diagnosis not present

## 2020-08-11 DIAGNOSIS — R5381 Other malaise: Secondary | ICD-10-CM | POA: Insufficient documentation

## 2020-08-11 DIAGNOSIS — Z7982 Long term (current) use of aspirin: Secondary | ICD-10-CM | POA: Diagnosis not present

## 2020-08-11 DIAGNOSIS — I10 Essential (primary) hypertension: Secondary | ICD-10-CM | POA: Diagnosis not present

## 2020-08-11 DIAGNOSIS — Z8719 Personal history of other diseases of the digestive system: Secondary | ICD-10-CM

## 2020-08-11 LAB — COMPREHENSIVE METABOLIC PANEL
ALT: 143 U/L — ABNORMAL HIGH (ref 0–44)
AST: 76 U/L — ABNORMAL HIGH (ref 15–41)
Albumin: 4 g/dL (ref 3.5–5.0)
Alkaline Phosphatase: 207 U/L — ABNORMAL HIGH (ref 38–126)
Anion gap: 8 (ref 5–15)
BUN: 21 mg/dL (ref 8–23)
CO2: 24 mmol/L (ref 22–32)
Calcium: 9.2 mg/dL (ref 8.9–10.3)
Chloride: 105 mmol/L (ref 98–111)
Creatinine, Ser: 0.92 mg/dL (ref 0.44–1.00)
GFR calc Af Amer: >60 mL/min (ref >60)
GFR calc non Af Amer: >60 mL/min (ref >60)
Glucose, Bld: 115 mg/dL — ABNORMAL HIGH (ref 70–99)
Potassium: 3.6 mmol/L (ref 3.5–5.1)
Sodium: 137 mmol/L (ref 135–145)
Total Bilirubin: 0.6 mg/dL (ref 0.3–1.2)
Total Protein: 8.7 g/dL — ABNORMAL HIGH (ref 6.5–8.1)

## 2020-08-11 LAB — CBC WITH DIFFERENTIAL/PLATELET
Abs Immature Granulocytes: 0.02 10*3/uL (ref 0.00–0.07)
Basophils Absolute: 0 10*3/uL (ref 0.0–0.1)
Basophils Relative: 0 %
Eosinophils Absolute: 0.2 10*3/uL (ref 0.0–0.5)
Eosinophils Relative: 3 %
HCT: 34.7 % — ABNORMAL LOW (ref 36.0–46.0)
Hemoglobin: 11.5 g/dL — ABNORMAL LOW (ref 12.0–15.0)
Immature Granulocytes: 0 %
Lymphocytes Relative: 37 %
Lymphs Abs: 2.5 10*3/uL (ref 0.7–4.0)
MCH: 30.3 pg (ref 26.0–34.0)
MCHC: 33.1 g/dL (ref 30.0–36.0)
MCV: 91.6 fL (ref 80.0–100.0)
Monocytes Absolute: 0.9 10*3/uL (ref 0.1–1.0)
Monocytes Relative: 13 %
Neutro Abs: 3.1 10*3/uL (ref 1.7–7.7)
Neutrophils Relative %: 47 %
Platelets: 310 10*3/uL (ref 150–400)
RBC: 3.79 MIL/uL — ABNORMAL LOW (ref 3.87–5.11)
RDW: 14.4 % (ref 11.5–15.5)
WBC: 6.7 10*3/uL (ref 4.0–10.5)
nRBC: 0 % (ref 0.0–0.2)

## 2020-08-11 LAB — C-REACTIVE PROTEIN: CRP: 1.3 mg/dL — ABNORMAL HIGH (ref <1.0)

## 2020-08-11 NOTE — Assessment & Plan Note (Addendum)
#   Anemia-normocytic; between 9-10 since 2019.  Today hemoglobin is 11.5 stable; likely secondary to chronic disease/inflammation [C. difficile versus others].  No further work-up is recommended at this time.  #Right ovarian cystadenoma-s/p resection; reviewed pathology with the patient; reviewed GYN oncology notes and recommendations.  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  Defer to GI for further evaluation/recommendations.  #Chronic diarrhea-[3 years] s/p colo [Dr.Elliot]-April 2021 CT scan sigmoid colitis; currently improved.  Follow-up with GI.  # DISPOSITION: Patient will call if any worsening anemia noted. # Follow up as needed-Dr.B  Cc; [place]

## 2020-08-11 NOTE — Progress Notes (Signed)
[place] NOTE  Patient Care Team: Tracie Harrier, MD as PCP - General (Internal Medicine) Mellody Drown, MD as Referring Physician (Obstetrics)  CHIEF COMPLAINTS/PURPOSE OF CONSULTATION:  ANEMIA/polyclonal gammopathy  #2018-Anemia hemoglobin 9-10/normocytic-second to chronic inflammation//C. 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 Woodroe Mode.  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 C.diff s/p Vanco PO  [Dr.Elliot; [place]  Oncology History   No history exists.     HISTORY OF PRESENTING ILLNESS:  Christy Summers 69 y.o.  female patient with history of chronic anemia unclear etiology; and also chronic diarrhea is here for follow-up.  Patient in the interim underwent surgery for her right ovarian mass.   She also had an MRCP done for elevated liver numbers with her GI doctor.  Otherwise she feels overall okay.  No nausea no vomiting.  Loss of appetite or weight loss.  Review of Systems  Constitutional: Positive for malaise/fatigue. Negative for chills, diaphoresis, fever and weight loss.  HENT: Negative for nosebleeds and sore throat.   Eyes: Negative for double vision.  Respiratory: Negative for cough, hemoptysis, sputum production, shortness of breath and wheezing.   Cardiovascular: Negative for chest pain, palpitations, orthopnea and leg swelling.  Gastrointestinal: Positive for diarrhea. Negative for abdominal pain, blood in stool, constipation, heartburn, melena, nausea and vomiting.  Genitourinary: Negative for dysuria, frequency and urgency.  Musculoskeletal: Negative for back pain and joint pain.  Skin: Negative.  Negative for itching and rash.  Neurological: Negative for dizziness, tingling, focal weakness, weakness and headaches.  Endo/Heme/Allergies: Does not bruise/bleed easily.   Psychiatric/Behavioral: Negative for depression. The patient is not nervous/anxious and does not have insomnia.      MEDICAL HISTORY:  Past Medical History:  Diagnosis Date  . Anemia    normocytic  . Bilateral ovarian cysts    only right ovarian cyst  . Chronic diarrhea   . Elevated lipids   . History of Clostridioides difficile colitis 04/2020   pt. has been positive and negative since 2017. most recent result was negative. diarrhea persists  . Hypertension     SURGICAL HISTORY: Past Surgical History:  Procedure Laterality Date  . CHOLECYSTECTOMY    . COLONOSCOPY WITH PROPOFOL N/A 09/07/2017   Procedure: COLONOSCOPY WITH PROPOFOL;  Surgeon: Toledo, Benay Pike, MD;  Location: [facility] ENDOSCOPY;  Service: Endoscopy;  Laterality: N/A;  . COLONOSCOPY WITH PROPOFOL N/A 01/22/2019   Procedure: COLONOSCOPY WITH PROPOFOL;  Surgeon: Manya Silvas, MD;  Location: [facility] ENDOSCOPY;  Service: Endoscopy;  Laterality: N/A;  . ESOPHAGOGASTRODUODENOSCOPY (EGD) WITH PROPOFOL N/A 09/07/2017   Procedure: ESOPHAGOGASTRODUODENOSCOPY (EGD) WITH PROPOFOL;  Surgeon: Toledo, Benay Pike, MD;  Location: [facility] ENDOSCOPY;  Service: Endoscopy;  Laterality: N/A;  . TOTAL LAPAROSCOPIC HYSTERECTOMY WITH BILATERAL SALPINGO OOPHORECTOMY N/A 04/30/2020   Procedure: HYSTERECTOMY TOTAL LAPAROSCOPIC/BILATERAL SALPINGO OOPHORECTOMY POSSIBLE PELVIC/AORTIC LYMPH NODE DISSECTION, OMENTECTOMY, LAPAROTOMY;  Surgeon: Mellody Drown, MD;  Location: [facility] ORS;  Service: Gynecology;  Laterality: N/A;    SOCIAL HISTORY: Social History   Socioeconomic History  . Marital status: Divorced    Spouse name: Not on file  . Number of children: Not on file  . Years of education: Not on file  . Highest education level: Not on file  Occupational History  . Occupation: [employer]    Comment: retired  Tobacco Use  . Smoking status: Never Smoker  . Smokeless tobacco: Never Used  Vaping Use  .  Vaping Use: Never used  Substance and Sexual  Activity  . Alcohol use: Not Currently    Comment: rare sweet drink  . Drug use: Not Currently  . Sexual activity: Not Currently  Other Topics Concern  . Not on file  Social History Narrative   She is retired from [employer].    Patient lives alone. Son to help out after surgery   Social Determinants of Health   Financial Resource Strain:   . Difficulty of Paying Living Expenses: Not on file  Food Insecurity:   . Worried About Charity fundraiser in the Last Year: Not on file  . Ran Out of Food in the Last Year: Not on file  Transportation Needs:   . Lack of Transportation (Medical): Not on file  . Lack of Transportation (Non-Medical): Not on file  Physical Activity:   . Days of Exercise per Week: Not on file  . Minutes of Exercise per Session: Not on file  Stress:   . Feeling of Stress : Not on file  Social Connections:   . Frequency of Communication with Friends and Family: Not on file  . Frequency of Social Gatherings with Friends and Family: Not on file  . Attends Religious Services: Not on file  . Active Member of Clubs or Organizations: Not on file  . Attends Archivist Meetings: Not on file  . Marital Status: Not on file  Intimate Partner Violence:   . Fear of Current or Ex-Partner: Not on file  . Emotionally Abused: Not on file  . Physically Abused: Not on file  . Sexually Abused: Not on file    FAMILY HISTORY: Family History  Problem Relation Age of Onset  . Hypertension Mother   . Stroke Mother   . Stroke Sister   . Hypertension Sister   . Hypertension Brother   . Breast cancer Neg Hx     ALLERGIES:  is allergic to mesalamine.  MEDICATIONS:  Current Outpatient Medications  Medication Sig Dispense Refill  . amLODipine (NORVASC) 10 MG tablet Take 10 mg by mouth daily.    . Ascorbic Acid (VITAMIN C) 500 MG CAPS Take 500 mg by mouth daily.     Marland Kitchen aspirin 81 MG EC tablet Take 81 mg by mouth daily.     . calcium elemental as carbonate (TUMS ULTRA  1000) 400 MG chewable tablet Chew 1,000 mg by mouth daily.    . Cholecalciferol (VITAMIN D-1000 MAX ST) 25 MCG (1000 UT) tablet Take 1,000 Units by mouth daily.     . cyanocobalamin 1000 MCG tablet Take 1,000 mcg by mouth daily.    Marland Kitchen loperamide (IMODIUM A-D) 2 MG tablet Take 2 mg by mouth 4 (four) times daily as needed for diarrhea or loose stools.    Marland Kitchen losartan-hydrochlorothiazide (HYZAAR) 100-25 MG tablet Take 1 tablet by mouth daily.    . potassium chloride (K-DUR) 10 MEQ tablet Take 10 mEq by mouth 2 (two) times daily.     . iron polysaccharides (NIFEREX) 150 MG capsule Take 150 mg by mouth daily.      No current facility-administered medications for this visit.      Marland Kitchen  PHYSICAL EXAMINATION: ECOG PERFORMANCE STATUS: 1 - Symptomatic but completely ambulatory  Vitals:   08/11/20 1017  BP: (!) 141/62  Pulse: 65  Resp: 16  Temp: 97.9 F (36.6 C)  SpO2: 99%   Filed Weights   08/11/20 1017  Weight: 226 lb (102.5 kg)    Physical Exam  HENT:     Head: Normocephalic and atraumatic.     Mouth/Throat:     Pharynx: No oropharyngeal exudate.  Eyes:     Pupils: Pupils are equal, round, and reactive to light.  Cardiovascular:     Rate and Rhythm: Normal rate and regular rhythm.  Pulmonary:     Effort: No respiratory distress.     Breath sounds: No wheezing.  Abdominal:     General: Bowel sounds are normal. There is no distension.     Palpations: Abdomen is soft. There is no mass.     Tenderness: There is no abdominal tenderness. There is no guarding or rebound.  Musculoskeletal:        General: No tenderness. Normal range of motion.     Cervical back: Normal range of motion and neck supple.  Skin:    General: Skin is warm.  Neurological:     Mental Status: She is alert and oriented to person, place, and time.  Psychiatric:        Mood and Affect: Affect normal.    Results for CLARICE, ZULAUF (MRN [id number]) as of 08/11/2020 10:30  Ref. Range 03/09/2018 13:20 04/18/2020  09:09 04/29/2020 10:21 08/11/2020 09:44  Alkaline Phosphatase Latest Ref Range: 38 - 126 U/L   213 (H) 207 (H)  Albumin Latest Ref Range: 3.5 - 5.0 g/dL   3.8 4.0  AST Latest Ref Range: 15 - 41 U/L   19 76 (H)  ALT Latest Ref Range: 0 - 44 U/L   27 143 (H)  Total Protein Latest Ref Range: 6.5 - 8.1 g/dL   9.3 (H) 8.7 (H)    LABORATORY DATA:  I have reviewed the data as listed Lab Results  Component Value Date   WBC 6.7 08/11/2020   HGB 11.5 (L) 08/11/2020   HCT 34.7 (L) 08/11/2020   MCV 91.6 08/11/2020   PLT 310 08/11/2020   Recent Labs    04/18/20 0909 04/29/20 1021 08/11/20 0944  NA 135 134* 137  K 3.7 3.6 3.6  CL 104 101 105  CO2 22 25 24   GLUCOSE 109* 105* 115*  BUN 19 15 21   CREATININE 1.00 0.91 0.92  CALCIUM 8.7* 9.2 9.2  GFRNONAA 58* >60 >60  GFRAA >60 >60 >60  PROT  --  9.3* 8.7*  ALBUMIN  --  3.8 4.0  AST  --  19 76*  ALT  --  27 143*  ALKPHOS  --  213* 207*  BILITOT  --  0.6 0.6    RADIOGRAPHIC STUDIES: I have personally reviewed the radiological images as listed and agreed with the findings in the report. MR 3D Recon At Scanner  Result Date: 07/21/2020 CLINICAL DATA:  Elevated LFTs at chronic diarrhea, recent diagnosis of C difficile EXAM: MRI ABDOMEN WITHOUT AND WITH CONTRAST (INCLUDING MRCP) TECHNIQUE: Multiplanar multisequence MR imaging of the abdomen was performed both before and after the administration of intravenous contrast. Heavily T2-weighted images of the biliary and pancreatic ducts were obtained, and three-dimensional MRCP images were rendered by post processing. CONTRAST:  92mL GADAVIST GADOBUTROL 1 MMOL/ML IV SOLN COMPARISON:  CT of April 10, 2020 FINDINGS: Lower chest: No sign of effusion or consolidation. Limited assessment of the lung bases on MRI. Hepatobiliary: No focal, suspicious hepatic lesion. Irregularity of the common bile duct with alternating dilation and narrowing of the common bile duct showing of beaded appearance. Similar  appearance of intrahepatic biliary tree. No dominant intrahepatic stricture or focal, suspicious hepatic lesion. Post  cholecystectomy. Dominant area of narrowing in the extrahepatic biliary tree just proximal to the cystic duct confluence best seen on image 50 of series 10 this measures approximately 1 cm in length. Maximal caliber of the common bile duct is only 5 mm. Post contrast there is thickening of the biliary epithelium along the common bile duct best demonstrated on images 33 through 45 at the porta hepatis of the common hepatic duct Portal and hepatic veins are patent. Pancreas: Pancreatic parenchyma with normal intrinsic T1 signal. Minimal pancreatic ductal dilation though less than 4 mm. No peripancreatic inflammation. Spleen:  Spleen normal in size and contour. Adrenals/Urinary Tract: Renal glands are normal. No hydronephrosis. Symmetric renal enhancement. No suspicious renal lesion. Stomach/Bowel: No acute gastrointestinal process to the extent evaluated. Study not protocol for bowel evaluation. Vascular/Lymphatic: Vascular structures in the abdomen are patent. Mild periportal lymph node enlargement, nonspecific given other findings largest 11 mm. Other:  No ascites. Musculoskeletal: No suspicious bone lesions identified. IMPRESSION: 1. Irregularity of the common bile duct with alternating dilation and narrowing of the common bile duct showing of beaded appearance. 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 Also correlate with any history of repeated infectious cholangitis and with any other risk factors for cholangitis including IgG 4 levels. 2. Given the pattern above ERCP may be helpful as there is long segment stricture of the common bile duct on today's evaluation to exclude the possibility of neoplastic changes in the common bile duct. 3. Liver contours are mildly lobular though there is no imaging finding to support the diagnosis of cirrhosis and or fibrosis of hepatic  parenchyma at this time. 4. Mild periportal lymph node enlargement, nonspecific given other findings largest 11 mm. Electronically Signed   By: Zetta Bills M.D.   On: 07/21/2020 09:57   MR ABDOMEN MRCP W WO CONTAST  Result Date: 07/21/2020 CLINICAL DATA:  Elevated LFTs at chronic diarrhea, recent diagnosis of C difficile EXAM: MRI ABDOMEN WITHOUT AND WITH CONTRAST (INCLUDING MRCP) TECHNIQUE: Multiplanar multisequence MR imaging of the abdomen was performed both before and after the administration of intravenous contrast. Heavily T2-weighted images of the biliary and pancreatic ducts were obtained, and three-dimensional MRCP images were rendered by post processing. CONTRAST:  51mL GADAVIST GADOBUTROL 1 MMOL/ML IV SOLN COMPARISON:  CT of April 10, 2020 FINDINGS: Lower chest: No sign of effusion or consolidation. Limited assessment of the lung bases on MRI. Hepatobiliary: No focal, suspicious hepatic lesion. Irregularity of the common bile duct with alternating dilation and narrowing of the common bile duct showing of beaded appearance. Similar appearance of intrahepatic biliary tree. No dominant intrahepatic stricture or focal, suspicious hepatic lesion. Post cholecystectomy. Dominant area of narrowing in the extrahepatic biliary tree just proximal to the cystic duct confluence best seen on image 50 of series 10 this measures approximately 1 cm in length. Maximal caliber of the common bile duct is only 5 mm. Post contrast there is thickening of the biliary epithelium along the common bile duct best demonstrated on images 33 through 45 at the porta hepatis of the common hepatic duct Portal and hepatic veins are patent. Pancreas: Pancreatic parenchyma with normal intrinsic T1 signal. Minimal pancreatic ductal dilation though less than 4 mm. No peripancreatic inflammation. Spleen:  Spleen normal in size and contour. Adrenals/Urinary Tract: Renal glands are normal. No hydronephrosis. Symmetric renal enhancement. No  suspicious renal lesion. Stomach/Bowel: No acute gastrointestinal process to the extent evaluated. Study not protocol for bowel evaluation. Vascular/Lymphatic: Vascular structures in the abdomen are  patent. Mild periportal lymph node enlargement, nonspecific given other findings largest 11 mm. Other:  No ascites. Musculoskeletal: No suspicious bone lesions identified. IMPRESSION: 1. Irregularity of the common bile duct with alternating dilation and narrowing of the common bile duct showing of beaded appearance. 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 Also correlate with any history of repeated infectious cholangitis and with any other risk factors for cholangitis including IgG 4 levels. 2. Given the pattern above ERCP may be helpful as there is long segment stricture of the common bile duct on today's evaluation to exclude the possibility of neoplastic changes in the common bile duct. 3. Liver contours are mildly lobular though there is no imaging finding to support the diagnosis of cirrhosis and or fibrosis of hepatic parenchyma at this time. 4. Mild periportal lymph node enlargement, nonspecific given other findings largest 11 mm. Electronically Signed   By: Zetta Bills M.D.   On: 07/21/2020 09:57    ASSESSMENT & PLAN:   Normocytic anemia # Anemia-normocytic; between 9-10 since 2019.  Today hemoglobin is 11.5 stable; likely secondary to chronic disease/inflammation [C. difficile versus others].  No further work-up is recommended at this time.  #Right ovarian cystadenoma-s/p resection; reviewed pathology with the patient; reviewed GYN oncology notes and recommendations.  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  Defer to GI for further evaluation/recommendations.  #Chronic diarrhea-[3 years] s/p colo [Dr.Elliot]-April 2021 CT scan sigmoid colitis; currently improved.  Follow-up  with GI.  # DISPOSITION: Patient will call if any worsening anemia noted. # Follow up as needed-Dr.B  Cc; [place]   All questions were answered. The patient knows to call the clinic with any problems, questions or concerns.    Cammie Sickle, MD 08/11/2020 11:20 AM

## 2020-08-28 ENCOUNTER — Other Ambulatory Visit: Payer: Self-pay

## 2020-08-28 ENCOUNTER — Ambulatory Visit
Admission: RE | Admit: 2020-08-28 | Discharge: 2020-08-28 | Disposition: A | Discharge status: Home / Self Care | Payer: PPO | Source: Ambulatory Visit | Attending: Gastroenterology | Admitting: Gastroenterology

## 2020-08-28 DIAGNOSIS — I700 Atherosclerosis of aorta: Secondary | ICD-10-CM | POA: Diagnosis not present

## 2020-08-28 DIAGNOSIS — Z90710 Acquired absence of both cervix and uterus: Secondary | ICD-10-CM | POA: Insufficient documentation

## 2020-08-28 DIAGNOSIS — K5730 Diverticulosis of large intestine without perforation or abscess without bleeding: Secondary | ICD-10-CM | POA: Diagnosis not present

## 2020-08-28 DIAGNOSIS — Z8719 Personal history of other diseases of the digestive system: Secondary | ICD-10-CM

## 2020-08-28 LAB — POCT I-STAT CREATININE: Creatinine, Ser: 1 mg/dL (ref 0.44–1.00)

## 2020-08-28 IMAGING — CT CT ENTEROGRAPHY (ABD-PELV W/ CM)
2 of 6 series · 16 of 46 positions shown, 18 images · IV contrast (APPLIED)
Comparison: [DATE]

CLINICAL DATA: History of diverticulitis and colitis, chronic
diarrhea

EXAM:
CT ABDOMEN AND PELVIS WITH CONTRAST (ENTEROGRAPHY)
TECHNIQUE: Multidetector CT of the abdomen and pelvis during bolus
administration of intravenous contrast. Negative oral contrast was
given.
CONTRAST:  100mL OMNIPAQUE IOHEXOL 300 MG/ML  SOLN

[Series 3: entero thins · axial · 0.77mm/px · z∈[-840,-420]mm · 13 of 238 slices shown, 15 images]
[im 14/238  soft-tissue]
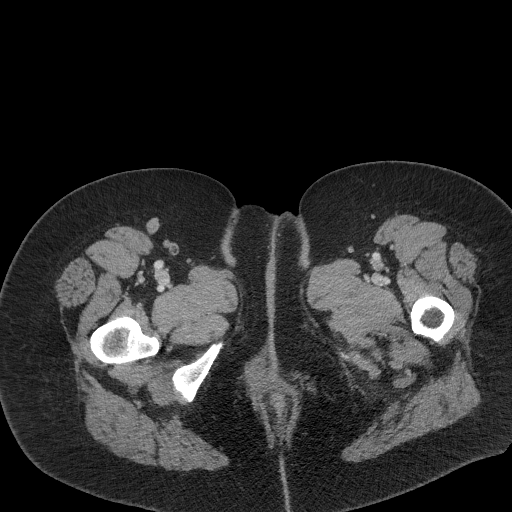
[im 14/238  bone]
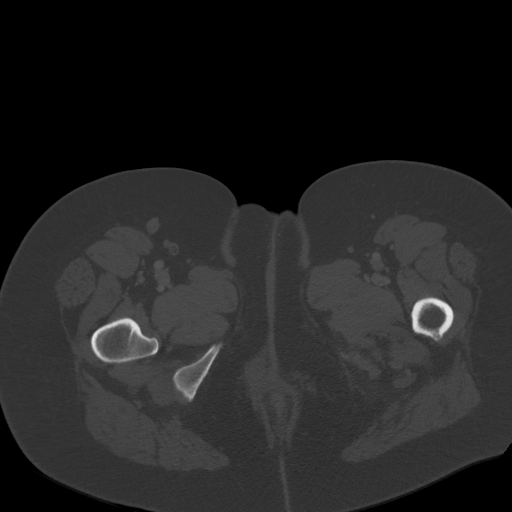
[im 27/238  soft-tissue]
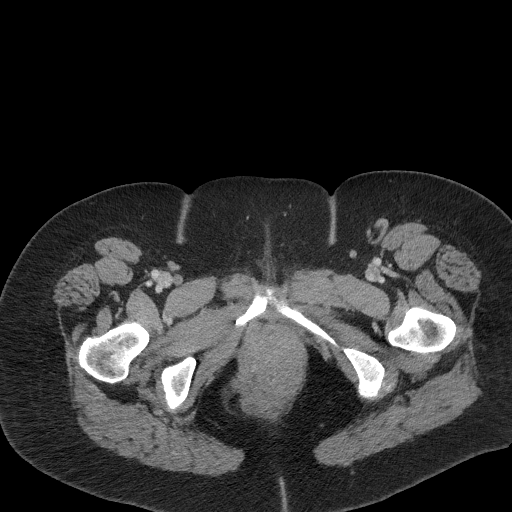
[im 53/238  soft-tissue]
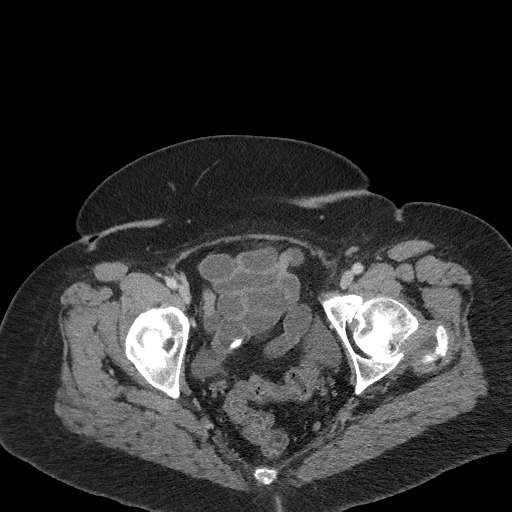
[im 66/238  soft-tissue]
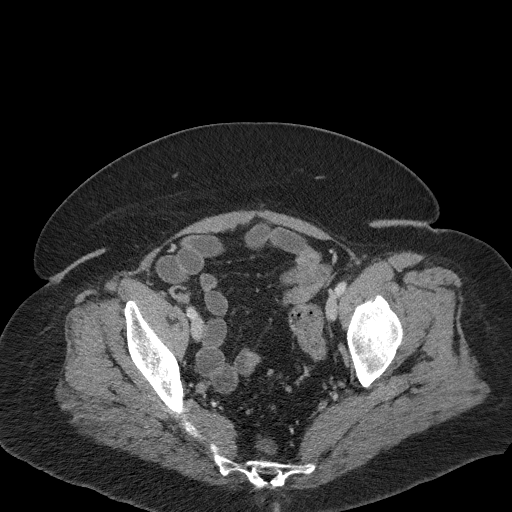
[im 80/238  soft-tissue]
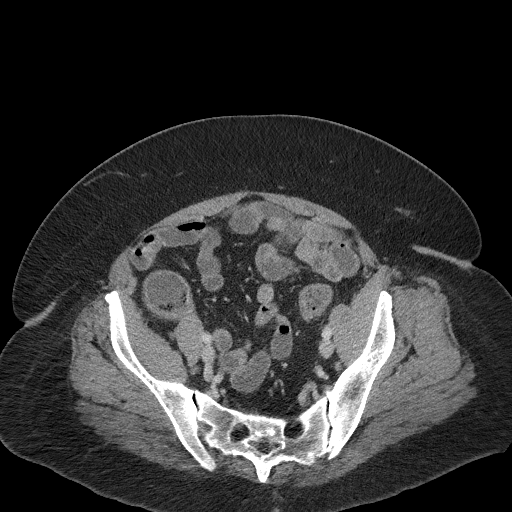
[im 106/238  soft-tissue]
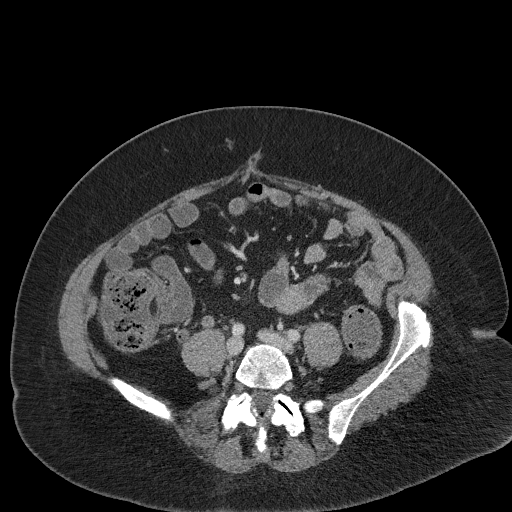
[im 119/238  soft-tissue]
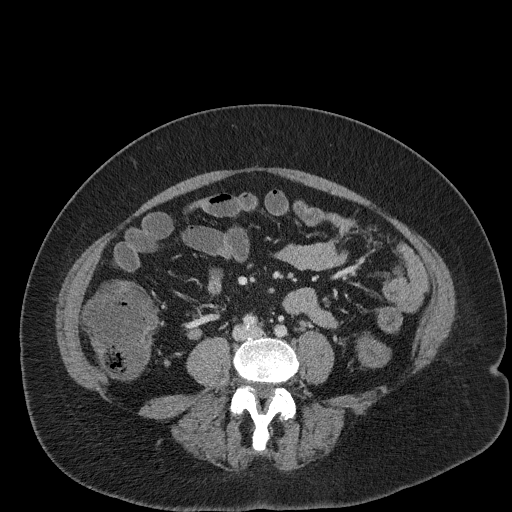
[im 132/238  soft-tissue]
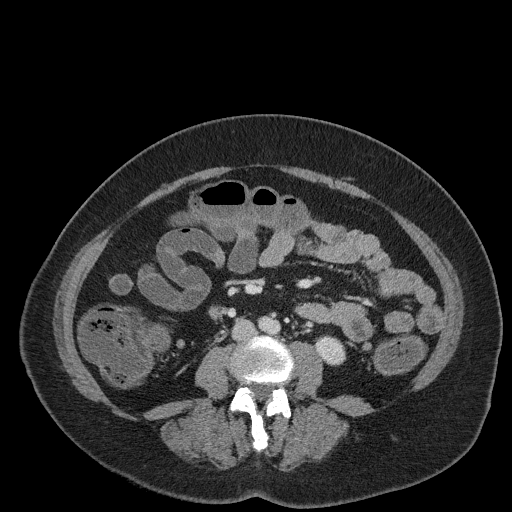
[im 159/238  soft-tissue]
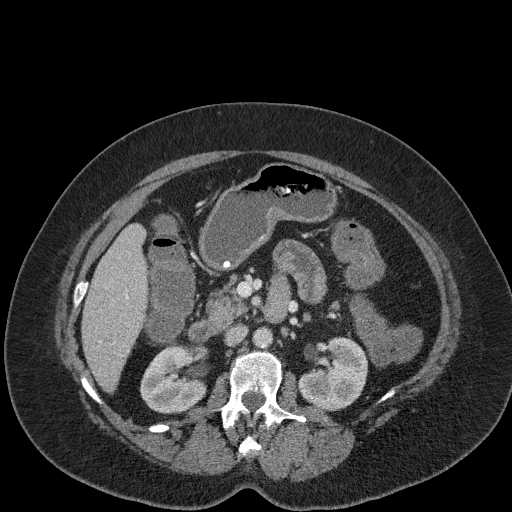
[im 159/238  bone]
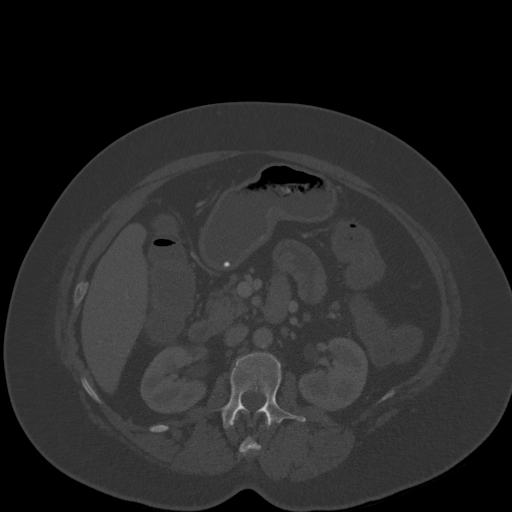
[im 172/238  soft-tissue]
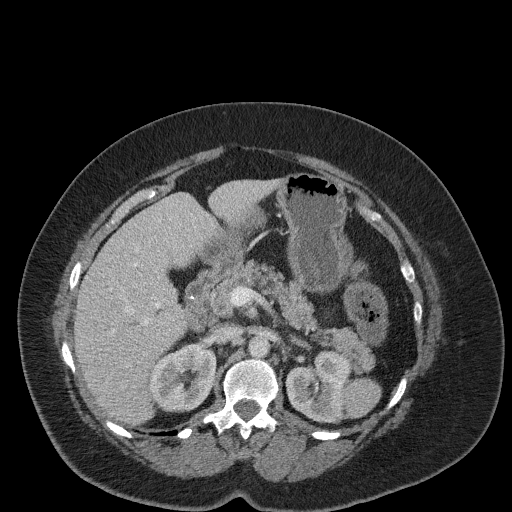
[im 185/238  soft-tissue]
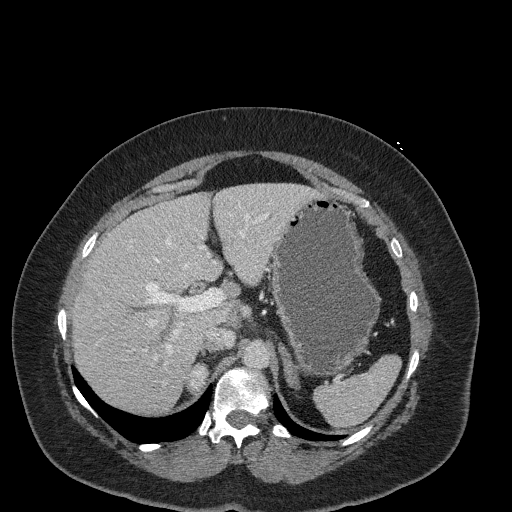
[im 211/238  soft-tissue]
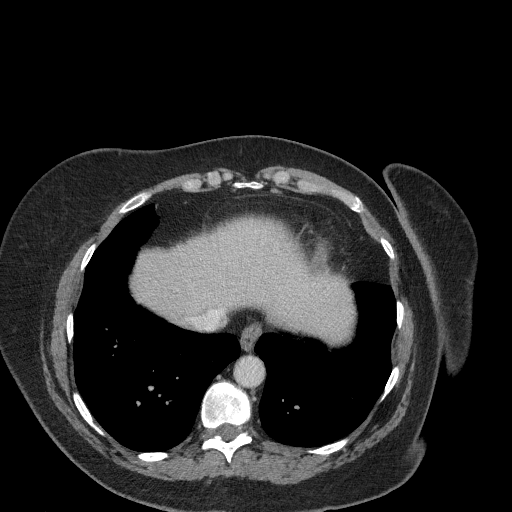
[im 224/238  soft-tissue]
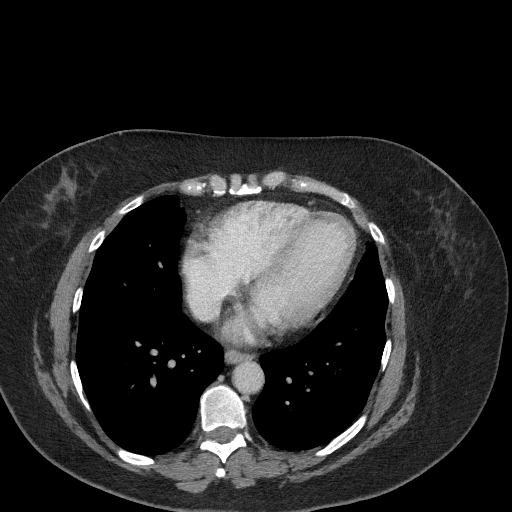

[Series 6: coronal · coronal · 0.73mm/px · 3 of 103 slices shown]
[im 35/103  soft-tissue]
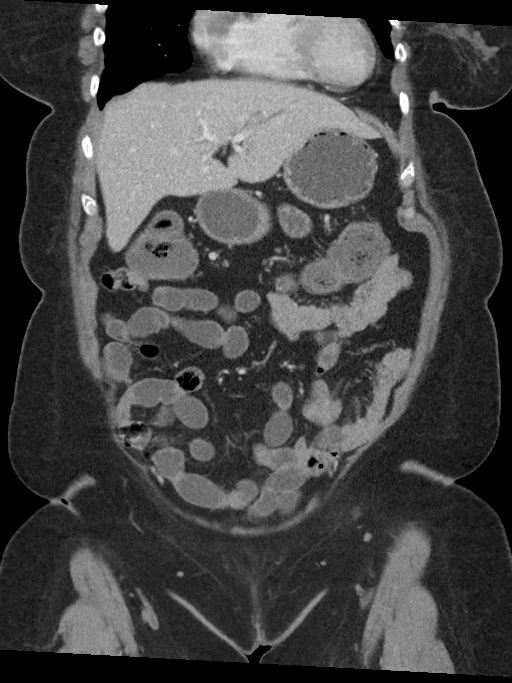
[im 46/103  soft-tissue]
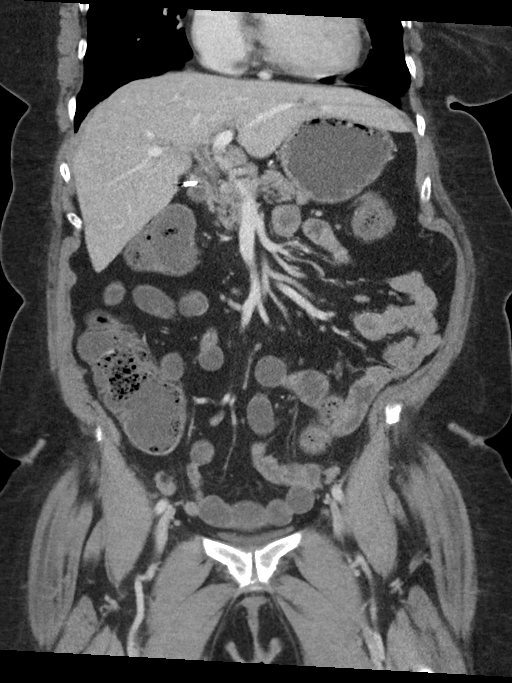
[im 57/103  soft-tissue]
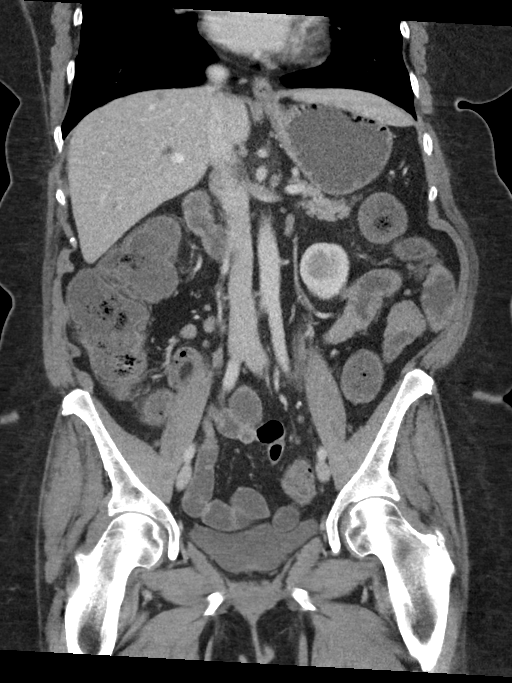

[16 of 46 positions shown; findings below may reference images not displayed]

FINDINGS: Lower chest: No acute abnormality.

Hepatobiliary: No focal liver abnormality is seen. Status post
cholecystectomy. Postoperative biliary dilatation.

Pancreas: Unremarkable. No pancreatic ductal dilatation or
surrounding inflammatory changes.

Spleen: Normal in size without significant abnormality.

Adrenals/Urinary Tract: Adrenal glands are unremarkable. Kidneys are
normal, without renal calculi, solid lesion, or hydronephrosis.
Bladder is unremarkable.

Stomach/Bowel: Stomach is within normal limits. Appendix appears
normal. No evidence of bowel wall thickening, distention, or
inflammatory changes. The colon is diffusely fluid and stool filled.
Occasional sigmoid diverticula.

Vascular/Lymphatic: Aortic atherosclerosis. Unchanged prominence of
mesenteric lymph nodes (e.g. Series 2, image 37).

Reproductive: Status post interval hysterectomy and oophorectomy.

Other: No abdominal wall hernia or abnormality. No abdominopelvic
ascites.

Musculoskeletal: No acute or significant osseous findings.
IMPRESSION: 1. No CT findings of bowel inflammation or related complication.
2. The colon is diffusely fluid and stool filled, in keeping with
diarrhea.
3. Occasional sigmoid diverticula without evidence of acute
diverticulitis.
4. Status post interval hysterectomy and oophorectomy.
5. Aortic Atherosclerosis ([GQ]-[GQ]).

## 2020-08-28 MED ORDER — IOHEXOL 300 MG/ML  SOLN
100.0000 mL | Freq: Once | INTRAMUSCULAR | Status: AC | PRN
  Administered 2020-08-28: 100 mL via INTRAVENOUS

## 2020-09-03 DIAGNOSIS — R197 Diarrhea, unspecified: Secondary | ICD-10-CM | POA: Diagnosis not present

## 2020-09-09 DIAGNOSIS — K838 Other specified diseases of biliary tract: Secondary | ICD-10-CM | POA: Diagnosis not present

## 2020-09-09 DIAGNOSIS — Z6835 Body mass index (BMI) 35.0-35.9, adult: Secondary | ICD-10-CM | POA: Diagnosis not present

## 2020-09-09 DIAGNOSIS — D649 Anemia, unspecified: Secondary | ICD-10-CM | POA: Diagnosis not present

## 2020-09-09 DIAGNOSIS — Z79899 Other long term (current) drug therapy: Secondary | ICD-10-CM | POA: Diagnosis not present

## 2020-09-09 DIAGNOSIS — I10 Essential (primary) hypertension: Secondary | ICD-10-CM | POA: Diagnosis not present

## 2020-09-09 DIAGNOSIS — Z9049 Acquired absence of other specified parts of digestive tract: Secondary | ICD-10-CM | POA: Diagnosis not present

## 2020-09-09 DIAGNOSIS — Z7982 Long term (current) use of aspirin: Secondary | ICD-10-CM | POA: Diagnosis not present

## 2020-09-09 DIAGNOSIS — R7989 Other specified abnormal findings of blood chemistry: Secondary | ICD-10-CM | POA: Diagnosis not present

## 2020-09-09 DIAGNOSIS — K8050 Calculus of bile duct without cholangitis or cholecystitis without obstruction: Secondary | ICD-10-CM | POA: Diagnosis not present

## 2020-09-09 DIAGNOSIS — E669 Obesity, unspecified: Secondary | ICD-10-CM | POA: Diagnosis not present

## 2020-09-09 DIAGNOSIS — R932 Abnormal findings on diagnostic imaging of liver and biliary tract: Secondary | ICD-10-CM | POA: Diagnosis not present

## 2020-09-09 DIAGNOSIS — K5010 Crohn's disease of large intestine without complications: Secondary | ICD-10-CM | POA: Diagnosis not present

## 2020-09-09 DIAGNOSIS — E785 Hyperlipidemia, unspecified: Secondary | ICD-10-CM | POA: Diagnosis not present

## 2020-09-09 DIAGNOSIS — R748 Abnormal levels of other serum enzymes: Secondary | ICD-10-CM | POA: Diagnosis not present

## 2020-09-09 DIAGNOSIS — K8301 Primary sclerosing cholangitis: Secondary | ICD-10-CM | POA: Diagnosis not present

## 2020-09-09 DIAGNOSIS — K831 Obstruction of bile duct: Secondary | ICD-10-CM | POA: Diagnosis not present

## 2020-09-22 ENCOUNTER — Ambulatory Visit: Payer: PPO | Attending: Internal Medicine

## 2020-09-22 DIAGNOSIS — Z23 Encounter for immunization: Secondary | ICD-10-CM

## 2020-09-22 NOTE — Progress Notes (Signed)
   Covid-19 Vaccination Clinic  Name:  Christy Summers    MRN: [id number] DOB: 1950-12-25  09/22/2020  [name] was observed post Covid-19 immunization for 15 minutes without incident. She was provided with Vaccine Information Sheet and instruction to access the V-Safe system.   [name] was instructed to call 911 with any severe reactions post vaccine: Marland Kitchen Difficulty breathing  . Swelling of face and throat  . A fast heartbeat  . A bad rash all over body  . Dizziness and weakness

## 2020-10-08 DIAGNOSIS — R197 Diarrhea, unspecified: Secondary | ICD-10-CM | POA: Diagnosis not present

## 2020-10-08 DIAGNOSIS — K8301 Primary sclerosing cholangitis: Secondary | ICD-10-CM | POA: Diagnosis not present

## 2020-10-09 DIAGNOSIS — R197 Diarrhea, unspecified: Secondary | ICD-10-CM | POA: Diagnosis not present

## 2020-10-28 DIAGNOSIS — Z90710 Acquired absence of both cervix and uterus: Secondary | ICD-10-CM | POA: Diagnosis not present

## 2020-10-28 DIAGNOSIS — D649 Anemia, unspecified: Secondary | ICD-10-CM | POA: Diagnosis not present

## 2020-10-28 DIAGNOSIS — Z79899 Other long term (current) drug therapy: Secondary | ICD-10-CM | POA: Diagnosis not present

## 2020-10-28 DIAGNOSIS — E7800 Pure hypercholesterolemia, unspecified: Secondary | ICD-10-CM | POA: Diagnosis not present

## 2020-10-28 DIAGNOSIS — R7309 Other abnormal glucose: Secondary | ICD-10-CM | POA: Diagnosis not present

## 2020-10-28 DIAGNOSIS — E8809 Other disorders of plasma-protein metabolism, not elsewhere classified: Secondary | ICD-10-CM | POA: Diagnosis not present

## 2020-10-28 DIAGNOSIS — Z6834 Body mass index (BMI) 34.0-34.9, adult: Secondary | ICD-10-CM | POA: Diagnosis not present

## 2020-10-28 DIAGNOSIS — R945 Abnormal results of liver function studies: Secondary | ICD-10-CM | POA: Diagnosis not present

## 2020-10-28 DIAGNOSIS — I10 Essential (primary) hypertension: Secondary | ICD-10-CM | POA: Diagnosis not present

## 2020-10-28 DIAGNOSIS — Z8619 Personal history of other infectious and parasitic diseases: Secondary | ICD-10-CM | POA: Diagnosis not present

## 2020-11-04 DIAGNOSIS — R195 Other fecal abnormalities: Secondary | ICD-10-CM | POA: Diagnosis not present

## 2020-11-04 DIAGNOSIS — Z0000 Encounter for general adult medical examination without abnormal findings: Secondary | ICD-10-CM | POA: Diagnosis not present

## 2020-11-04 DIAGNOSIS — I10 Essential (primary) hypertension: Secondary | ICD-10-CM | POA: Diagnosis not present

## 2020-11-04 DIAGNOSIS — R945 Abnormal results of liver function studies: Secondary | ICD-10-CM | POA: Diagnosis not present

## 2020-11-04 DIAGNOSIS — D649 Anemia, unspecified: Secondary | ICD-10-CM | POA: Diagnosis not present

## 2020-11-04 DIAGNOSIS — K8301 Primary sclerosing cholangitis: Secondary | ICD-10-CM | POA: Diagnosis not present

## 2020-11-04 DIAGNOSIS — R7309 Other abnormal glucose: Secondary | ICD-10-CM | POA: Diagnosis not present

## 2020-11-04 DIAGNOSIS — K580 Irritable bowel syndrome with diarrhea: Secondary | ICD-10-CM | POA: Diagnosis not present

## 2020-11-04 DIAGNOSIS — Z23 Encounter for immunization: Secondary | ICD-10-CM | POA: Diagnosis not present

## 2020-11-04 DIAGNOSIS — Z6836 Body mass index (BMI) 36.0-36.9, adult: Secondary | ICD-10-CM | POA: Diagnosis not present

## 2020-11-04 DIAGNOSIS — E8809 Other disorders of plasma-protein metabolism, not elsewhere classified: Secondary | ICD-10-CM | POA: Diagnosis not present

## 2021-01-07 DIAGNOSIS — K8301 Primary sclerosing cholangitis: Secondary | ICD-10-CM | POA: Diagnosis not present

## 2021-02-25 DIAGNOSIS — D649 Anemia, unspecified: Secondary | ICD-10-CM | POA: Diagnosis not present

## 2021-02-25 DIAGNOSIS — R195 Other fecal abnormalities: Secondary | ICD-10-CM | POA: Diagnosis not present

## 2021-02-25 DIAGNOSIS — Z0000 Encounter for general adult medical examination without abnormal findings: Secondary | ICD-10-CM | POA: Diagnosis not present

## 2021-02-25 DIAGNOSIS — Z6836 Body mass index (BMI) 36.0-36.9, adult: Secondary | ICD-10-CM | POA: Diagnosis not present

## 2021-02-25 DIAGNOSIS — R945 Abnormal results of liver function studies: Secondary | ICD-10-CM | POA: Diagnosis not present

## 2021-02-25 DIAGNOSIS — R7309 Other abnormal glucose: Secondary | ICD-10-CM | POA: Diagnosis not present

## 2021-02-25 DIAGNOSIS — I10 Essential (primary) hypertension: Secondary | ICD-10-CM | POA: Diagnosis not present

## 2021-02-25 DIAGNOSIS — K580 Irritable bowel syndrome with diarrhea: Secondary | ICD-10-CM | POA: Diagnosis not present

## 2021-02-25 DIAGNOSIS — E8809 Other disorders of plasma-protein metabolism, not elsewhere classified: Secondary | ICD-10-CM | POA: Diagnosis not present

## 2021-02-25 DIAGNOSIS — K8301 Primary sclerosing cholangitis: Secondary | ICD-10-CM | POA: Diagnosis not present

## 2021-03-04 DIAGNOSIS — I10 Essential (primary) hypertension: Secondary | ICD-10-CM | POA: Diagnosis not present

## 2021-03-04 DIAGNOSIS — R195 Other fecal abnormalities: Secondary | ICD-10-CM | POA: Diagnosis not present

## 2021-03-04 DIAGNOSIS — K8301 Primary sclerosing cholangitis: Secondary | ICD-10-CM | POA: Diagnosis not present

## 2021-03-04 DIAGNOSIS — E8809 Other disorders of plasma-protein metabolism, not elsewhere classified: Secondary | ICD-10-CM | POA: Diagnosis not present

## 2021-03-04 DIAGNOSIS — D649 Anemia, unspecified: Secondary | ICD-10-CM | POA: Diagnosis not present

## 2021-03-04 DIAGNOSIS — Z0000 Encounter for general adult medical examination without abnormal findings: Secondary | ICD-10-CM | POA: Diagnosis not present

## 2021-03-04 DIAGNOSIS — Z6836 Body mass index (BMI) 36.0-36.9, adult: Secondary | ICD-10-CM | POA: Diagnosis not present

## 2021-03-04 DIAGNOSIS — E7800 Pure hypercholesterolemia, unspecified: Secondary | ICD-10-CM | POA: Diagnosis not present

## 2021-03-27 DIAGNOSIS — K529 Noninfective gastroenteritis and colitis, unspecified: Secondary | ICD-10-CM | POA: Diagnosis not present

## 2021-04-10 DIAGNOSIS — K529 Noninfective gastroenteritis and colitis, unspecified: Secondary | ICD-10-CM | POA: Diagnosis not present

## 2021-05-05 ENCOUNTER — Other Ambulatory Visit: Payer: Self-pay | Admitting: Internal Medicine

## 2021-05-05 DIAGNOSIS — Z1231 Encounter for screening mammogram for malignant neoplasm of breast: Secondary | ICD-10-CM

## 2021-05-12 DIAGNOSIS — K529 Noninfective gastroenteritis and colitis, unspecified: Secondary | ICD-10-CM | POA: Diagnosis not present

## 2021-05-20 DIAGNOSIS — K5080 Crohn's disease of both small and large intestine without complications: Secondary | ICD-10-CM | POA: Diagnosis not present

## 2021-05-20 DIAGNOSIS — D649 Anemia, unspecified: Secondary | ICD-10-CM | POA: Diagnosis not present

## 2021-06-23 ENCOUNTER — Ambulatory Visit
Admission: RE | Admit: 2021-06-23 | Discharge: 2021-06-23 | Disposition: A | Discharge status: Home / Self Care | Payer: PPO | Source: Ambulatory Visit | Attending: Internal Medicine | Admitting: Internal Medicine

## 2021-06-23 ENCOUNTER — Other Ambulatory Visit: Payer: Self-pay

## 2021-06-23 DIAGNOSIS — Z1231 Encounter for screening mammogram for malignant neoplasm of breast: Secondary | ICD-10-CM | POA: Insufficient documentation

## 2021-06-23 IMAGING — MG MM DIGITAL SCREENING BILAT W/ TOMO AND CAD
6 of 10 series · 6 of 30 positions shown · non-contrast
Comparison: Previous exam(s).

CLINICAL DATA: Screening.

EXAM:
DIGITAL SCREENING BILATERAL MAMMOGRAM WITH TOMOSYNTHESIS AND CAD
TECHNIQUE: Bilateral screening digital craniocaudal and mediolateral oblique
mammograms were obtained. Bilateral screening digital breast
tomosynthesis was performed. The images were evaluated with
computer-aided detection.

[R CC synth-2D]
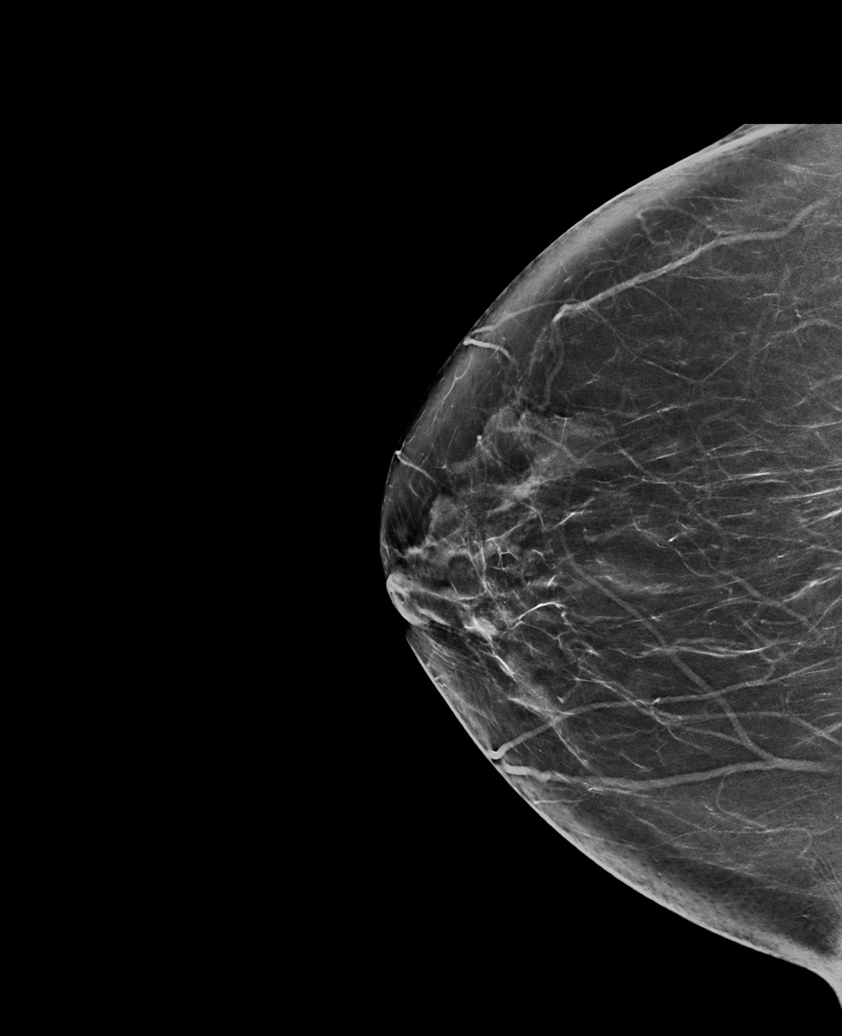

[L MLO synth-2D (1 of 2)]
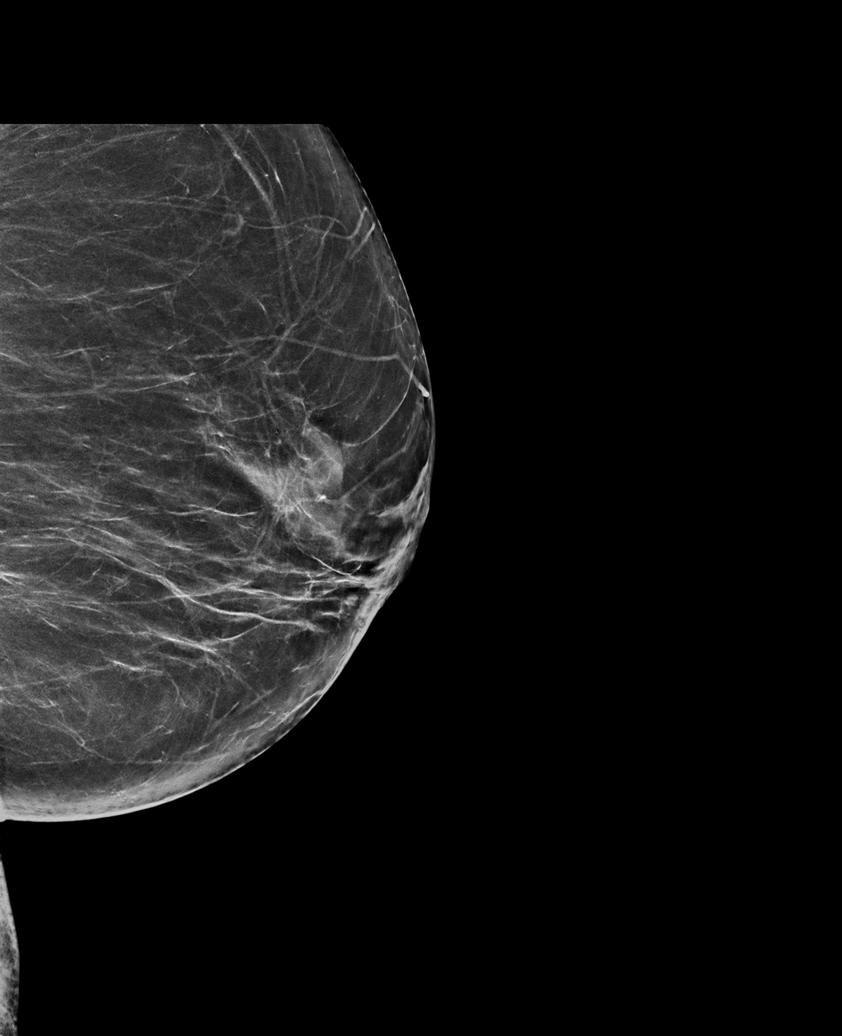

[L MLO synth-2D (2 of 2)]
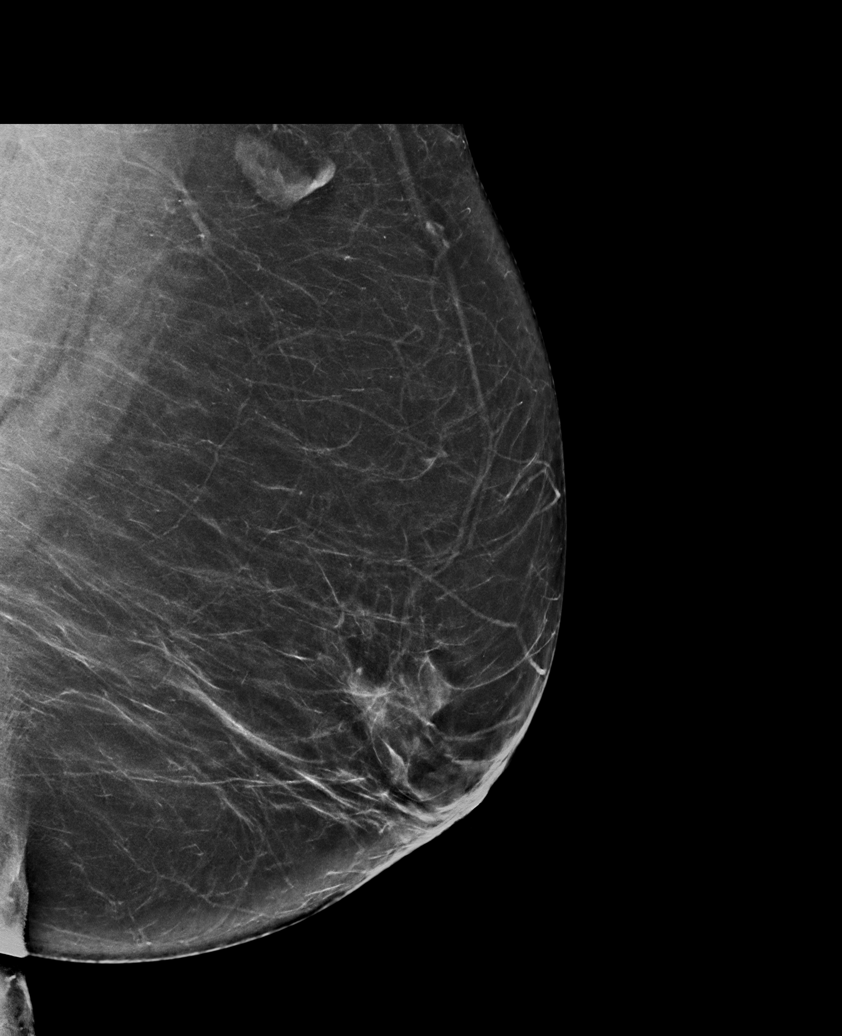

[L CC synth-2D]
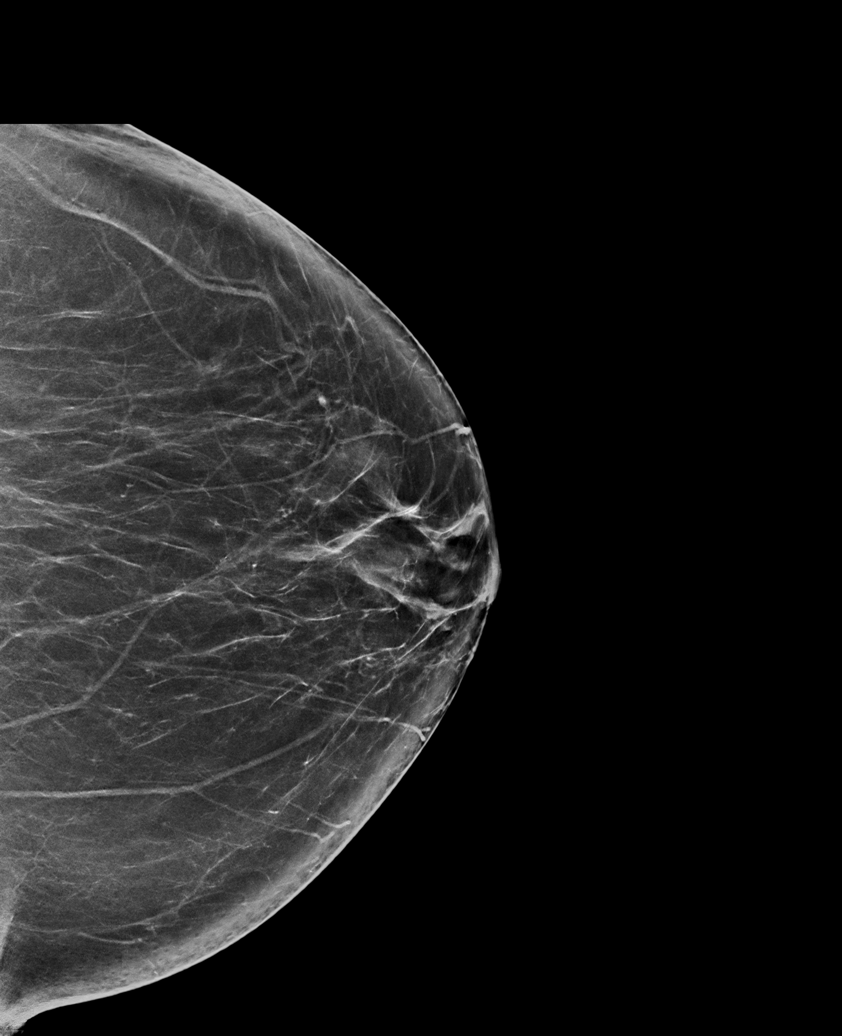

[R MLO synth-2D]
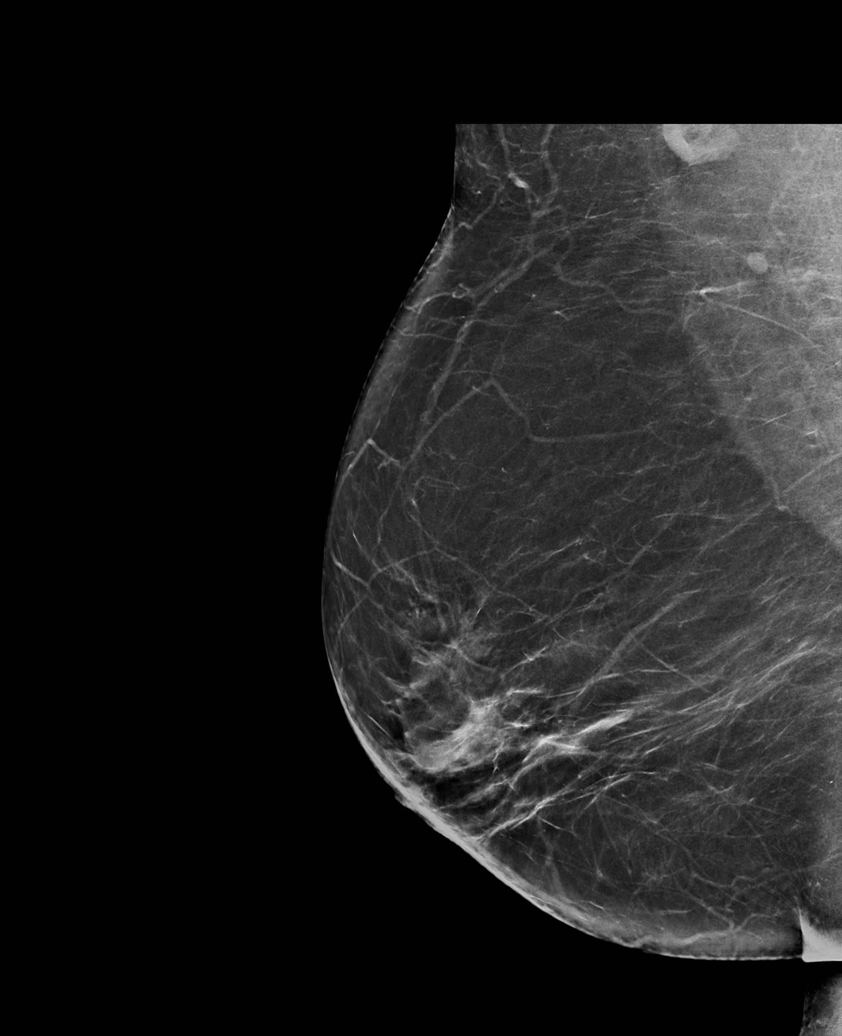

[L MLO tomo · tomo slice 41/81.0]
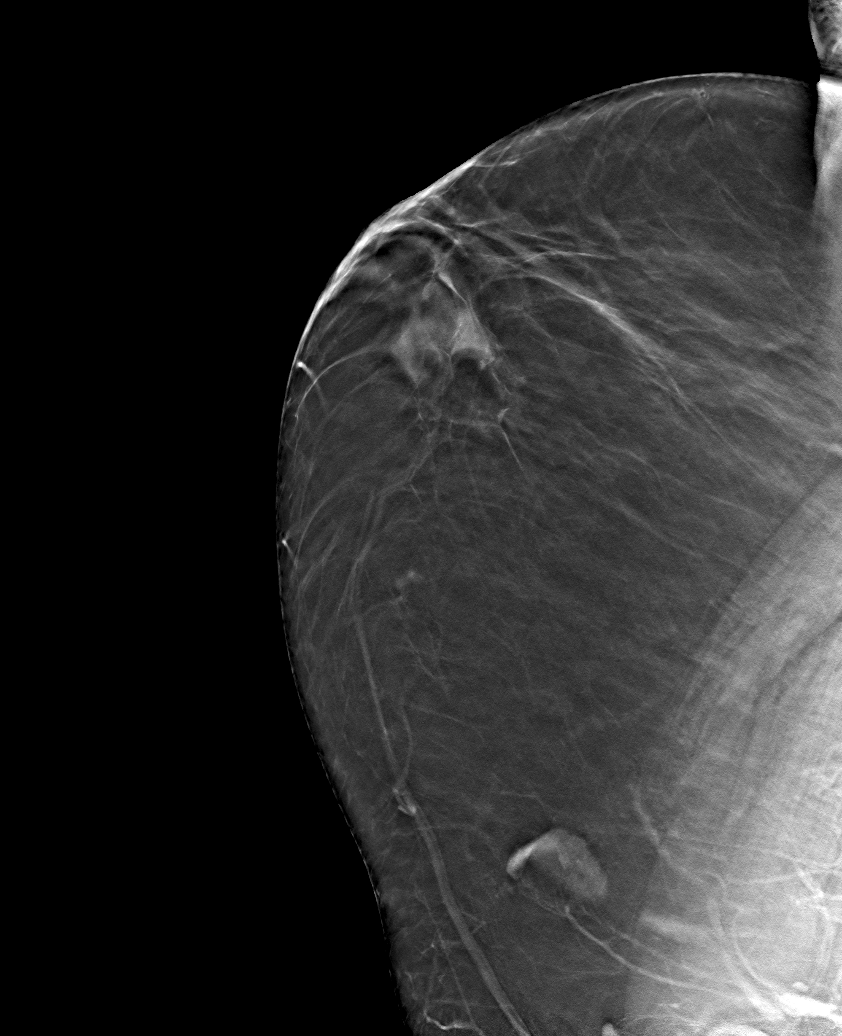

[6 of 30 positions shown; findings below may reference images not displayed]

ACR Breast Density Category b: There are scattered areas of
fibroglandular density.
FINDINGS: There are no findings suspicious for malignancy.
IMPRESSION: No mammographic evidence of malignancy. A result letter of this
screening mammogram will be mailed directly to the patient.

RECOMMENDATION:
Screening mammogram in one year. (Code:[BY])

BI-RADS CATEGORY  1: Negative.

## 2021-06-30 DIAGNOSIS — K8301 Primary sclerosing cholangitis: Secondary | ICD-10-CM | POA: Diagnosis not present

## 2021-06-30 DIAGNOSIS — E7800 Pure hypercholesterolemia, unspecified: Secondary | ICD-10-CM | POA: Diagnosis not present

## 2021-06-30 DIAGNOSIS — E8809 Other disorders of plasma-protein metabolism, not elsewhere classified: Secondary | ICD-10-CM | POA: Diagnosis not present

## 2021-06-30 DIAGNOSIS — D649 Anemia, unspecified: Secondary | ICD-10-CM | POA: Diagnosis not present

## 2021-06-30 DIAGNOSIS — Z6836 Body mass index (BMI) 36.0-36.9, adult: Secondary | ICD-10-CM | POA: Diagnosis not present

## 2021-06-30 DIAGNOSIS — I10 Essential (primary) hypertension: Secondary | ICD-10-CM | POA: Diagnosis not present

## 2021-06-30 DIAGNOSIS — K529 Noninfective gastroenteritis and colitis, unspecified: Secondary | ICD-10-CM | POA: Diagnosis not present

## 2021-06-30 DIAGNOSIS — R195 Other fecal abnormalities: Secondary | ICD-10-CM | POA: Diagnosis not present

## 2021-07-03 DIAGNOSIS — K5080 Crohn's disease of both small and large intestine without complications: Secondary | ICD-10-CM | POA: Diagnosis not present

## 2021-07-07 DIAGNOSIS — E8809 Other disorders of plasma-protein metabolism, not elsewhere classified: Secondary | ICD-10-CM | POA: Diagnosis not present

## 2021-07-07 DIAGNOSIS — R7989 Other specified abnormal findings of blood chemistry: Secondary | ICD-10-CM | POA: Diagnosis not present

## 2021-07-07 DIAGNOSIS — I10 Essential (primary) hypertension: Secondary | ICD-10-CM | POA: Diagnosis not present

## 2021-07-07 DIAGNOSIS — Z8619 Personal history of other infectious and parasitic diseases: Secondary | ICD-10-CM | POA: Diagnosis not present

## 2021-07-07 DIAGNOSIS — K8301 Primary sclerosing cholangitis: Secondary | ICD-10-CM | POA: Diagnosis not present

## 2021-07-07 DIAGNOSIS — R195 Other fecal abnormalities: Secondary | ICD-10-CM | POA: Diagnosis not present

## 2021-07-07 DIAGNOSIS — D649 Anemia, unspecified: Secondary | ICD-10-CM | POA: Diagnosis not present

## 2021-07-07 DIAGNOSIS — K50919 Crohn's disease, unspecified, with unspecified complications: Secondary | ICD-10-CM | POA: Diagnosis not present

## 2021-07-07 DIAGNOSIS — E7800 Pure hypercholesterolemia, unspecified: Secondary | ICD-10-CM | POA: Diagnosis not present

## 2021-07-07 DIAGNOSIS — B079 Viral wart, unspecified: Secondary | ICD-10-CM | POA: Diagnosis not present

## 2021-07-07 DIAGNOSIS — Z23 Encounter for immunization: Secondary | ICD-10-CM | POA: Diagnosis not present

## 2021-08-28 DIAGNOSIS — K5080 Crohn's disease of both small and large intestine without complications: Secondary | ICD-10-CM | POA: Diagnosis not present

## 2021-09-16 ENCOUNTER — Ambulatory Visit (INDEPENDENT_AMBULATORY_CARE_PROVIDER_SITE_OTHER): Payer: PPO | Admitting: Dermatology

## 2021-09-16 ENCOUNTER — Other Ambulatory Visit: Payer: Self-pay

## 2021-09-16 DIAGNOSIS — D485 Neoplasm of uncertain behavior of skin: Secondary | ICD-10-CM

## 2021-09-16 DIAGNOSIS — L821 Other seborrheic keratosis: Secondary | ICD-10-CM

## 2021-09-16 DIAGNOSIS — B078 Other viral warts: Secondary | ICD-10-CM

## 2021-09-16 NOTE — Patient Instructions (Signed)
Wound Care Instructions  Cleanse wound gently with soap and water once a day then pat dry with clean gauze. Apply a thing coat of Petrolatum (petroleum jelly, "Vaseline") over the wound (unless you have an allergy to this). We recommend that you use a new, sterile tube of Vaseline. Do not pick or remove scabs. Do not remove the yellow or white "healing tissue" from the base of the wound.  Cover the wound with fresh, clean, nonstick gauze and secure with paper tape. You may use Band-Aids in place of gauze and tape if the would is small enough, but would recommend trimming much of the tape off as there is often too much. Sometimes Band-Aids can irritate the skin.  You should call the office for your biopsy report after 1 week if you have not already been contacted.  If you experience any problems, such as abnormal amounts of bleeding, swelling, significant bruising, significant pain, or evidence of infection, please call the office immediately.  FOR ADULT SURGERY PATIENTS: If you need something for pain relief you may take 1 extra strength Tylenol (acetaminophen) AND 2 Ibuprofen (200mg each) together every 4 hours as needed for pain. (do not take these if you are allergic to them or if you have a reason you should not take them.) Typically, you may only need pain medication for 1 to 3 days.   If you have any questions or concerns for your doctor, please call our main line at [phone] and press option 4 to reach your doctor's medical assistant. If no one answers, please leave a voicemail as directed and we will return your call as soon as possible. Messages left after 4 pm will be answered the following business day.   You may also send us a message via MyChart. We typically respond to MyChart messages within 1-2 business days.  For prescription refills, please ask your pharmacy to contact our office. Our fax number is [phone].  If you have an urgent issue when the clinic is closed that  cannot wait until the next business day, you can page your doctor at the number below.    Please note that while we do our best to be available for urgent issues outside of office hours, we are not available 24/7.   If you have an urgent issue and are unable to reach us, you may choose to seek medical care at your doctor's office, retail clinic, urgent care center, or emergency room.  If you have a medical emergency, please immediately call 911 or go to the emergency department.  Pager Numbers  - Dr. Kowalski: [phone]  - Dr. Moye: [phone]  - Dr. Stewart: [phone]  In the event of inclement weather, please call our main line at [phone] for an update on the status of any delays or closures.  Dermatology Medication Tips: Please keep the boxes that topical medications come in in order to help keep track of the instructions about where and how to use these. Pharmacies typically print the medication instructions only on the boxes and not directly on the medication tubes.   If your medication is too expensive, please contact our office at [phone] option 4 or send us a message through MyChart.   We are unable to tell what your co-pay for medications will be in advance as this is different depending on your insurance coverage. However, we may be able to find a substitute medication at lower cost or fill out paperwork to get insurance to cover a needed   medication.   If a prior authorization is required to get your medication covered by your insurance company, please allow us 1-2 business days to complete this process.  Drug prices often vary depending on where the prescription is filled and some pharmacies may offer cheaper prices.  The website www.goodrx.com contains coupons for medications through different pharmacies. The prices here do not account for what the cost may be with help from insurance (it may be cheaper with your insurance), but the website can give you the  price if you did not use any insurance.  - You can print the associated coupon and take it with your prescription to the pharmacy.  - You may also stop by our office during regular business hours and pick up a GoodRx coupon card.  - If you need your prescription sent electronically to a different pharmacy, notify our office through [facility] MyChart or by phone at [phone] option 4.   

## 2021-09-16 NOTE — Progress Notes (Signed)
   New Patient Visit  Subjective  Christy Summers is a 70 y.o. female who presents for the following: Other (Spot of left ear gets irritated by hair). Referred by Dr. Ginette Pitman  The following portions of the chart were reviewed this encounter and updated as appropriate:   Tobacco  Allergies  Meds  Problems  Med Hx  Surg Hx  Fam Hx     Review of Systems:  No other skin or systemic complaints except as noted in HPI or Assessment and Plan.  Objective  Well appearing patient in no apparent distress; mood and affect are within normal limits.  A focused examination was performed including left ear. Relevant physical exam findings are noted in the Assessment and Plan.  Left Tragus 0.6 cm hyperkeratotic papule   Assessment & Plan  Neoplasm of uncertain behavior of skin Left Tragus  Epidermal / dermal shaving  Lesion diameter (cm):  0.6 Informed consent: discussed and consent obtained   Timeout: patient name, date of birth, surgical site, and procedure verified   Procedure prep:  Patient was prepped and draped in usual sterile fashion Prep type:  Isopropyl alcohol Anesthesia: the lesion was anesthetized in a standard fashion   Anesthetic:  1% lidocaine w/ epinephrine 1-100,000 buffered w/ 8.4% NaHCO3 Instrument used: flexible razor blade   Hemostasis achieved with: pressure, aluminum chloride and electrodesiccation   Outcome: patient tolerated procedure well   Post-procedure details: sterile dressing applied and wound care instructions given   Dressing type: bandage and petrolatum    Specimen 1 - Surgical pathology Differential Diagnosis: Inflamed SK vs Wart vs other  Check Margins: No  Discussed risk of keloid  Seborrheic Keratoses - face - Stuck-on, waxy, tan-brown papules and/or plaques  - Benign-appearing - Discussed benign etiology and prognosis. - Observe - Call for any changes  Return if symptoms worsen or fail to improve.  I, Ashok Cordia, CMA, am acting as  scribe for Sarina Ser, MD .  Documentation: I have reviewed the above documentation for accuracy and completeness, and I agree with the above.  Sarina Ser, MD

## 2021-09-17 ENCOUNTER — Encounter: Payer: Self-pay | Admitting: Dermatology

## 2021-09-18 ENCOUNTER — Telehealth: Payer: Self-pay

## 2021-09-18 ENCOUNTER — Encounter: Payer: Self-pay | Admitting: *Deleted

## 2021-09-18 NOTE — Telephone Encounter (Signed)
Patient informed of pathology results 

## 2021-09-18 NOTE — Telephone Encounter (Signed)
-----   Message from Ralene Bathe, MD sent at 09/17/2021  5:52 PM EDT ----- Diagnosis Skin , left tragus VERRUCA VULGARIS, INFLAMED  Benign viral wart May recur No further treatment at this time

## 2021-09-21 ENCOUNTER — Ambulatory Visit: Payer: PPO | Admitting: Anesthesiology

## 2021-09-21 ENCOUNTER — Encounter
Admission: RE | Disposition: A | Discharge status: Home / Self Care | Payer: Self-pay | Source: Home / Self Care | Attending: Gastroenterology

## 2021-09-21 ENCOUNTER — Encounter: Payer: Self-pay | Admitting: *Deleted

## 2021-09-21 ENCOUNTER — Ambulatory Visit
Admission: RE | Admit: 2021-09-21 | Discharge: 2021-09-21 | Disposition: A | Discharge status: Home / Self Care | Payer: PPO | Attending: Gastroenterology | Admitting: Gastroenterology

## 2021-09-21 DIAGNOSIS — Z79899 Other long term (current) drug therapy: Secondary | ICD-10-CM | POA: Diagnosis not present

## 2021-09-21 DIAGNOSIS — Z1211 Encounter for screening for malignant neoplasm of colon: Secondary | ICD-10-CM | POA: Diagnosis not present

## 2021-09-21 DIAGNOSIS — Z6834 Body mass index (BMI) 34.0-34.9, adult: Secondary | ICD-10-CM | POA: Diagnosis not present

## 2021-09-21 DIAGNOSIS — K640 First degree hemorrhoids: Secondary | ICD-10-CM | POA: Insufficient documentation

## 2021-09-21 DIAGNOSIS — K50818 Crohn's disease of both small and large intestine with other complication: Secondary | ICD-10-CM | POA: Diagnosis not present

## 2021-09-21 DIAGNOSIS — Z888 Allergy status to other drugs, medicaments and biological substances status: Secondary | ICD-10-CM | POA: Diagnosis not present

## 2021-09-21 DIAGNOSIS — I10 Essential (primary) hypertension: Secondary | ICD-10-CM | POA: Diagnosis not present

## 2021-09-21 DIAGNOSIS — K5080 Crohn's disease of both small and large intestine without complications: Secondary | ICD-10-CM | POA: Insufficient documentation

## 2021-09-21 DIAGNOSIS — R7303 Prediabetes: Secondary | ICD-10-CM | POA: Diagnosis not present

## 2021-09-21 DIAGNOSIS — Z7982 Long term (current) use of aspirin: Secondary | ICD-10-CM | POA: Insufficient documentation

## 2021-09-21 DIAGNOSIS — E669 Obesity, unspecified: Secondary | ICD-10-CM | POA: Insufficient documentation

## 2021-09-21 DIAGNOSIS — K5090 Crohn's disease, unspecified, without complications: Secondary | ICD-10-CM | POA: Diagnosis not present

## 2021-09-21 DIAGNOSIS — K52838 Other microscopic colitis: Secondary | ICD-10-CM | POA: Diagnosis not present

## 2021-09-21 HISTORY — PX: COLONOSCOPY WITH PROPOFOL: SHX5780

## 2021-09-21 SURGERY — COLONOSCOPY WITH PROPOFOL
Anesthesia: General

## 2021-09-21 MED ORDER — MIDAZOLAM HCL 2 MG/2ML IJ SOLN
INTRAMUSCULAR | Status: DC | PRN
  Administered 2021-09-21: 2 mg via INTRAVENOUS

## 2021-09-21 MED ORDER — MIDAZOLAM HCL 2 MG/2ML IJ SOLN
INTRAMUSCULAR | Status: AC
  Filled 2021-09-21: qty 2

## 2021-09-21 MED ORDER — PROPOFOL 10 MG/ML IV BOLUS
INTRAVENOUS | Status: DC | PRN
  Administered 2021-09-21: 30 mg via INTRAVENOUS

## 2021-09-21 MED ORDER — FENTANYL CITRATE (PF) 100 MCG/2ML IJ SOLN
INTRAMUSCULAR | Status: DC | PRN
  Administered 2021-09-21: 25 ug via INTRAVENOUS

## 2021-09-21 MED ORDER — LIDOCAINE HCL (CARDIAC) PF 100 MG/5ML IV SOSY
PREFILLED_SYRINGE | INTRAVENOUS | Status: DC | PRN
  Administered 2021-09-21: 50 mg via INTRAVENOUS

## 2021-09-21 MED ORDER — PROPOFOL 500 MG/50ML IV EMUL
INTRAVENOUS | Status: AC
  Filled 2021-09-21: qty 50

## 2021-09-21 MED ORDER — PROPOFOL 500 MG/50ML IV EMUL
INTRAVENOUS | Status: DC | PRN
  Administered 2021-09-21: 50 ug/kg/min via INTRAVENOUS

## 2021-09-21 MED ORDER — FENTANYL CITRATE (PF) 100 MCG/2ML IJ SOLN
INTRAMUSCULAR | Status: AC
  Filled 2021-09-21: qty 2

## 2021-09-21 MED ORDER — SODIUM CHLORIDE 0.9 % IV SOLN: INTRAVENOUS | Status: DC

## 2021-09-21 MED ORDER — PROPOFOL 500 MG/50ML IV EMUL: INTRAVENOUS | Status: DC | PRN

## 2021-09-21 NOTE — Anesthesia Preprocedure Evaluation (Signed)
Anesthesia Evaluation  Patient identified by MRN, date of birth, ID band Patient awake    Reviewed: Allergy & Precautions, NPO status , Patient's Chart, lab work & pertinent test results  History of Anesthesia Complications Negative for: history of anesthetic complications  Airway Mallampati: IV   Neck ROM: Full    Dental  (+) Partial Lower, Upper Dentures   Pulmonary neg pulmonary ROS,    Pulmonary exam normal breath sounds clear to auscultation       Cardiovascular hypertension, Normal cardiovascular exam Rhythm:Regular Rate:Normal     Neuro/Psych negative neurological ROS     GI/Hepatic Crohn's disease   Endo/Other  Obesity, prediabetes  Renal/GU negative Renal ROS     Musculoskeletal   Abdominal   Peds  Hematology  (+) Blood dyscrasia, anemia ,   Anesthesia Other Findings   Reproductive/Obstetrics                             Anesthesia Physical Anesthesia Plan  ASA: 3  Anesthesia Plan: General   Post-op Pain Management:    Induction: Intravenous  PONV Risk Score and Plan: 3 and Propofol infusion, TIVA and Treatment may vary due to age or medical condition  Airway Management Planned: Natural Airway  Additional Equipment:   Intra-op Plan:   Post-operative Plan:   Informed Consent: I have reviewed the patients History and Physical, chart, labs and discussed the procedure including the risks, benefits and alternatives for the proposed anesthesia with the patient or authorized representative who has indicated his/her understanding and acceptance.       Plan Discussed with: CRNA  Anesthesia Plan Comments:         Anesthesia Quick Evaluation

## 2021-09-21 NOTE — Op Note (Signed)
[facility] Gastroenterology Patient Name: Christy Summers Procedure Date: 09/21/2021 11:15 AM MRN: [id number] Account #: [id number] Date of Birth: 03/13/1951 Admit Type: Outpatient Age: 70 Room: [facility] ENDO ROOM 3 Gender: Female Note Status: Finalized Instrument Name: Jasper Riling [id number] Procedure:             Colonoscopy Indications:           Disease activity assessment of Crohn's disease of the                         small bowel and colon Providers:             Andrey Farmer MD, MD Referring MD:          Tracie Harrier, MD (Referring MD) Medicines:             Monitored Anesthesia Care Complications:         No immediate complications. Estimated blood loss:                         Minimal. Procedure:             Pre-Anesthesia Assessment:                        - Prior to the procedure, a History and Physical was                         performed, and patient medications and allergies were                         reviewed. The patient is competent. The risks and                         benefits of the procedure and the sedation options and                         risks were discussed with the patient. All questions                         were answered and informed consent was obtained.                         Patient identification and proposed procedure were                         verified by the physician, the nurse, the anesthetist                         and the technician in the endoscopy suite. Mental                         Status Examination: alert and oriented. Airway                         Examination: normal oropharyngeal airway and neck                         mobility. Respiratory Examination: clear to  auscultation. CV Examination: normal. Prophylactic                         Antibiotics: The patient does not require prophylactic                         antibiotics. Prior Anticoagulants: The patient has                          taken no previous anticoagulant or antiplatelet                         agents. ASA Grade Assessment: III - A patient with                         severe systemic disease. After reviewing the risks and                         benefits, the patient was deemed in satisfactory                         condition to undergo the procedure. The anesthesia                         plan was to use monitored anesthesia care (MAC).                         Immediately prior to administration of medications,                         the patient was re-assessed for adequacy to receive                         sedatives. The heart rate, respiratory rate, oxygen                         saturations, blood pressure, adequacy of pulmonary                         ventilation, and response to care were monitored                         throughout the procedure. The physical status of the                         patient was re-assessed after the procedure.                        After obtaining informed consent, the colonoscope was                         passed under direct vision. Throughout the procedure,                         the patient's blood pressure, pulse, and oxygen                         saturations were monitored continuously. The  Colonoscope was introduced through the anus and                         advanced to the the terminal ileum. The colonoscopy                         was performed without difficulty. The patient                         tolerated the procedure well. The quality of the bowel                         preparation was good. Findings:      The perianal and digital rectal examinations were normal.      A diffuse area of the terminal ileum was congested. Biopsies were taken       with a cold forceps for histology. Estimated blood loss was minimal.      The Simple Endoscopic Score for Crohn's Disease was determined based on       the endoscopic  appearance of the mucosa in the following segments:      - Ileum: Findings include no ulcers present, no ulcerated surfaces,       greater than 75% of surfaces affected and no narrowings. Segment score:       3.      - Right Colon: Findings include aphthous ulcers less than 0.5 cm in       size, greater than 30% ulcerated surfaces and greater than 75% of       surfaces affected. Segment score: 7.      - Transverse Colon: Findings include aphthous ulcers less than 0.5 cm in       size, greater than 30% ulcerated surfaces and greater than 75% of       surfaces affected. Segment score: 7.      - Left Colon: Findings include aphthous ulcers less than 0.5 cm in size,       greater than 30% ulcerated surfaces and greater than 75% of surfaces       affected. Segment score: 7.      - Rectum: Findings include aphthous ulcers less than 0.5 cm in size,       greater than 30% ulcerated surfaces, greater than 75% of surfaces       affected and no narrowings. Segment score: 7.      - Total SES-CD aggregate score: 31. Biopsies were taken with a cold       forceps for histology. Estimated blood loss was minimal.      Internal hemorrhoids were found during retroflexion. The hemorrhoids       were Grade I (internal hemorrhoids that do not prolapse). Impression:            - Congested mucosa in the terminal ileum. Biopsied.                        - Simple Endoscopic Score for Crohn's Disease: 31,                         mucosal inflammatory changes secondary to Crohn's                         disease, with ileitis and colitis. Biopsied.                        -  Internal hemorrhoids. Recommendation:        - Will need to check stool studies for infection and                         then likely increase entyvio to q4. Then follow-up                         path to decide on increasing frequency of drug or                         switching drug class.                        - Discharge patient to home.                         - Resume previous diet.                        - Continue present medications.                        - Await pathology results.                        - Repeat colonoscopy in 3-6 months to evaluate the                         response to therapy.                        - Return to referring physician as previously                         scheduled. Procedure Code(s):     --- Professional ---                        [phone], Colonoscopy, flexible; with biopsy, single or                         multiple Diagnosis Code(s):     --- Professional ---                        K63.89, Other specified diseases of intestine                        K50.80, Crohn's disease of both small and large                         intestine without complications                        K64.0, First degree hemorrhoids CPT copyright 2019 American Medical Association. All rights reserved. The codes documented in this report are preliminary and upon coder review may  be revised to meet current compliance requirements. Andrey Farmer MD, MD 09/21/2021 12:01:47 PM Number of Addenda: 0 Note Initiated On: 09/21/2021 11:15 AM Scope Withdrawal Time: 0 hours 10 minutes 33 seconds  Total Procedure Duration: 0 hours 15 minutes 11 seconds  Estimated Blood Loss:  Estimated blood loss was minimal.  [facility]

## 2021-09-21 NOTE — Transfer of Care (Signed)
Immediate Anesthesia Transfer of Care Note  Patient: Christy Summers  Procedure(s) Performed: COLONOSCOPY WITH PROPOFOL  Patient Location: Endoscopy Unit  Anesthesia Type:General  Level of Consciousness: drowsy  Airway & Oxygen Therapy: Patient Spontanous Breathing  Post-op Assessment: Report given to RN and Post -op Vital signs reviewed and stable  Post vital signs: Reviewed and stable  Last Vitals:  Vitals Value Taken Time  BP 95/56 09/21/21 1149  Temp    Pulse 71 09/21/21 1150  Resp 25 09/21/21 1150  SpO2 95 % 09/21/21 1150  Vitals shown include unvalidated device data.  Last Pain:  Vitals:   09/21/21 0957  TempSrc: Temporal  PainSc: 0-No pain         Complications: No notable events documented.

## 2021-09-21 NOTE — H&P (Signed)
Outpatient short stay form Pre-procedure 09/21/2021  Christy Rubenstein, MD  Primary Physician: Tracie Harrier, MD  Reason for visit:  Hx of psc and crohns  History of present illness:   Ms. Christy Summers is a 70 y/o lady with history of [employer] and crohns diagnosed last year on biopsies. She is on vedoluzimab but there's some evidence this might not be effective. She is here for cancer screening given high risk of malignancy in [facility] and crohns patients. No blood thinners. History of hysterectomy and cholecystectomy. No family history of GI malignancies.    Current Facility-Administered Medications:    0.9 %  sodium chloride infusion, , Intravenous, Continuous, Vennie Salsbury, Hilton Cork, MD, Last Rate: 20 mL/hr at 09/21/21 1015, New Bag at 09/21/21 1015  Medications Prior to Admission  Medication Sig Dispense Refill Last Dose   amLODipine (NORVASC) 10 MG tablet Take 10 mg by mouth daily.   09/21/2021 at 0600   Ascorbic Acid (VITAMIN C) 500 MG CAPS Take 500 mg by mouth daily.    Past Week   aspirin 81 MG EC tablet Take 81 mg by mouth daily.    09/20/2021   calcium elemental as carbonate (TUMS ULTRA 1000) 400 MG chewable tablet Chew 1,000 mg by mouth daily.   Past Week   Cholecalciferol 25 MCG (1000 UT) tablet Take 1,000 Units by mouth daily.    Past Week   cyanocobalamin 1000 MCG tablet Take 1,000 mcg by mouth daily.   Past Week   losartan-hydrochlorothiazide (HYZAAR) 100-25 MG tablet Take 1 tablet by mouth daily.   09/21/2021 at 0600   potassium chloride (K-DUR) 10 MEQ tablet Take 10 mEq by mouth 2 (two) times daily.    Past Week   iron polysaccharides (NIFEREX) 150 MG capsule Take 150 mg by mouth daily.       loperamide (IMODIUM A-D) 2 MG tablet Take 2 mg by mouth 4 (four) times daily as needed for diarrhea or loose stools.        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 has been positive and negative since 2017. most recent result was negative. diarrhea persists   Hypertension     Review of systems:  Otherwise negative.    Physical Exam  Gen: Alert, oriented. Appears stated age.  HEENT:  PERRLA. Lungs: No respiratory distress CV: RRR Abd: soft, benign, no masses Ext: No edema    Planned procedures: Proceed with colonoscopy. The patient understands the nature of the planned procedure, indications, risks, alternatives and potential complications including but not limited to bleeding, infection, perforation, damage to internal organs and possible oversedation/side effects from anesthesia. The patient agrees and gives consent to proceed.  Please refer to procedure notes for findings, recommendations and patient disposition/instructions.     Christy Rubenstein, MD [facility] Gastroenterology

## 2021-09-21 NOTE — Anesthesia Postprocedure Evaluation (Signed)
Anesthesia Post Note  Patient: Christy Summers  Procedure(s) Performed: COLONOSCOPY WITH PROPOFOL  Patient location during evaluation: PACU Anesthesia Type: General Level of consciousness: awake and alert, oriented and patient cooperative Pain management: pain level controlled Vital Signs Assessment: post-procedure vital signs reviewed and stable Respiratory status: spontaneous breathing, nonlabored ventilation and respiratory function stable Cardiovascular status: blood pressure returned to baseline and stable Postop Assessment: adequate PO intake Anesthetic complications: no   No notable events documented.   Last Vitals:  Vitals:   09/21/21 1205 09/21/21 1210  BP:  111/72  Pulse: 68 69  Resp: (!) 26 15  Temp:    SpO2: 98% 100%    Last Pain:  Vitals:   09/21/21 0957  TempSrc: Temporal  PainSc: 0-No pain                 Darrin Nipper

## 2021-09-21 NOTE — Interval H&P Note (Signed)
History and Physical Interval Note:  09/21/2021 11:20 AM  Christy Summers  has presented today for surgery, with the diagnosis of CROHN'S DISEASE.  The various methods of treatment have been discussed with the patient and family. After consideration of risks, benefits and other options for treatment, the patient has consented to  Procedure(s): COLONOSCOPY WITH PROPOFOL (N/A) as a surgical intervention.  The patient's history has been reviewed, patient examined, no change in status, stable for surgery.  I have reviewed the patient's chart and labs.  Questions were answered to the patient's satisfaction.     Lesly Rubenstein  Ok to proceed with colonoscopy

## 2021-09-22 ENCOUNTER — Other Ambulatory Visit: Payer: Self-pay

## 2021-09-22 ENCOUNTER — Encounter: Payer: Self-pay | Admitting: Gastroenterology

## 2021-09-22 ENCOUNTER — Ambulatory Visit: Payer: PPO | Attending: Internal Medicine

## 2021-09-22 DIAGNOSIS — Z23 Encounter for immunization: Secondary | ICD-10-CM

## 2021-09-22 LAB — SURGICAL PATHOLOGY

## 2021-09-22 MED ORDER — PFIZER COVID-19 VAC BIVALENT 30 MCG/0.3ML IM SUSP
INTRAMUSCULAR | 0 refills | Status: DC
  Filled 2021-09-22: qty 0.3, 1d supply, fill #0

## 2021-09-22 NOTE — Progress Notes (Signed)
   Covid-19 Vaccination Clinic  Name:  ABREE ROMICK    MRN: [id number] DOB: [birth year]  09/22/2021  Ms. Domino was observed post Covid-19 immunization for 15 minutes without incident. She was provided with Vaccine Information Sheet and instruction to access the V-Safe system.   Ms. Hubbs was instructed to call 911 with any severe reactions post vaccine: Difficulty breathing  Swelling of face and throat  A fast heartbeat  A bad rash all over body  Dizziness and weakness   Lu Duffel, PharmD, MBA Clinical Acute Care Pharmacist n

## 2021-09-30 DIAGNOSIS — R197 Diarrhea, unspecified: Secondary | ICD-10-CM | POA: Diagnosis not present

## 2021-10-21 DIAGNOSIS — K8301 Primary sclerosing cholangitis: Secondary | ICD-10-CM | POA: Diagnosis not present

## 2021-10-21 DIAGNOSIS — K50119 Crohn's disease of large intestine with unspecified complications: Secondary | ICD-10-CM | POA: Diagnosis not present

## 2021-10-29 ENCOUNTER — Other Ambulatory Visit: Payer: Self-pay | Admitting: Gastroenterology

## 2021-10-29 ENCOUNTER — Other Ambulatory Visit: Payer: Self-pay | Admitting: Pulmonary Disease

## 2021-10-29 DIAGNOSIS — K8301 Primary sclerosing cholangitis: Secondary | ICD-10-CM

## 2021-10-29 DIAGNOSIS — K5080 Crohn's disease of both small and large intestine without complications: Secondary | ICD-10-CM | POA: Diagnosis not present

## 2021-10-30 DIAGNOSIS — K8301 Primary sclerosing cholangitis: Secondary | ICD-10-CM | POA: Diagnosis not present

## 2021-10-30 DIAGNOSIS — K50119 Crohn's disease of large intestine with unspecified complications: Secondary | ICD-10-CM | POA: Diagnosis not present

## 2021-11-09 ENCOUNTER — Ambulatory Visit
Admission: RE | Admit: 2021-11-09 | Discharge: 2021-11-09 | Disposition: A | Discharge status: Home / Self Care | Payer: PPO | Source: Ambulatory Visit | Attending: Gastroenterology | Admitting: Gastroenterology

## 2021-11-09 DIAGNOSIS — K8301 Primary sclerosing cholangitis: Secondary | ICD-10-CM | POA: Insufficient documentation

## 2021-11-09 DIAGNOSIS — R935 Abnormal findings on diagnostic imaging of other abdominal regions, including retroperitoneum: Secondary | ICD-10-CM | POA: Diagnosis not present

## 2021-11-09 DIAGNOSIS — K5090 Crohn's disease, unspecified, without complications: Secondary | ICD-10-CM | POA: Diagnosis not present

## 2021-11-09 DIAGNOSIS — R932 Abnormal findings on diagnostic imaging of liver and biliary tract: Secondary | ICD-10-CM | POA: Diagnosis not present

## 2021-11-09 IMAGING — MR MR ABDOMEN WO/W CM MRCP
19 of 21 series · 44 of 48 positions shown · IV contrast (gadavist)
Comparison: [DATE]

CLINICAL DATA: Primary sclerosing cholangitis.  Crohn disease.

EXAM:
MRI ABDOMEN WITHOUT AND WITH CONTRAST (INCLUDING MRCP)
TECHNIQUE: Multiplanar multisequence MR imaging of the abdomen was performed
both before and after the administration of intravenous contrast.
Heavily T2-weighted images of the biliary and pancreatic ducts were
obtained, and three-dimensional MRCP images were rendered by post
processing.
CONTRAST:  9mL GADAVIST GADOBUTROL 1 MMOL/ML IV SOLN

[Series 3: T2 · coronal · 6.0mm · 1.19mm/px · 1 of 32 slices shown (1 of 2)]
[im 1/32]
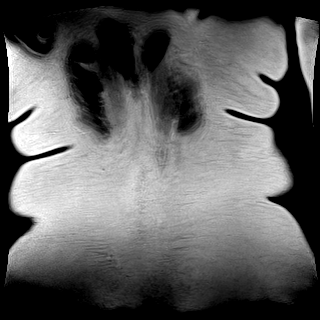

[Series 4: T2 · axial · 6.0mm · 1.19mm/px · 1 of 32 slices shown (2 of 2)]
[im 1/32]
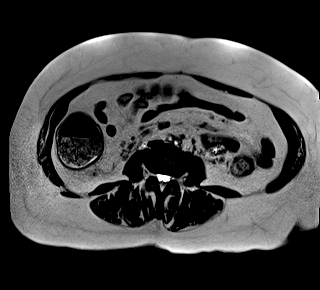

[Series 5: in & out · axial · 3.0mm · 1.19mm/px · z∈[-41,+172]mm · 2 of 72 slices shown (1 of 2)]
[im 1/72]
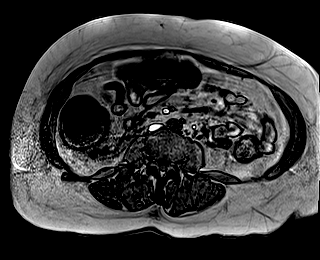
[im 72/72]
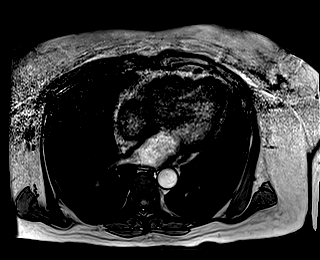

[Series 6: in & out · axial · 3.0mm · 1.19mm/px · z∈[-41,+172]mm · 2 of 72 slices shown (2 of 2)]
[im 1/72]
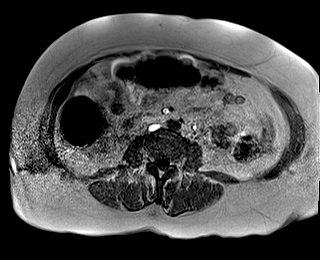
[im 72/72]
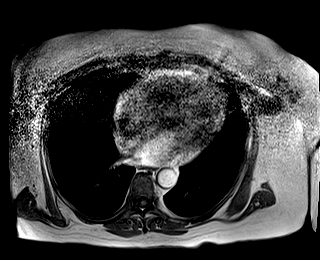

[Series 9: T2 fat-sat · axial · 6.0mm · 1.19mm/px · 1 of 30 slices shown]
[im 1/30]
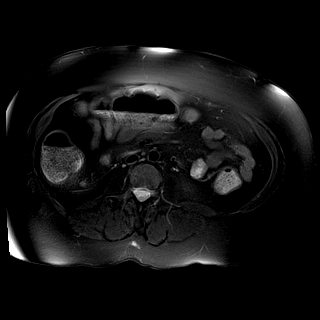

[Series 10: ax dwi_tracew · axial · 6.0mm · 1.42mm/px · z∈[-29,+180]mm · 4 of 90 slices shown]
[im 1/90]
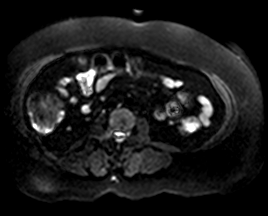
[im 30/90]
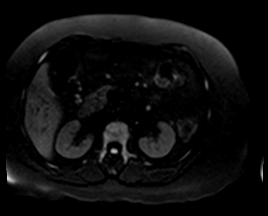
[im 60/90]
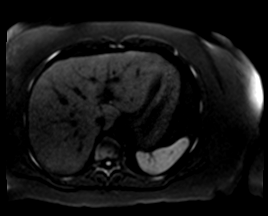
[im 90/90]
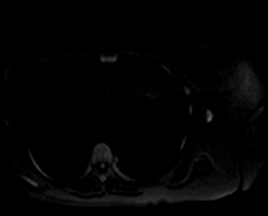

[Series 11: ax dwi_adc · axial · 6.0mm · 1.42mm/px · 1 of 30 slices shown]
[im 1/30]
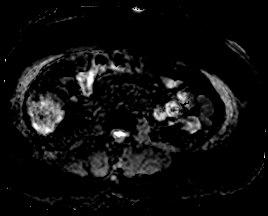

[Series 16: MRCP · coronal · 3.0mm · 1.12mm/px · 1 of 20 slices shown]
[im 1/20]
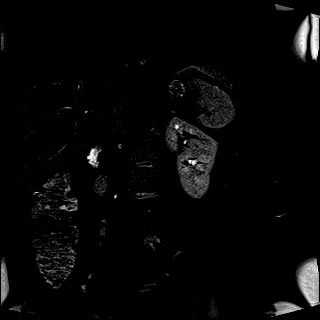

[Series 18: radials · coronal · 50.0mm · 0.78mm/px · 1 of 5 slices shown]
[im 1/5]
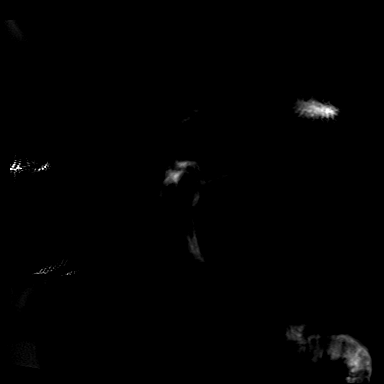

[Series 19: T1 dynamic fat-sat · axial · non-contrast · 3.0mm · 1.19mm/px · z∈[-50,+163]mm · 3 of 72 slices shown (1 of 5)]
[im 1/72]
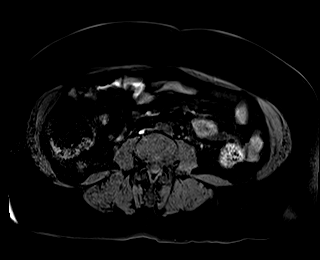
[im 36/72]
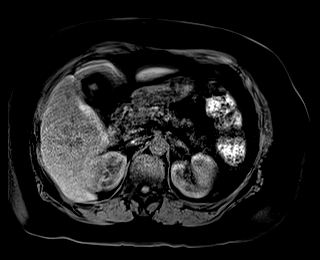
[im 72/72]
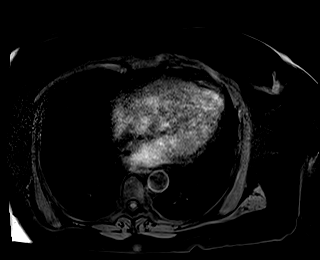

[Series 20: T1 dynamic fat-sat post-contrast · axial · 3.0mm · 1.19mm/px · z∈[-50,+163]mm · 3 of 72 slices shown (1 of 4)]
[im 1/72]
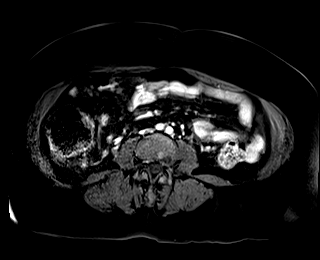
[im 36/72]
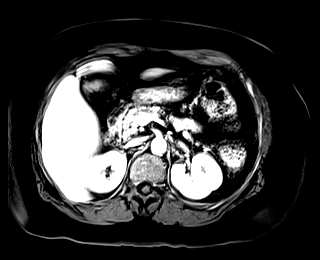
[im 72/72]
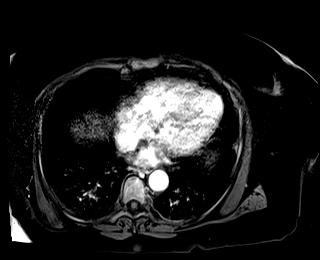

[Series 21: T1 dynamic fat-sat · axial · 3.0mm · 1.19mm/px · z∈[-50,+163]mm · 3 of 72 slices shown (2 of 5)]
[im 1/72]
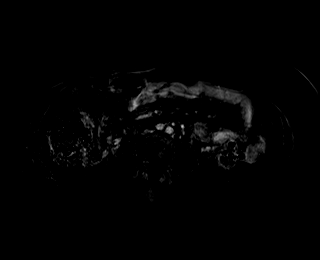
[im 36/72]
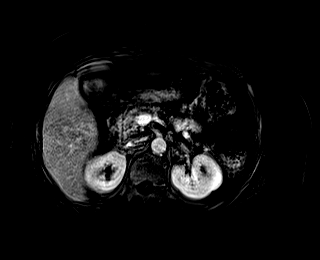
[im 72/72]
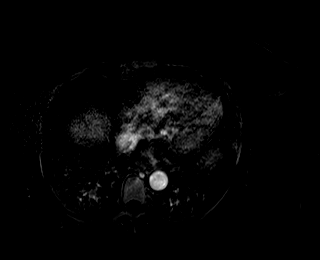

[Series 22: T1 dynamic fat-sat post-contrast · axial · 3.0mm · 1.19mm/px · z∈[-50,+163]mm · 3 of 72 slices shown (2 of 4)]
[im 1/72]
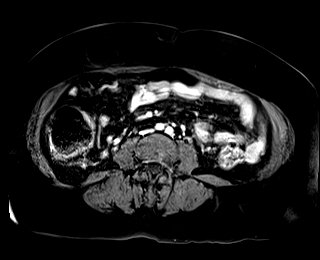
[im 36/72]
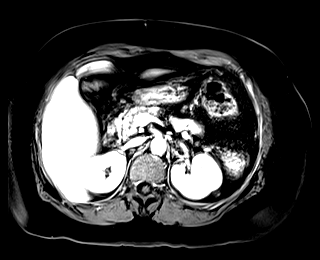
[im 72/72]
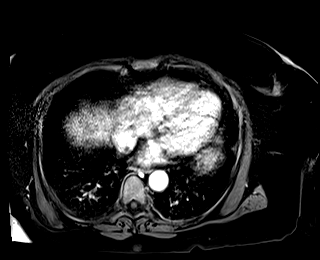

[Series 23: T1 dynamic fat-sat · axial · 3.0mm · 1.19mm/px · z∈[-50,+163]mm · 3 of 72 slices shown (3 of 5)]
[im 1/72]
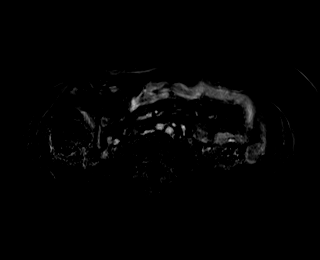
[im 36/72]
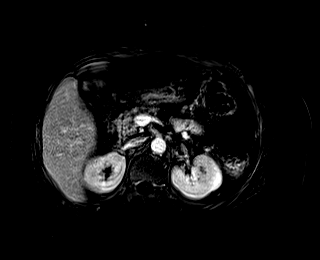
[im 72/72]
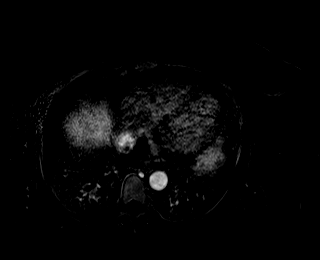

[Series 24: T1 dynamic fat-sat post-contrast · axial · 3.0mm · 1.19mm/px · z∈[-50,+163]mm · 3 of 72 slices shown (3 of 4)]
[im 1/72]
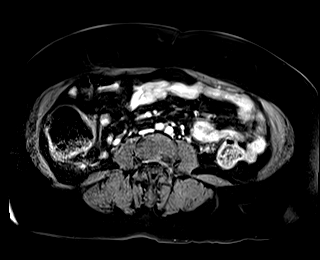
[im 36/72]
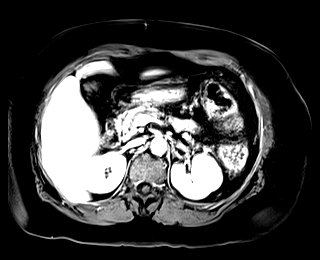
[im 72/72]
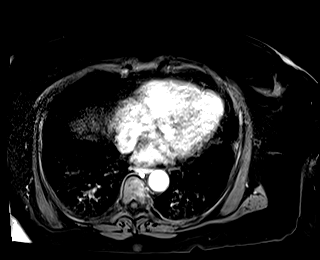

[Series 25: T1 dynamic fat-sat · axial · 3.0mm · 1.19mm/px · z∈[-50,+163]mm · 3 of 72 slices shown (4 of 5)]
[im 1/72]
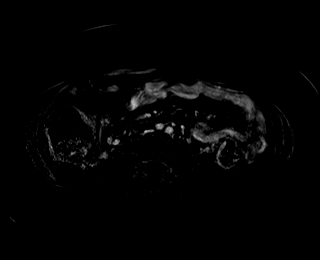
[im 36/72]
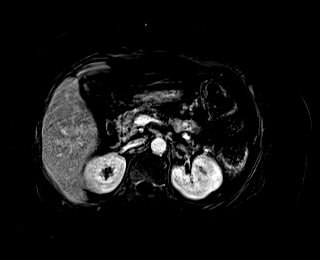
[im 72/72]
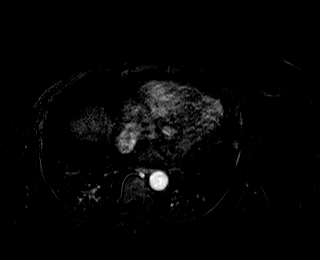

[Series 26: T1 dynamic post-contrast · coronal · 3.0mm · 1.31mm/px · 3 of 72 slices shown]
[im 1/72]
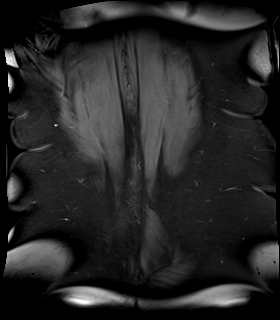
[im 36/72]
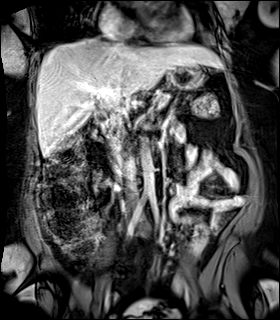
[im 72/72]
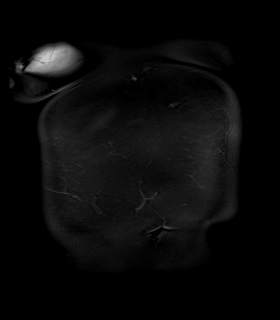

[Series 27: T1 dynamic fat-sat post-contrast · axial · 3.0mm · 1.19mm/px · z∈[-50,+163]mm · 3 of 72 slices shown (4 of 4)]
[im 1/72]
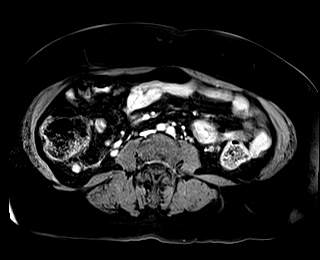
[im 36/72]
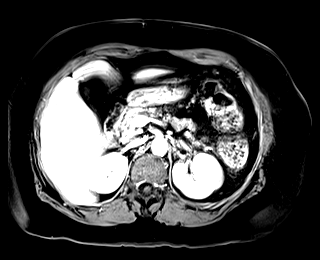
[im 72/72]
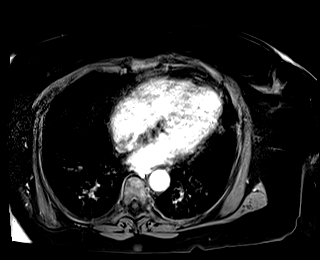

[Series 28: T1 dynamic fat-sat · axial · 3.0mm · 1.19mm/px · z∈[-50,+163]mm · 3 of 72 slices shown (5 of 5)]
[im 1/72]
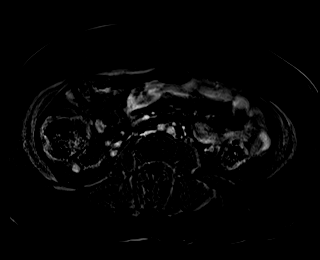
[im 36/72]
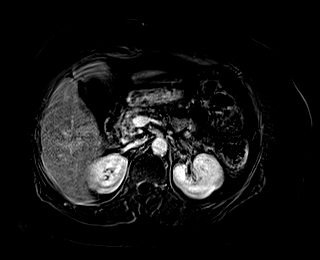
[im 72/72]
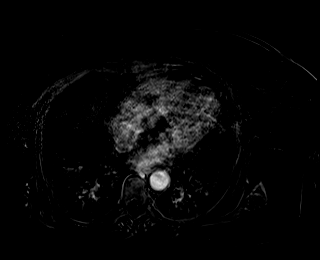

[44 of 48 positions shown; findings below may reference images not displayed]

FINDINGS: Lower chest: No acute findings.

Hepatobiliary: No hepatic masses identified. Prior cholecystectomy
again noted. Irregular contour and mural enhancement of the common
bile duct is again seen, without significant change compared to
previous study. Mild dilatation and irregular contour of the
intrahepatic bile ducts is again seen, also similar in appearance to
prior exam. These findings remain consistent with sclerosing
cholangitis. No masses are identified.

Pancreas: No mass or inflammatory changes. No evidence of pancreatic
ductal dilatation.

Spleen:  Within normal limits in size and appearance.

Adrenals/Urinary Tract: No masses identified. No evidence of
hydronephrosis.

Stomach/Bowel: Visualized portion unremarkable.

Vascular/Lymphatic: No pathologically enlarged lymph nodes
identified. No acute vascular findings.

Other:  None.

Musculoskeletal:  No suspicious bone lesions identified.
IMPRESSION: Similar appearance of diffuse irregularity and mural enhancement of
the common bile duct and intrahepatic bile ducts, consistent with
sclerosing cholangitis.

No evidence of neoplasm or other acute findings.

## 2021-11-09 MED ORDER — GADOBUTROL 1 MMOL/ML IV SOLN
9.0000 mL | Freq: Once | INTRAVENOUS | Status: AC | PRN
  Administered 2021-11-09: 9 mL via INTRAVENOUS

## 2021-11-10 DIAGNOSIS — R7989 Other specified abnormal findings of blood chemistry: Secondary | ICD-10-CM | POA: Diagnosis not present

## 2021-11-10 DIAGNOSIS — D649 Anemia, unspecified: Secondary | ICD-10-CM | POA: Diagnosis not present

## 2021-11-10 DIAGNOSIS — I10 Essential (primary) hypertension: Secondary | ICD-10-CM | POA: Diagnosis not present

## 2021-11-10 DIAGNOSIS — B079 Viral wart, unspecified: Secondary | ICD-10-CM | POA: Diagnosis not present

## 2021-11-10 DIAGNOSIS — K8301 Primary sclerosing cholangitis: Secondary | ICD-10-CM | POA: Diagnosis not present

## 2021-11-10 DIAGNOSIS — Z8619 Personal history of other infectious and parasitic diseases: Secondary | ICD-10-CM | POA: Diagnosis not present

## 2021-11-10 DIAGNOSIS — E8809 Other disorders of plasma-protein metabolism, not elsewhere classified: Secondary | ICD-10-CM | POA: Diagnosis not present

## 2021-11-10 DIAGNOSIS — K50919 Crohn's disease, unspecified, with unspecified complications: Secondary | ICD-10-CM | POA: Diagnosis not present

## 2021-11-10 DIAGNOSIS — R195 Other fecal abnormalities: Secondary | ICD-10-CM | POA: Diagnosis not present

## 2021-11-10 DIAGNOSIS — E7800 Pure hypercholesterolemia, unspecified: Secondary | ICD-10-CM | POA: Diagnosis not present

## 2021-11-17 DIAGNOSIS — K50119 Crohn's disease of large intestine with unspecified complications: Secondary | ICD-10-CM | POA: Diagnosis not present

## 2021-11-17 DIAGNOSIS — E8809 Other disorders of plasma-protein metabolism, not elsewhere classified: Secondary | ICD-10-CM | POA: Diagnosis not present

## 2021-11-17 DIAGNOSIS — R7309 Other abnormal glucose: Secondary | ICD-10-CM | POA: Diagnosis not present

## 2021-11-17 DIAGNOSIS — I358 Other nonrheumatic aortic valve disorders: Secondary | ICD-10-CM | POA: Diagnosis not present

## 2021-11-17 DIAGNOSIS — K8301 Primary sclerosing cholangitis: Secondary | ICD-10-CM | POA: Diagnosis not present

## 2021-11-17 DIAGNOSIS — Z8619 Personal history of other infectious and parasitic diseases: Secondary | ICD-10-CM | POA: Diagnosis not present

## 2021-11-17 DIAGNOSIS — Z0000 Encounter for general adult medical examination without abnormal findings: Secondary | ICD-10-CM | POA: Diagnosis not present

## 2021-11-17 DIAGNOSIS — I10 Essential (primary) hypertension: Secondary | ICD-10-CM | POA: Diagnosis not present

## 2022-01-27 DIAGNOSIS — K5080 Crohn's disease of both small and large intestine without complications: Secondary | ICD-10-CM | POA: Diagnosis not present

## 2022-01-29 DIAGNOSIS — K5080 Crohn's disease of both small and large intestine without complications: Secondary | ICD-10-CM | POA: Diagnosis not present

## 2022-02-09 DIAGNOSIS — K5080 Crohn's disease of both small and large intestine without complications: Secondary | ICD-10-CM | POA: Diagnosis not present

## 2022-02-18 DIAGNOSIS — K5080 Crohn's disease of both small and large intestine without complications: Secondary | ICD-10-CM | POA: Diagnosis not present

## 2022-03-10 DIAGNOSIS — R7309 Other abnormal glucose: Secondary | ICD-10-CM | POA: Diagnosis not present

## 2022-03-10 DIAGNOSIS — D649 Anemia, unspecified: Secondary | ICD-10-CM | POA: Diagnosis not present

## 2022-03-10 DIAGNOSIS — Z0000 Encounter for general adult medical examination without abnormal findings: Secondary | ICD-10-CM | POA: Diagnosis not present

## 2022-03-10 DIAGNOSIS — K50119 Crohn's disease of large intestine with unspecified complications: Secondary | ICD-10-CM | POA: Diagnosis not present

## 2022-03-10 DIAGNOSIS — I10 Essential (primary) hypertension: Secondary | ICD-10-CM | POA: Diagnosis not present

## 2022-03-10 DIAGNOSIS — E8809 Other disorders of plasma-protein metabolism, not elsewhere classified: Secondary | ICD-10-CM | POA: Diagnosis not present

## 2022-03-10 DIAGNOSIS — Z8619 Personal history of other infectious and parasitic diseases: Secondary | ICD-10-CM | POA: Diagnosis not present

## 2022-03-10 DIAGNOSIS — K8301 Primary sclerosing cholangitis: Secondary | ICD-10-CM | POA: Diagnosis not present

## 2022-03-25 DIAGNOSIS — K5080 Crohn's disease of both small and large intestine without complications: Secondary | ICD-10-CM | POA: Diagnosis not present

## 2022-04-02 DIAGNOSIS — I10 Essential (primary) hypertension: Secondary | ICD-10-CM | POA: Diagnosis not present

## 2022-04-02 DIAGNOSIS — Z23 Encounter for immunization: Secondary | ICD-10-CM | POA: Diagnosis not present

## 2022-04-02 DIAGNOSIS — Z0000 Encounter for general adult medical examination without abnormal findings: Secondary | ICD-10-CM | POA: Diagnosis not present

## 2022-04-02 DIAGNOSIS — Z6837 Body mass index (BMI) 37.0-37.9, adult: Secondary | ICD-10-CM | POA: Diagnosis not present

## 2022-04-02 DIAGNOSIS — E669 Obesity, unspecified: Secondary | ICD-10-CM | POA: Diagnosis not present

## 2022-04-02 DIAGNOSIS — K529 Noninfective gastroenteritis and colitis, unspecified: Secondary | ICD-10-CM | POA: Diagnosis not present

## 2022-04-02 DIAGNOSIS — K8301 Primary sclerosing cholangitis: Secondary | ICD-10-CM | POA: Diagnosis not present

## 2022-04-02 DIAGNOSIS — R7989 Other specified abnormal findings of blood chemistry: Secondary | ICD-10-CM | POA: Diagnosis not present

## 2022-04-02 DIAGNOSIS — E8809 Other disorders of plasma-protein metabolism, not elsewhere classified: Secondary | ICD-10-CM | POA: Diagnosis not present

## 2022-04-22 DIAGNOSIS — K5080 Crohn's disease of both small and large intestine without complications: Secondary | ICD-10-CM | POA: Diagnosis not present

## 2022-05-12 ENCOUNTER — Other Ambulatory Visit: Payer: Self-pay | Admitting: Internal Medicine

## 2022-05-12 DIAGNOSIS — Z1231 Encounter for screening mammogram for malignant neoplasm of breast: Secondary | ICD-10-CM

## 2022-06-02 DIAGNOSIS — R197 Diarrhea, unspecified: Secondary | ICD-10-CM | POA: Diagnosis not present

## 2022-06-24 ENCOUNTER — Ambulatory Visit
Admission: RE | Admit: 2022-06-24 | Discharge: 2022-06-24 | Disposition: A | Discharge status: Home / Self Care | Payer: PPO | Source: Ambulatory Visit | Attending: Internal Medicine | Admitting: Internal Medicine

## 2022-06-24 DIAGNOSIS — Z1231 Encounter for screening mammogram for malignant neoplasm of breast: Secondary | ICD-10-CM | POA: Diagnosis not present

## 2022-07-08 DIAGNOSIS — Z9289 Personal history of other medical treatment: Secondary | ICD-10-CM | POA: Diagnosis not present

## 2022-07-23 DIAGNOSIS — K50119 Crohn's disease of large intestine with unspecified complications: Secondary | ICD-10-CM | POA: Diagnosis not present

## 2022-07-26 DIAGNOSIS — E8809 Other disorders of plasma-protein metabolism, not elsewhere classified: Secondary | ICD-10-CM | POA: Diagnosis not present

## 2022-07-26 DIAGNOSIS — K8301 Primary sclerosing cholangitis: Secondary | ICD-10-CM | POA: Diagnosis not present

## 2022-07-26 DIAGNOSIS — K529 Noninfective gastroenteritis and colitis, unspecified: Secondary | ICD-10-CM | POA: Diagnosis not present

## 2022-07-26 DIAGNOSIS — Z6837 Body mass index (BMI) 37.0-37.9, adult: Secondary | ICD-10-CM | POA: Diagnosis not present

## 2022-07-26 DIAGNOSIS — R7989 Other specified abnormal findings of blood chemistry: Secondary | ICD-10-CM | POA: Diagnosis not present

## 2022-07-26 DIAGNOSIS — I10 Essential (primary) hypertension: Secondary | ICD-10-CM | POA: Diagnosis not present

## 2022-07-26 DIAGNOSIS — E669 Obesity, unspecified: Secondary | ICD-10-CM | POA: Diagnosis not present

## 2022-07-28 ENCOUNTER — Other Ambulatory Visit: Payer: Self-pay | Admitting: Gastroenterology

## 2022-07-28 DIAGNOSIS — K8301 Primary sclerosing cholangitis: Secondary | ICD-10-CM | POA: Diagnosis not present

## 2022-07-29 ENCOUNTER — Other Ambulatory Visit: Payer: Self-pay | Admitting: Gastroenterology

## 2022-07-30 ENCOUNTER — Other Ambulatory Visit: Payer: Self-pay | Admitting: Gastroenterology

## 2022-07-30 DIAGNOSIS — K8301 Primary sclerosing cholangitis: Secondary | ICD-10-CM

## 2022-08-06 ENCOUNTER — Inpatient Hospital Stay
Admission: EM | Admit: 2022-08-06 | Discharge: 2022-08-09 | DRG: 372 | Disposition: A | Discharge status: Home / Self Care | Payer: PPO | Source: Ambulatory Visit | Attending: Internal Medicine | Admitting: Internal Medicine

## 2022-08-06 ENCOUNTER — Other Ambulatory Visit: Payer: Self-pay

## 2022-08-06 ENCOUNTER — Emergency Department: Payer: PPO

## 2022-08-06 ENCOUNTER — Encounter: Payer: Self-pay | Admitting: Emergency Medicine

## 2022-08-06 DIAGNOSIS — R778 Other specified abnormalities of plasma proteins: Secondary | ICD-10-CM | POA: Diagnosis not present

## 2022-08-06 DIAGNOSIS — A0472 Enterocolitis due to Clostridium difficile, not specified as recurrent: Secondary | ICD-10-CM

## 2022-08-06 DIAGNOSIS — R140 Abdominal distension (gaseous): Secondary | ICD-10-CM | POA: Diagnosis not present

## 2022-08-06 DIAGNOSIS — E860 Dehydration: Secondary | ICD-10-CM | POA: Diagnosis not present

## 2022-08-06 DIAGNOSIS — Z90710 Acquired absence of both cervix and uterus: Secondary | ICD-10-CM | POA: Diagnosis not present

## 2022-08-06 DIAGNOSIS — E876 Hypokalemia: Secondary | ICD-10-CM | POA: Diagnosis not present

## 2022-08-06 DIAGNOSIS — Z7982 Long term (current) use of aspirin: Secondary | ICD-10-CM | POA: Diagnosis not present

## 2022-08-06 DIAGNOSIS — I700 Atherosclerosis of aorta: Secondary | ICD-10-CM | POA: Diagnosis not present

## 2022-08-06 DIAGNOSIS — K50919 Crohn's disease, unspecified, with unspecified complications: Secondary | ICD-10-CM | POA: Diagnosis not present

## 2022-08-06 DIAGNOSIS — D649 Anemia, unspecified: Secondary | ICD-10-CM | POA: Diagnosis present

## 2022-08-06 DIAGNOSIS — Z823 Family history of stroke: Secondary | ICD-10-CM | POA: Diagnosis not present

## 2022-08-06 DIAGNOSIS — Z8249 Family history of ischemic heart disease and other diseases of the circulatory system: Secondary | ICD-10-CM

## 2022-08-06 DIAGNOSIS — Z79899 Other long term (current) drug therapy: Secondary | ICD-10-CM

## 2022-08-06 DIAGNOSIS — N179 Acute kidney failure, unspecified: Secondary | ICD-10-CM | POA: Diagnosis not present

## 2022-08-06 DIAGNOSIS — K5090 Crohn's disease, unspecified, without complications: Secondary | ICD-10-CM | POA: Diagnosis not present

## 2022-08-06 DIAGNOSIS — A0471 Enterocolitis due to Clostridium difficile, recurrent: Secondary | ICD-10-CM | POA: Diagnosis not present

## 2022-08-06 DIAGNOSIS — R531 Weakness: Secondary | ICD-10-CM | POA: Diagnosis present

## 2022-08-06 DIAGNOSIS — K8301 Primary sclerosing cholangitis: Secondary | ICD-10-CM

## 2022-08-06 DIAGNOSIS — I10 Essential (primary) hypertension: Secondary | ICD-10-CM | POA: Diagnosis not present

## 2022-08-06 LAB — CBC
HCT: 40.7 % (ref 36.0–46.0)
Hemoglobin: 13.2 g/dL (ref 12.0–15.0)
MCH: 30.8 pg (ref 26.0–34.0)
MCHC: 32.4 g/dL (ref 30.0–36.0)
MCV: 94.9 fL (ref 80.0–100.0)
Platelets: 444 10*3/uL — ABNORMAL HIGH (ref 150–400)
RBC: 4.29 MIL/uL (ref 3.87–5.11)
RDW: 13.9 % (ref 11.5–15.5)
WBC: 10.7 10*3/uL — ABNORMAL HIGH (ref 4.0–10.5)
nRBC: 0 % (ref 0.0–0.2)

## 2022-08-06 LAB — URINALYSIS, ROUTINE W REFLEX MICROSCOPIC
Bilirubin Urine: NEGATIVE
Glucose, UA: NEGATIVE mg/dL
Hgb urine dipstick: NEGATIVE
Ketones, ur: 5 mg/dL — AB
Leukocytes,Ua: NEGATIVE
Nitrite: NEGATIVE
Protein, ur: NEGATIVE mg/dL
Specific Gravity, Urine: 1.017 (ref 1.005–1.030)
pH: 5 (ref 5.0–8.0)

## 2022-08-06 LAB — BASIC METABOLIC PANEL
Anion gap: 19 — ABNORMAL HIGH (ref 5–15)
BUN: 89 mg/dL — ABNORMAL HIGH (ref 8–23)
CO2: 19 mmol/L — ABNORMAL LOW (ref 22–32)
Calcium: 9 mg/dL (ref 8.9–10.3)
Chloride: 98 mmol/L (ref 98–111)
Creatinine, Ser: 4.07 mg/dL — ABNORMAL HIGH (ref 0.44–1.00)
GFR, Estimated: 11 mL/min — ABNORMAL LOW (ref >60)
Glucose, Bld: 109 mg/dL — ABNORMAL HIGH (ref 70–99)
Potassium: 3.8 mmol/L (ref 3.5–5.1)
Sodium: 136 mmol/L (ref 135–145)

## 2022-08-06 LAB — SODIUM, URINE, RANDOM: Sodium, Ur: 22 mmol/L

## 2022-08-06 LAB — HEPATIC FUNCTION PANEL
ALT: 10 U/L (ref 0–44)
AST: 10 U/L — ABNORMAL LOW (ref 15–41)
Albumin: 3.9 g/dL (ref 3.5–5.0)
Alkaline Phosphatase: 84 U/L (ref 38–126)
Bilirubin, Direct: <0.1 mg/dL (ref 0.0–0.2)
Total Bilirubin: 0.7 mg/dL (ref 0.3–1.2)
Total Protein: 10.5 g/dL — ABNORMAL HIGH (ref 6.5–8.1)

## 2022-08-06 LAB — SEDIMENTATION RATE: Sed Rate: 86 mm/hr — ABNORMAL HIGH (ref 0–30)

## 2022-08-06 LAB — CREATININE, URINE, RANDOM: Creatinine, Urine: 161 mg/dL

## 2022-08-06 LAB — LACTIC ACID, PLASMA
Lactic Acid, Venous: 1.3 mmol/L (ref 0.5–1.9)
Lactic Acid, Venous: 1.4 mmol/L (ref 0.5–1.9)

## 2022-08-06 MED ORDER — SODIUM CHLORIDE 0.9 % IV BOLUS
1000.0000 mL | Freq: Once | INTRAVENOUS | Status: AC
  Administered 2022-08-06: 1000 mL via INTRAVENOUS

## 2022-08-06 MED ORDER — ONDANSETRON HCL 4 MG PO TABS: 4.0000 mg | ORAL_TABLET | Freq: Four times a day (QID) | ORAL | Status: DC | PRN

## 2022-08-06 MED ORDER — SODIUM CHLORIDE 0.9 % IV SOLN: Freq: Once | INTRAVENOUS | Status: AC

## 2022-08-06 MED ORDER — HEPARIN SODIUM (PORCINE) 5000 UNIT/ML IJ SOLN
5000.0000 [IU] | Freq: Three times a day (TID) | INTRAMUSCULAR | Status: DC
  Administered 2022-08-06 – 2022-08-09 (×8): 5000 [IU] via SUBCUTANEOUS
  Filled 2022-08-06 (×8): qty 1

## 2022-08-06 MED ORDER — ONDANSETRON HCL 4 MG/2ML IJ SOLN: 4.0000 mg | Freq: Four times a day (QID) | INTRAMUSCULAR | Status: DC | PRN

## 2022-08-06 MED ORDER — ASPIRIN 81 MG PO TBEC
81.0000 mg | DELAYED_RELEASE_TABLET | Freq: Every day | ORAL | Status: DC
  Administered 2022-08-07 – 2022-08-09 (×3): 81 mg via ORAL
  Filled 2022-08-06 (×4): qty 1

## 2022-08-06 MED ORDER — POLYSACCHARIDE IRON COMPLEX 150 MG PO CAPS
150.0000 mg | ORAL_CAPSULE | Freq: Every day | ORAL | Status: DC
  Administered 2022-08-07 – 2022-08-09 (×3): 150 mg via ORAL
  Filled 2022-08-06 (×4): qty 1

## 2022-08-06 MED ORDER — ACETAMINOPHEN 650 MG RE SUPP: 650.0000 mg | Freq: Four times a day (QID) | RECTAL | Status: DC | PRN

## 2022-08-06 MED ORDER — SODIUM CHLORIDE 0.9 % IV SOLN
INTRAVENOUS | Status: DC
  Administered 2022-08-07: 1000 mL via INTRAVENOUS

## 2022-08-06 MED ORDER — ACETAMINOPHEN 325 MG PO TABS: 650.0000 mg | ORAL_TABLET | Freq: Four times a day (QID) | ORAL | Status: DC | PRN

## 2022-08-06 NOTE — H&P (Addendum)
History and Physical    Patient: Christy Summers ZOX:[id number] DOB: [birth year] DOA: 08/06/2022 DOS: the patient was seen and examined on 08/06/2022 PCP: Tracie Harrier, MD  Patient coming from: Home  Chief Complaint:  Chief Complaint  Patient presents with   Weakness   HPI: Christy Summers is a 71 y.o. female with medical history significant for Crohn's disease, history of C. difficile colitis, hypertension who was sent from the infusion center to the emergency room for evaluation of weakness. Patient states that she was recently started on IV infusion for her Crohn's disease and had gone for her second treatment today but was too weak to receive the infusion and was sent to the ER. She has a history of chronic diarrhea and has about 3-4 stools daily but is said to have had increased frequency of diarrhea stools over the last 1 week associated with poor oral intake.  She complains of feeling very weak and has been holding onto surfaces when she ambulates to prevent her from falling.  She denies feeling dizzy or lightheaded.  She denies having any nausea or vomiting.  She notes that she has had a decrease in her urine output. She denies having any fever, no chills, no headache, no blurred vision, no focal deficits, no urinary symptoms or focal deficit. Labs show a serum creatinine of 4.07 compared to baseline of 1.0 as well as elevated BUN. She will be admitted to the hospital for further evaluation   Review of Systems: As mentioned in the history of present illness. All other systems reviewed and are negative. Past Medical History:  Diagnosis Date   Anemia    normocytic   Bilateral ovarian cysts    only right ovarian cyst   Chronic diarrhea    Elevated lipids    History of Clostridioides difficile colitis 04/2020   pt. has been positive and negative since 2017. most recent result was negative. diarrhea persists   Hypertension    Past Surgical History:  Procedure Laterality  Date   CHOLECYSTECTOMY     COLONOSCOPY WITH PROPOFOL N/A 09/07/2017   Procedure: COLONOSCOPY WITH PROPOFOL;  Surgeon: Toledo, Benay Pike, MD;  Location: [facility] ENDOSCOPY;  Service: Endoscopy;  Laterality: N/A;   COLONOSCOPY WITH PROPOFOL N/A 01/22/2019   Procedure: COLONOSCOPY WITH PROPOFOL;  Surgeon: Manya Silvas, MD;  Location: [facility] ENDOSCOPY;  Service: Endoscopy;  Laterality: N/A;   COLONOSCOPY WITH PROPOFOL N/A 09/21/2021   Procedure: COLONOSCOPY WITH PROPOFOL;  Surgeon: Lesly Rubenstein, MD;  Location: [facility] ENDOSCOPY;  Service: Endoscopy;  Laterality: N/A;   ESOPHAGOGASTRODUODENOSCOPY (EGD) WITH PROPOFOL N/A 09/07/2017   Procedure: ESOPHAGOGASTRODUODENOSCOPY (EGD) WITH PROPOFOL;  Surgeon: Toledo, Benay Pike, MD;  Location: [facility] ENDOSCOPY;  Service: Endoscopy;  Laterality: N/A;   TOTAL LAPAROSCOPIC HYSTERECTOMY WITH BILATERAL SALPINGO OOPHORECTOMY N/A 04/30/2020   Procedure: HYSTERECTOMY TOTAL LAPAROSCOPIC/BILATERAL SALPINGO OOPHORECTOMY POSSIBLE PELVIC/AORTIC LYMPH NODE DISSECTION, OMENTECTOMY, LAPAROTOMY;  Surgeon: Mellody Drown, MD;  Location: [facility] ORS;  Service: Gynecology;  Laterality: N/A;   Social History:  reports that she has never smoked. She has never used smokeless tobacco. She reports that she does not currently use alcohol. She reports that she does not currently use drugs.  Allergies  Allergen Reactions   Mesalamine Diarrhea and Nausea Only    Family History  Problem Relation Age of Onset   Hypertension Mother    Stroke Mother    Stroke Sister    Hypertension Sister    Hypertension Brother    Breast cancer Neg Hx  Prior to Admission medications   Medication Sig Start Date End Date Taking? Authorizing Provider  amLODipine (NORVASC) 10 MG tablet Take 10 mg by mouth daily.    [provider]  Ascorbic Acid (VITAMIN C) 500 MG CAPS Take 500 mg by mouth daily.     [provider]  aspirin 81 MG EC tablet Take 81 mg by mouth daily.     [provider]  calcium elemental as carbonate (TUMS ULTRA 1000) 400 MG chewable tablet Chew 1,000 mg by mouth daily.    [provider]  Cholecalciferol 25 MCG (1000 UT) tablet Take 1,000 Units by mouth daily.     [provider]  COVID-19 mRNA bivalent vaccine, Pfizer, (PFIZER COVID-19 VAC BIVALENT) injection Inject into the muscle. 09/22/21   Carlyle Basques, MD  cyanocobalamin 1000 MCG tablet Take 1,000 mcg by mouth daily.    [provider]  iron polysaccharides (NIFEREX) 150 MG capsule Take 150 mg by mouth daily.  12/27/18 04/24/20  [provider]  loperamide (IMODIUM A-D) 2 MG tablet Take 2 mg by mouth 4 (four) times daily as needed for diarrhea or loose stools.    [provider]  losartan-hydrochlorothiazide (HYZAAR) 100-25 MG tablet Take 1 tablet by mouth daily.    [provider]  potassium chloride (K-DUR) 10 MEQ tablet Take 10 mEq by mouth 2 (two) times daily.     [provider]    Physical Exam: Vitals:   08/06/22 1428 08/06/22 1429 08/06/22 1615 08/06/22 1735  BP: 118/68  (!) 125/55 129/66  Pulse: 84  75 77  Resp: '18  18 20  '$ Temp: 97.9 F (36.6 C)     TempSrc: Oral     SpO2: 95%  100% 96%  Weight:  96.2 kg    Height:  '5\' 6"'$  (1.676 m)     Physical Exam Constitutional:      Comments: Chronically ill-appearing  HENT:     Head: Normocephalic and atraumatic.     Nose: Nose normal.     Mouth/Throat:     Mouth: Mucous membranes are dry.  Eyes:     Comments: Pale conjunctiva  Cardiovascular:     Rate and Rhythm: Normal rate and regular rhythm.  Pulmonary:     Effort: Pulmonary effort is normal.     Breath sounds: Normal breath sounds.  Abdominal:     General: Abdomen is flat. Bowel sounds are normal.     Palpations: Abdomen is soft.  Musculoskeletal:        General: Normal range of motion.     Cervical back: Normal range of motion and neck supple.  Skin:    General: Skin is warm and dry.  Neurological:      Mental Status: She is alert.     Motor: Weakness present.  Psychiatric:        Mood and Affect: Mood normal.        Behavior: Behavior normal.     Data Reviewed: Relevant notes from primary care and specialist visits, past discharge summaries as available in EHR, including Care Everywhere. Prior diagnostic testing as pertinent to current admission diagnoses Updated medications and problem lists for reconciliation ED course, including vitals, labs, imaging, treatment and response to treatment Triage notes, nursing and pharmacy notes and ED provider's notes Notable results as noted in HPI Labs reviewed.  Lactic acid 1.3, total protein 10.5, albumin 3.9, AST 10, ALT 10, alkaline phosphatase 84, total bilirubin 0.7, sodium 136, potassium 3.8,  chloride 98, bicarb 19, glucose 109, BUN 89 compared to baseline of 21, serum creatinine 4.07 compared to baseline of 1.0, calcium 9.0, white count 10.7, hemoglobin 13.2, hematocrit 40.7, platelet count 444 X-ray of the abdomen and pelvis shows no acute cardiopulmonary disease. nonobstructive bowel gas pattern.Aortic Atherosclerosis  Twelve-lead EKG shows normal sinus rhythm with LVH. There are no new results to review at this time.  Assessment and Plan: * AKI (acute kidney injury) ([place]) Secondary to GI losses from worsening diarrhea Patient noted to have worsening renal function with serum creatinine of 4 compared to baseline of 1.0 as well as elevated BUN of 81 compared to baseline of 21 Patient noted to have dry mucous membranes and poor skin turgor Hold losartan/HCTZ Aggressive IV fluid resuscitation Obtain renal ultrasound Consult nephrology  Crohn's disease [facility]) Patient with a history of Crohn's disease associated with chronic diarrhea who presents for evaluation of worsening diarrhea. She has a history of C. difficile colitis Send stool for C. difficile toxin as well as stool PCR GI consult  Hypertension Hold antihypertensive  medications for now since patient is normotensive Hold losartan/HCTZ due to AKI      Advance Care Planning:   Code Status: Full Code   Consults: Nephrology/gastroenterology  Family Communication: Greater than 50% of time was spent discussing patient's condition and plan of care with her at the bedside.  All questions and concerns have been addressed.  She verbalizes understanding and agrees with the plan.  Severity of Illness: The appropriate patient status for this patient is INPATIENT. Inpatient status is judged to be reasonable and necessary in order to provide the required intensity of service to ensure the patient's safety. The patient's presenting symptoms, physical exam findings, and initial radiographic and laboratory data in the context of their chronic comorbidities is felt to place them at high risk for further clinical deterioration. Furthermore, it is not anticipated that the patient will be medically stable for discharge from the hospital within 2 midnights of admission.   * I certify that at the point of admission it is my clinical judgment that the patient will require inpatient hospital care spanning beyond 2 midnights from the point of admission due to high intensity of service, high risk for further deterioration and high frequency of surveillance required.*  Author: Collier Bullock, MD 08/06/2022 6:10 PM  For on call review [email or web address].

## 2022-08-06 NOTE — Progress Notes (Signed)
Patient arrived on unit alert, oriented and without acute distress. Patient settled into room, vital signs obtained, admission assessment completed. Will continue to monitor as per doctors orders and plan of care.

## 2022-08-06 NOTE — ED Notes (Signed)
The pt is warm, pink, and dry. The pt does appear to be tired but is alert and oriented x 4.

## 2022-08-06 NOTE — ED Triage Notes (Signed)
Patient states she was brought over from the infusion clinic stating she was too weak for her treatment. Patient was to receive injection for her crohns. Patient c/o weakness since last Thursday. Patient states she does not feel like herself.

## 2022-08-06 NOTE — ED Provider Notes (Signed)
[facility] Provider Note    Event Date/Time   First MD Initiated Contact with Patient 08/06/22 1550     (approximate)   History   Weakness   HPI  Christy Summers is a 71 y.o. female who presents to the ER for evaluation of feeling unwell over the past several days to week having increased and nonbloody diarrhea in the setting of her known Crohn's.  She was at outpatient infusion center today told her about her symptoms and was sent to the ER for further evaluation.  States that she has not peed in the past 24 hours.  States that she is drinking water but unable to keep up.  Denies any fevers or chills no abdominal pain.     Physical Exam   Triage Vital Signs: ED Triage Vitals  Enc Vitals Group     BP 08/06/22 1428 118/68     Pulse Rate 08/06/22 1428 84     Resp 08/06/22 1428 18     Temp 08/06/22 1428 97.9 F (36.6 C)     Temp Source 08/06/22 1428 Oral     SpO2 08/06/22 1428 95 %     Weight 08/06/22 1429 212 lb (96.2 kg)     Height 08/06/22 1429 '5\' 6"'$  (1.676 m)     Head Circumference --      Peak Flow --      Pain Score --      Pain Loc --      Pain Edu? --      Excl. in [place]? --     Most recent vital signs: Vitals:   08/06/22 1428 08/06/22 1615  BP: 118/68 (!) 125/55  Pulse: 84 75  Resp: 18 18  Temp: 97.9 F (36.6 C)   SpO2: 95% 100%     Constitutional: Alert  Eyes: Conjunctivae are normal.  Head: Atraumatic. Nose: No congestion/rhinnorhea. Mouth/Throat: Mucous membranes are dry   Neck: Painless ROM.  Cardiovascular:   Good peripheral circulation. Respiratory: Normal respiratory effort.  No retractions.  Gastrointestinal: Soft and nontender in all four quadrants  Musculoskeletal:  no deformity Neurologic:  MAE spontaneously. No gross focal neurologic deficits are appreciated.  Skin:  Skin is warm, dry and intact. No rash noted. Psychiatric: Mood and affect are normal. Speech and behavior are normal.    ED Results /  Procedures / Treatments   Labs (all labs ordered are listed, but only abnormal results are displayed) Labs Reviewed  BASIC METABOLIC PANEL - Abnormal; Notable for the following components:      Result Value   CO2 19 (*)    Glucose, Bld 109 (*)    BUN 89 (*)    Creatinine, Ser 4.07 (*)    GFR, Estimated 11 (*)    Anion gap 19 (*)    All other components within normal limits  CBC - Abnormal; Notable for the following components:   WBC 10.7 (*)    Platelets 444 (*)    All other components within normal limits  HEPATIC FUNCTION PANEL - Abnormal; Notable for the following components:   Total Protein 10.5 (*)    AST 10 (*)    All other components within normal limits  URINALYSIS, ROUTINE W REFLEX MICROSCOPIC  LACTIC ACID, PLASMA  LACTIC ACID, PLASMA     EKG  ED ECG REPORT I, Merlyn Lot, the attending physician, personally viewed and interpreted this ECG.   Date: 08/06/2022  EKG Time: 14:32  Rate: 80  Rhythm:  sinus  Axis:   Intervals: normal  ST&T Change: no stemi, no depressions    RADIOLOGY Please see ED Course for my review and interpretation.  I personally reviewed all radiographic images ordered to evaluate for the above acute complaints and reviewed radiology reports and findings.  These findings were personally discussed with the patient.  Please see medical record for radiology report.    PROCEDURES:  Critical Care performed:   Procedures   MEDICATIONS ORDERED IN ED: Medications  sodium chloride 0.9 % bolus 1,000 mL (has no administration in time range)  0.9 %  sodium chloride infusion (has no administration in time range)     IMPRESSION / MDM / ASSESSMENT AND PLAN / ED COURSE  I reviewed the triage vital signs and the nursing notes.                              Differential diagnosis includes, but is not limited to, duration, electrolyte abnormality, colitis, SBO, dehydration, anemia  Patient presenting to the ER for evaluation of  symptoms as described above.  This presenting complaint could reflect a potentially life-threatening illness therefore the patient will be placed on continuous pulse oximetry and telemetry for monitoring.  Laboratory evaluation will be sent to evaluate for the above complaints.   Patient with significant AKI.  Will order IV fluids.  Her abdominal exam is soft benign.  Patient will require hospitalization for IV fluids.  I have given IV fluid boluses.  Does not appear clinically septic.   Clinical Course as of 08/06/22 1739  Fri Aug 06, 2022  1726 Today my review and interpretation does not show any evidence of free air or consolidation will await formal radiology report. [PR]    Clinical Course User Index [PR] Merlyn Lot, MD   ----------------------------------------- 5:39 PM on 08/06/2022 ----------------------------------------- Have consulted hospitalist for admission.  Remains hemodynamically stable.  Patient's presentation is most consistent with    FINAL CLINICAL IMPRESSION(S) / ED DIAGNOSES   Final diagnoses:  AKI (acute kidney injury) ([place])     Rx / DC Orders   ED Discharge Orders     None        Note:  This document was prepared using Dragon voice recognition software and may include unintentional dictation errors.    Merlyn Lot, MD 08/06/22 1740

## 2022-08-06 NOTE — Assessment & Plan Note (Addendum)
Secondary to GI losses from worsening diarrhea Patient noted to have worsening renal function with serum creatinine of 4 compared to baseline of 1.0 as well as elevated BUN of 81 compared to baseline of 21 Patient noted to have dry mucous membranes and poor skin turgor Hold losartan/HCTZ Aggressive IV fluid resuscitation Obtain renal ultrasound Consult nephrology

## 2022-08-06 NOTE — Assessment & Plan Note (Signed)
Holding antihypertensive medications currently.  Current blood pressure stable.

## 2022-08-06 NOTE — Assessment & Plan Note (Signed)
Patient with a history of Crohn's disease associated with chronic diarrhea who presents for evaluation of worsening diarrhea. She has a history of C. difficile colitis Send stool for C. difficile toxin as well as stool PCR GI consult

## 2022-08-06 NOTE — ED Notes (Signed)
Informed RN bed assigned 

## 2022-08-06 NOTE — ED Notes (Signed)
23 yof with a c/c of not feeling well or feeling like herself after getting an infusion today.

## 2022-08-07 ENCOUNTER — Inpatient Hospital Stay: Payer: PPO

## 2022-08-07 DIAGNOSIS — K8301 Primary sclerosing cholangitis: Secondary | ICD-10-CM

## 2022-08-07 DIAGNOSIS — N179 Acute kidney failure, unspecified: Secondary | ICD-10-CM | POA: Diagnosis not present

## 2022-08-07 DIAGNOSIS — I10 Essential (primary) hypertension: Secondary | ICD-10-CM | POA: Diagnosis not present

## 2022-08-07 DIAGNOSIS — K50919 Crohn's disease, unspecified, with unspecified complications: Secondary | ICD-10-CM

## 2022-08-07 DIAGNOSIS — A0472 Enterocolitis due to Clostridium difficile, not specified as recurrent: Secondary | ICD-10-CM

## 2022-08-07 DIAGNOSIS — R778 Other specified abnormalities of plasma proteins: Secondary | ICD-10-CM

## 2022-08-07 LAB — GASTROINTESTINAL PANEL BY PCR, STOOL (REPLACES STOOL CULTURE)

## 2022-08-07 LAB — C-REACTIVE PROTEIN: CRP: 3.2 mg/dL — ABNORMAL HIGH (ref <1.0)

## 2022-08-07 LAB — CBC
HCT: 36.1 % (ref 36.0–46.0)
Hemoglobin: 11.6 g/dL — ABNORMAL LOW (ref 12.0–15.0)
MCH: 30.4 pg (ref 26.0–34.0)
MCHC: 32.1 g/dL (ref 30.0–36.0)
MCV: 94.5 fL (ref 80.0–100.0)
Platelets: 374 10*3/uL (ref 150–400)
RBC: 3.82 MIL/uL — ABNORMAL LOW (ref 3.87–5.11)
RDW: 13.7 % (ref 11.5–15.5)
WBC: 10.8 10*3/uL — ABNORMAL HIGH (ref 4.0–10.5)
nRBC: 0 % (ref 0.0–0.2)

## 2022-08-07 LAB — BASIC METABOLIC PANEL
Anion gap: 9 (ref 5–15)
BUN: 62 mg/dL — ABNORMAL HIGH (ref 8–23)
CO2: 22 mmol/L (ref 22–32)
Calcium: 8.1 mg/dL — ABNORMAL LOW (ref 8.9–10.3)
Chloride: 109 mmol/L (ref 98–111)
Creatinine, Ser: 1.58 mg/dL — ABNORMAL HIGH (ref 0.44–1.00)
GFR, Estimated: 35 mL/min — ABNORMAL LOW (ref >60)
Glucose, Bld: 98 mg/dL (ref 70–99)
Potassium: 3.2 mmol/L — ABNORMAL LOW (ref 3.5–5.1)
Sodium: 140 mmol/L (ref 135–145)

## 2022-08-07 LAB — C DIFFICILE QUICK SCREEN W PCR REFLEX
C Diff antigen: POSITIVE — AB
C Diff interpretation: DETECTED
C Diff toxin: POSITIVE — AB

## 2022-08-07 LAB — HIV ANTIBODY (ROUTINE TESTING W REFLEX): HIV Screen 4th Generation wRfx: NONREACTIVE

## 2022-08-07 MED ORDER — POTASSIUM CHLORIDE CRYS ER 20 MEQ PO TBCR
20.0000 meq | EXTENDED_RELEASE_TABLET | Freq: Once | ORAL | Status: AC
Start: 2022-08-07 — End: 2022-08-07
  Administered 2022-08-07: 20 meq via ORAL
  Filled 2022-08-07: qty 1

## 2022-08-07 MED ORDER — VANCOMYCIN HCL 125 MG PO CAPS
125.0000 mg | ORAL_CAPSULE | Freq: Four times a day (QID) | ORAL | Status: DC
  Administered 2022-08-07 – 2022-08-08 (×7): 125 mg via ORAL
  Filled 2022-08-07 (×9): qty 1

## 2022-08-07 NOTE — Assessment & Plan Note (Addendum)
C. difficile testing came back positive on 08/06/2022.  Patient initially started on p.o. vancomycin.  Switched over to Dificid for 10-day course.  Follow-up with gastroenterology as outpatient.  Patient still having some diarrhea but wanted to go home.  She does have Crohn's disease also.

## 2022-08-07 NOTE — Assessment & Plan Note (Signed)
serum protein electrophoresis pending

## 2022-08-07 NOTE — Plan of Care (Signed)

## 2022-08-07 NOTE — Progress Notes (Signed)
  Progress Note   Patient: Christy Summers TFT:[id number] DOB: 03-31-1951 DOA: 08/06/2022     1 DOS: the patient was seen and examined on 08/07/2022    Assessment and Plan: * C. difficile colitis C. difficile testing came back positive on 08/07/2022.  Start p.o. vancomycin.  AKI (acute kidney injury) ([place]) Creatinine 4.07 on presentation down to 1.58 with IV fluid hydration.  Recent creatinine on 07/26/2022 is 0.9.  Hypertension Holding antihypertensive medications currently.  Current blood pressure stable.  Crohn's disease [facility]) Case discussed with gastroenterology and no indications for steroids at this point.  Had a recent Remicade infusion as outpatient.  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  Elevated total protein Send off serum protein electrophoresis.        Subjective: Patient had some diarrhea prior to coming into the hospital. No abdominal pain, no nausea or vomiting.  Admitted with dehydration and creatinine going above 4.  Physical Exam: Vitals:   08/06/22 2007 08/07/22 0044 08/07/22 0558 08/07/22 0933  BP: 119/76 130/70 113/65 130/73  Pulse: 85 71 67 72  Resp: $Remo'18 18 16 18  'rhUjx$ Temp: 97.6 F (36.4 C) 97.9 F (36.6 C) 97.8 F (36.6 C) 97.7 F (36.5 C)  TempSrc: Oral   Oral  SpO2: 100% 100% 100% 100%  Weight:      Height:       Physical Exam HENT:     Head: Normocephalic.     Mouth/Throat:     Pharynx: No oropharyngeal exudate.  Eyes:     General: Lids are normal.     Conjunctiva/sclera: Conjunctivae normal.  Cardiovascular:     Rate and Rhythm: Normal rate and regular rhythm.     Heart sounds: Normal heart sounds, S1 normal and S2 normal.  Pulmonary:     Breath sounds: No decreased breath sounds, wheezing, rhonchi or rales.  Abdominal:     Palpations: Abdomen is soft.     Tenderness: There is no abdominal tenderness.  Musculoskeletal:     Right lower leg: No swelling.     Left lower leg: No swelling.   Skin:    General: Skin is warm.     Findings: No rash.  Neurological:     Mental Status: She is alert and oriented to person, place, and time.     Data Reviewed: C. difficile testing positive. Creatinine 4.07 down to 1.58, potassium 3.2 White blood cell count 10.8, hemoglobin 11.6, platelet count 374 CRP 3.2, ESR 86  Family Communication: Deferred  Disposition: Status is: Inpatient Remains inpatient appropriate because: Now being treated for C. difficile colitis and also acute kidney injury.  Planned Discharge Destination: Home    Time spent: 28 minutes Case discussed with gastroenterology  Author: Loletha Grayer, MD 08/07/2022 3:13 PM  For on call review [email or web address].

## 2022-08-07 NOTE — Assessment & Plan Note (Signed)
Follow-up with gastroenterology as outpatient.

## 2022-08-07 NOTE — Consult Note (Signed)
Inpatient Consultation   Patient ID: Christy Summers is a 71 y.o. female.  Requesting Provider: Collier Bullock, MD  Date of Admission: 08/06/2022  Date of Consult: 08/07/22   Reason for Consultation: crohns disease   Patient's Chief Complaint:   Chief Complaint  Patient presents with   Weakness    71 y/o AAF c ileocolonic Crohns disease (dx 07/2020), PSC (no cirrhosis) who presents to the hospital with worsening weakness and malaise. GI is consulted for Crohns disease.  Pt was seen in infusion and was supposed to receive her second inflectra dose, however, she was feeling ill/weak over the last several days and was instructed to present to ED. She noted poor po intake over the last several days. Creatine was noted to be 4 on presentation (baseline 1). Pt also noted decreased urine output. In d/w patient- she notes 1 loose BM per day leading up to hospitalization. No melena, hematochezia, n/v, abdominal pain. She denies fever, chills, chest pain, sob, cough.  Completed budesonide and PO vancomycin courses in June/July Tolerated first infusion dose w/o issue.  Failed entyvio. Started on inflectra on 8/4. Second induction dose due 8/18 not received due to feeling ill  Denies NSAIDs, Anti-plt agents, and anticoagulants Denies family history of gastrointestinal disease and malignancy Previous Endoscopies: Most recent colonoscopy 09/2021 with active crohns disease -Congested TI - enteritis - Inflamed colon with chronic colitis on biopsies - Tested positive for C. Diff after procedure   Past Medical History:  Diagnosis Date   Anemia    normocytic   Bilateral ovarian cysts    only right ovarian cyst   Chronic diarrhea    Elevated lipids    History of Clostridioides difficile colitis 04/2020   pt. has been positive and negative since 2017. most recent result was negative. diarrhea persists   Hypertension     Past Surgical History:  Procedure Laterality Date    CHOLECYSTECTOMY     COLONOSCOPY WITH PROPOFOL N/A 09/07/2017   Procedure: COLONOSCOPY WITH PROPOFOL;  Surgeon: Toledo, Benay Pike, MD;  Location: [facility] ENDOSCOPY;  Service: Endoscopy;  Laterality: N/A;   COLONOSCOPY WITH PROPOFOL N/A 01/22/2019   Procedure: COLONOSCOPY WITH PROPOFOL;  Surgeon: Manya Silvas, MD;  Location: [facility] ENDOSCOPY;  Service: Endoscopy;  Laterality: N/A;   COLONOSCOPY WITH PROPOFOL N/A 09/21/2021   Procedure: COLONOSCOPY WITH PROPOFOL;  Surgeon: Lesly Rubenstein, MD;  Location: [facility] ENDOSCOPY;  Service: Endoscopy;  Laterality: N/A;   ESOPHAGOGASTRODUODENOSCOPY (EGD) WITH PROPOFOL N/A 09/07/2017   Procedure: ESOPHAGOGASTRODUODENOSCOPY (EGD) WITH PROPOFOL;  Surgeon: Toledo, Benay Pike, MD;  Location: [facility] ENDOSCOPY;  Service: Endoscopy;  Laterality: N/A;   TOTAL LAPAROSCOPIC HYSTERECTOMY WITH BILATERAL SALPINGO OOPHORECTOMY N/A 04/30/2020   Procedure: HYSTERECTOMY TOTAL LAPAROSCOPIC/BILATERAL SALPINGO OOPHORECTOMY POSSIBLE PELVIC/AORTIC LYMPH NODE DISSECTION, OMENTECTOMY, LAPAROTOMY;  Surgeon: Mellody Drown, MD;  Location: [facility] ORS;  Service: Gynecology;  Laterality: N/A;    Allergies  Allergen Reactions   Mesalamine Diarrhea and Nausea Only    Family History  Problem Relation Age of Onset   Hypertension Mother    Stroke Mother    Stroke Sister    Hypertension Sister    Hypertension Brother    Breast cancer Neg Hx     Social History   Tobacco Use   Smoking status: Never   Smokeless tobacco: Never  Vaping Use   Vaping Use: Never used  Substance Use Topics   Alcohol use: Not Currently    Comment: rare sweet drink   Drug use: Not Currently  Pertinent GI related history and allergies were reviewed with the patient  Review of Systems  Constitutional:  Positive for activity change, appetite change and fatigue. Negative for chills, diaphoresis, fever and unexpected weight change.  HENT:  Negative for trouble swallowing and voice change.   Respiratory:   Negative for shortness of breath and wheezing.   Cardiovascular:  Negative for chest pain, palpitations and leg swelling.  Gastrointestinal:  Negative for abdominal distention, abdominal pain, anal bleeding, blood in stool, constipation, diarrhea, nausea, rectal pain and vomiting.  Musculoskeletal:  Negative for arthralgias and myalgias.  Skin:  Negative for color change and pallor.  Neurological:  Positive for weakness. Negative for dizziness and syncope.  Psychiatric/Behavioral:  Negative for agitation and confusion.   All other systems reviewed and are negative.    Medications Home Medications No current facility-administered medications on file prior to encounter.   Current Outpatient Medications on File Prior to Encounter  Medication Sig Dispense Refill   amLODipine (NORVASC) 10 MG tablet Take 10 mg by mouth daily.     Ascorbic Acid (VITAMIN C) 500 MG CAPS Take 500 mg by mouth daily.      aspirin 81 MG EC tablet Take 81 mg by mouth daily.      calcium elemental as carbonate (TUMS ULTRA 1000) 400 MG chewable tablet Chew 1,000 mg by mouth daily.     Cholecalciferol 25 MCG (1000 UT) tablet Take 1,000 Units by mouth daily.      cyanocobalamin 1000 MCG tablet Take 1,000 mcg by mouth daily.     iron polysaccharides (NIFEREX) 150 MG capsule Take 150 mg by mouth daily.      losartan-hydrochlorothiazide (HYZAAR) 100-25 MG tablet Take 1 tablet by mouth daily.     potassium chloride (K-DUR) 10 MEQ tablet Take 10 mEq by mouth 2 (two) times daily.      COVID-19 mRNA bivalent vaccine, Pfizer, (PFIZER COVID-19 VAC BIVALENT) injection Inject into the muscle. 0.3 mL 0   loperamide (IMODIUM A-D) 2 MG tablet Take 2 mg by mouth 4 (four) times daily as needed for diarrhea or loose stools.     Pertinent GI related medications were reviewed with the patient  Inpatient Medications  Current Facility-Administered Medications:    0.9 %  sodium chloride infusion, , Intravenous, Continuous, Wieting,  Richard, MD, Last Rate: 125 mL/hr at 08/07/22 0300, 1,000 mL at 08/07/22 0300   acetaminophen (TYLENOL) tablet 650 mg, 650 mg, Oral, Q6H PRN **OR** acetaminophen (TYLENOL) suppository 650 mg, 650 mg, Rectal, Q6H PRN, Agbata, Tochukwu, MD   aspirin EC tablet 81 mg, 81 mg, Oral, Daily, Agbata, Tochukwu, MD, 81 mg at 08/07/22 0936   heparin injection 5,000 Units, 5,000 Units, Subcutaneous, Q8H, Agbata, Tochukwu, MD, 5,000 Units at 08/07/22 0607   iron polysaccharides (NIFEREX) capsule 150 mg, 150 mg, Oral, Daily, Agbata, Tochukwu, MD, 150 mg at 08/07/22 0937   ondansetron (ZOFRAN) tablet 4 mg, 4 mg, Oral, Q6H PRN **OR** ondansetron (ZOFRAN) injection 4 mg, 4 mg, Intravenous, Q6H PRN, Agbata, Tochukwu, MD   potassium chloride SA (KLOR-CON M) CR tablet 20 mEq, 20 mEq, Oral, Once, Wieting, Richard, MD  sodium chloride 1,000 mL (08/07/22 0300)    acetaminophen **OR** acetaminophen, ondansetron **OR** ondansetron (ZOFRAN) IV   Objective   Vitals:   08/06/22 2007 08/07/22 0044 08/07/22 0558 08/07/22 0933  BP: 119/76 130/70 113/65 130/73  Pulse: 85 71 67 72  Resp: '18 18 16 18  '$ Temp: 97.6 F (36.4 C) 97.9 F (36.6 C) 97.8 F (  36.6 C) 97.7 F (36.5 C)  TempSrc: Oral   Oral  SpO2: 100% 100% 100% 100%  Weight:      Height:         Physical Exam Vitals and nursing note reviewed.  Constitutional:      General: She is not in acute distress.    Appearance: She is ill-appearing. She is not toxic-appearing or diaphoretic.  HENT:     Head: Normocephalic and atraumatic.     Nose: Nose normal.     Mouth/Throat:     Mouth: Mucous membranes are moist.     Pharynx: Oropharynx is clear.  Eyes:     General: No scleral icterus.    Extraocular Movements: Extraocular movements intact.  Cardiovascular:     Rate and Rhythm: Normal rate and regular rhythm.     Heart sounds: Normal heart sounds. No murmur heard.    No friction rub. No gallop.  Pulmonary:     Effort: Pulmonary effort is normal. No  respiratory distress.     Breath sounds: Normal breath sounds. No wheezing, rhonchi or rales.  Abdominal:     General: Bowel sounds are normal. There is no distension.     Palpations: Abdomen is soft.     Tenderness: There is no abdominal tenderness. There is no guarding or rebound.  Musculoskeletal:     Cervical back: Neck supple.     Right lower leg: No edema.     Left lower leg: No edema.  Skin:    General: Skin is warm and dry.     Coloration: Skin is not jaundiced or pale.  Neurological:     General: No focal deficit present.     Mental Status: She is alert and oriented to person, place, and time. Mental status is at baseline.  Psychiatric:        Mood and Affect: Mood normal.        Behavior: Behavior normal.        Thought Content: Thought content normal.        Judgment: Judgment normal.     Laboratory Data Recent Labs  Lab 08/06/22 1432 08/07/22 0659  WBC 10.7* 10.8*  HGB 13.2 11.6*  HCT 40.7 36.1  PLT 444* 374   Recent Labs  Lab 08/06/22 1432 08/06/22 1434 08/07/22 0659  NA 136  --  140  K 3.8  --  3.2*  CL 98  --  109  CO2 19*  --  22  BUN 89*  --  62*  CALCIUM 9.0  --  8.1*  PROT  --  10.5*  --   BILITOT  --  0.7  --   ALKPHOS  --  84  --   ALT  --  10  --   AST  --  10*  --   GLUCOSE 109*  --  98   No results for input(s): "INR" in the last 168 hours.  No results for input(s): "LIPASE" in the last 72 hours.      Imaging Studies: DG Abdomen Acute W/Chest  Result Date: 08/06/2022 CLINICAL DATA:  eval for obstructive pattern EXAM: DG ABDOMEN ACUTE WITH 1 VIEW CHEST COMPARISON:  CT abdomen pelvis 08/28/2020 FINDINGS: The heart and mediastinal contours are within normal limits. No focal consolidation. No pulmonary edema. No pleural effusion. No pneumothorax. Right upper quadrant surgical clips. There is no evidence of dilated bowel loops or free intraperitoneal air. No radiopaque calculi or other significant radiographic abnormality is seen. No  acute osseous abnormality. IMPRESSION: 1. No acute cardiopulmonary disease. 2. Nonobstructive bowel gas pattern. 3.  Aortic Atherosclerosis (ICD10-I70.0). Electronically Signed   By: Iven Finn M.D.   On: 08/06/2022 17:27    Assessment:   # AKI - prerenal by FeNA 0.4% - Creatinine went up to 4 on presentation, now down to 1.5 (baseline 1) - in speaking with patient, poor po intake - on lisinopril/hctz at home - lactate 1.4  # Crohns disease - pt was scheduled to receive 2nd loading dose of inflectra yesterday, but did not receive due to  - BM have not increased. In fact, she has not been able to have a BM since arriving in hospital. Leading to hospitalization, she was having 1-2 loose to formed BM per day  # PSC - Lfts wnl  # Normocytic anemia -  hgb 11.5 - at baseline - no signs of gib  # h/o c diff - Positive carrier status - no recent abx or steroids  Plan:   Will plan for inflectra with primary GI as outpatient so she can stay on schedule with her induction Continue with IVF resuscitation No indication for steroids at this time Stool studies have yet to be collected as pt has not had a BM No leukocytosis. Mildly elevated inflammation markers- expected given she's undergoing therapy induction No current indication for endoscopic evaluation Pt is c diff carrier, testing will need careful interpretation (completed po vanco course in June/July and currently w/o diarrhea, fever, abdominal pain, or leukocytosis) Symptomatically feeling better with resuscitation Advance diet as tolerated Labs and imaging reviewed Electrolyte correction as per primary team Consider other acute illness for weakness/fatigue  GI available as needed  I personally performed the service.  Management of other medical comorbidities as per primary team  Thank you for allowing [place] to participate in this patient's care. Please don't hesitate to call if any questions or concerns arise.   Annamaria Helling, DO [facility]  Portions of the record may have been created with voice recognition software. Occasional wrong-word or 'sound-a-like' substitutions may have occurred due to the inherent limitations of voice recognition software.  Read the chart carefully and recognize, using context, where substitutions may have occurred.

## 2022-08-08 DIAGNOSIS — A0472 Enterocolitis due to Clostridium difficile, not specified as recurrent: Secondary | ICD-10-CM | POA: Diagnosis not present

## 2022-08-08 DIAGNOSIS — I10 Essential (primary) hypertension: Secondary | ICD-10-CM | POA: Diagnosis not present

## 2022-08-08 DIAGNOSIS — N179 Acute kidney failure, unspecified: Secondary | ICD-10-CM | POA: Diagnosis not present

## 2022-08-08 DIAGNOSIS — K50919 Crohn's disease, unspecified, with unspecified complications: Secondary | ICD-10-CM | POA: Diagnosis not present

## 2022-08-08 DIAGNOSIS — E876 Hypokalemia: Secondary | ICD-10-CM

## 2022-08-08 LAB — CBC
HCT: 34.4 % — ABNORMAL LOW (ref 36.0–46.0)
Hemoglobin: 11 g/dL — ABNORMAL LOW (ref 12.0–15.0)
MCH: 30.8 pg (ref 26.0–34.0)
MCHC: 32 g/dL (ref 30.0–36.0)
MCV: 96.4 fL (ref 80.0–100.0)
Platelets: 398 10*3/uL (ref 150–400)
RBC: 3.57 MIL/uL — ABNORMAL LOW (ref 3.87–5.11)
RDW: 13.4 % (ref 11.5–15.5)
WBC: 15.6 10*3/uL — ABNORMAL HIGH (ref 4.0–10.5)
nRBC: 0 % (ref 0.0–0.2)

## 2022-08-08 LAB — BASIC METABOLIC PANEL
Anion gap: 4 — ABNORMAL LOW (ref 5–15)
BUN: 26 mg/dL — ABNORMAL HIGH (ref 8–23)
CO2: 24 mmol/L (ref 22–32)
Calcium: 8.2 mg/dL — ABNORMAL LOW (ref 8.9–10.3)
Chloride: 112 mmol/L — ABNORMAL HIGH (ref 98–111)
Creatinine, Ser: 1.1 mg/dL — ABNORMAL HIGH (ref 0.44–1.00)
GFR, Estimated: 54 mL/min — ABNORMAL LOW (ref >60)
Glucose, Bld: 108 mg/dL — ABNORMAL HIGH (ref 70–99)
Potassium: 3.4 mmol/L — ABNORMAL LOW (ref 3.5–5.1)
Sodium: 140 mmol/L (ref 135–145)

## 2022-08-08 MED ORDER — POTASSIUM CHLORIDE CRYS ER 20 MEQ PO TBCR
40.0000 meq | EXTENDED_RELEASE_TABLET | Freq: Once | ORAL | Status: AC
  Administered 2022-08-08: 40 meq via ORAL
  Filled 2022-08-08: qty 2

## 2022-08-08 NOTE — Progress Notes (Signed)
Inpatient Follow-up/Progress Note   Patient ID: Christy Summers is a 71 y.o. female.  Overnight Events / Subjective Findings Pt with 4-5 loose bm o/n and wbc increased to 15.6. Pt afebrile. No abdominal pain. C diff carrier but toxin returned positive. Denies any hematochezia/melena/n/v. Tolerating PO without issue. No other acute gi complaints.  Review of Systems  Constitutional:  Positive for fatigue. Negative for activity change, appetite change, chills, diaphoresis, fever and unexpected weight change.  HENT:  Negative for trouble swallowing and voice change.   Respiratory:  Negative for shortness of breath and wheezing.   Cardiovascular:  Negative for chest pain, palpitations and leg swelling.  Gastrointestinal:  Positive for diarrhea. Negative for abdominal distention, abdominal pain, anal bleeding, blood in stool, constipation, nausea, rectal pain and vomiting.  Musculoskeletal:  Negative for arthralgias and myalgias.  Skin:  Negative for color change and pallor.  Neurological:  Positive for weakness. Negative for dizziness and syncope.  Psychiatric/Behavioral:  Negative for confusion.   All other systems reviewed and are negative.    Medications  Current Facility-Administered Medications:    0.9 %  sodium chloride infusion, , Intravenous, Continuous, Wieting, Richard, MD, Last Rate: 75 mL/hr at 08/08/22 0524, New Bag at 08/08/22 0524   acetaminophen (TYLENOL) tablet 650 mg, 650 mg, Oral, Q6H PRN **OR** acetaminophen (TYLENOL) suppository 650 mg, 650 mg, Rectal, Q6H PRN, Agbata, Tochukwu, MD   aspirin EC tablet 81 mg, 81 mg, Oral, Daily, Agbata, Tochukwu, MD, 81 mg at 08/07/22 0936   heparin injection 5,000 Units, 5,000 Units, Subcutaneous, Q8H, Agbata, Tochukwu, MD, 5,000 Units at 08/08/22 0522   iron polysaccharides (NIFEREX) capsule 150 mg, 150 mg, Oral, Daily, Agbata, Tochukwu, MD, 150 mg at 08/07/22 0937   ondansetron (ZOFRAN) tablet 4 mg, 4 mg, Oral, Q6H PRN **OR**  ondansetron (ZOFRAN) injection 4 mg, 4 mg, Intravenous, Q6H PRN, Agbata, Tochukwu, MD   vancomycin (VANCOCIN) capsule 125 mg, 125 mg, Oral, QID, Wieting, Richard, MD, 125 mg at 08/07/22 2234  sodium chloride 75 mL/hr at 08/08/22 0524    acetaminophen **OR** acetaminophen, ondansetron **OR** ondansetron (ZOFRAN) IV   Objective    Vitals:   08/07/22 0933 08/07/22 1816 08/08/22 0042 08/08/22 0407  BP: 130/73 121/66 (!) 115/57 136/62  Pulse: 72 99 83 90  Resp: '18 18 18 18  '$ Temp: 97.7 F (36.5 C) 98.2 F (36.8 C) 99.2 F (37.3 C) 99.3 F (37.4 C)  TempSrc: Oral  Oral   SpO2: 100% 97% 100% 98%  Weight:      Height:         Physical Exam Vitals and nursing note reviewed.  Constitutional:      General: She is not in acute distress.    Appearance: Normal appearance. She is not ill-appearing, toxic-appearing or diaphoretic.  HENT:     Head: Normocephalic and atraumatic.     Nose: Nose normal.     Mouth/Throat:     Mouth: Mucous membranes are moist.     Pharynx: Oropharynx is clear.  Eyes:     General: No scleral icterus.    Extraocular Movements: Extraocular movements intact.  Cardiovascular:     Rate and Rhythm: Normal rate and regular rhythm.     Heart sounds: Normal heart sounds. No murmur heard.    No friction rub. No gallop.  Pulmonary:     Effort: Pulmonary effort is normal. No respiratory distress.     Breath sounds: Normal breath sounds. No wheezing, rhonchi or rales.  Abdominal:  General: Abdomen is flat. Bowel sounds are normal. There is no distension.     Palpations: Abdomen is soft.     Tenderness: There is no abdominal tenderness. There is no guarding or rebound.  Musculoskeletal:     Cervical back: Neck supple.     Right lower leg: No edema.     Left lower leg: No edema.  Skin:    General: Skin is warm and dry.     Coloration: Skin is not jaundiced or pale.  Neurological:     General: No focal deficit present.     Mental Status: She is alert and  oriented to person, place, and time. Mental status is at baseline.  Psychiatric:        Mood and Affect: Mood normal.        Behavior: Behavior normal.        Thought Content: Thought content normal.        Judgment: Judgment normal.      Laboratory Data Recent Labs  Lab 08/06/22 1432 08/07/22 0659 08/08/22 0618  WBC 10.7* 10.8* 15.6*  HGB 13.2 11.6* 11.0*  HCT 40.7 36.1 34.4*  PLT 444* 374 398   Recent Labs  Lab 08/06/22 1432 08/06/22 1434 08/07/22 0659 08/08/22 0618  NA 136  --  140 140  K 3.8  --  3.2* 3.4*  CL 98  --  109 112*  CO2 19*  --  22 24  BUN 89*  --  62* 26*  CREATININE 4.07*  --  1.58* 1.10*  CALCIUM 9.0  --  8.1* 8.2*  PROT  --  10.5*  --   --   BILITOT  --  0.7  --   --   ALKPHOS  --  84  --   --   ALT  --  10  --   --   AST  --  10*  --   --   GLUCOSE 109*  --  98 108*   No results for input(s): "INR" in the last 168 hours.    Imaging Studies: US RENAL  Result Date: 08/07/2022 CLINICAL DATA:  Acute kidney injury EXAM: RENAL / URINARY TRACT ULTRASOUND COMPLETE COMPARISON:  None Available. FINDINGS: Right Kidney: Renal measurements: 9.2 x 4.7 x 5.3 cm = volume: 121 mL. Echogenicity within normal limits. No mass or hydronephrosis visualized. Left Kidney: Renal measurements: 11.7 x 4.6 x 6.1 cm = volume: 171 mL. 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 Echogenicity within normal limits. No mass or hydronephrosis visualized. Bladder: Appears normal for degree of bladder distention. Other: None. IMPRESSION: No acute abnormality. No hydronephrosis. Electronically Signed   By: Delanna Ahmadi M.D.   On: 08/07/2022 14:38   DG Abdomen Acute W/Chest  Result Date: 08/06/2022 CLINICAL DATA:  eval for obstructive pattern EXAM: DG ABDOMEN ACUTE WITH 1 VIEW CHEST COMPARISON:  CT abdomen pelvis 08/28/2020 FINDINGS: The heart and mediastinal contours are within normal limits. No focal consolidation. No pulmonary edema. No pleural effusion. No pneumothorax.  Right upper quadrant surgical clips. There is no evidence of dilated bowel loops or free intraperitoneal air. No radiopaque calculi or other significant radiographic abnormality is seen. No acute osseous abnormality. IMPRESSION: 1. No acute cardiopulmonary disease. 2. Nonobstructive bowel gas pattern. 3.  Aortic Atherosclerosis (ICD10-I70.0). Electronically Signed   By: Iven Finn M.D.   On: 08/06/2022 17:27    Assessment:   # AKI- resolving - prerenal by FeNA 0.4% - Creatinine went up to 4 on presentation,  now down to 1.1 as of 8/20 (baseline 1) - in speaking with patient, poor po intake - on lisinopril/hctz at home - lactate 1.4   # Crohns disease - pt was scheduled to receive 2nd loading dose of inflectra yesterday, but did not receive due to feeling poorly - 8/20 loose stools o/n, increasing wbc count (15.6)    # PSC - Lfts wnl   # Normocytic anemia -  hgb 11.5 - at baseline - no signs of gib   # C diff carrier status- toxin positive with 4 loose BM o/n and wbc count of 15.6 - Positive carrier status  # Leukocytosis  Plan:  With positive toxin status, increasing BM, increasing WBC count- reasonable to start c diff treatment. Will need either dificid or pulse dose tapered vancomycin; other infectious stool negative.   -Dificid 200 mg bid for 10 days  - vanco pulse then taper- 125 mg po qid for 14 days, then '125mg'$  bid for 1 week, 125 mg po 1 week, 125 mg po q2d for 2 weeks Consider FMT or Rebyota on outpatient basis after therapy completion No steroids at this time If continued issues, may consider flex sig during hospitalization Tolerating PO Labs reviewed. US renal wnl Continue with IVF resuscitation Electrolyte correction as per primary team Monitor for worsening signs of infection  I personally performed the service.  Management of other medical comorbidities as per primary team  Thank you for allowing [place] to participate in this patient's care. Please don't  hesitate to call if any questions or concerns arise.   Annamaria Helling, DO [facility]  Portions of the record may have been created with voice recognition software. Occasional wrong-word or 'sound-a-like' substitutions may have occurred due to the inherent limitations of voice recognition software.  Read the chart carefully and recognize, using context, where substitutions may have occurred.

## 2022-08-08 NOTE — Assessment & Plan Note (Signed)
Continue advance diet.  Potassium now in the normal range.  Consider potentially getting rid of hydrochlorothiazide as outpatient.  Currently holding losartan HCT.

## 2022-08-08 NOTE — Progress Notes (Addendum)
  Progress Note   Patient: Christy Summers ZOX:[id number] DOB: 06/10/1951 DOA: 08/06/2022     2 DOS: the patient was seen and examined on 08/08/2022     Assessment and Plan: * C. difficile colitis C. difficile testing came back positive on 08/07/2022.  Continue p.o. vancomycin.  I will have pharmacy team look into the cost of an expanded taper on vancomycin versus Dificid. Case discussed with gastroenterology.  With the patient's white count going up to 15.6 and numerous bowel movements overnight watch again and reassess tomorrow.  AKI (acute kidney injury) ([place]) Creatinine 4.07 on presentation down to 1.10 with IV fluid hydration.  Recent creatinine on 07/26/2022 is 0.9.  Hypertension Holding antihypertensive medications currently.  Current blood pressure stable.  Crohn's disease [facility]) Case discussed with gastroenterology and no indications for steroids at this point.  Had a recent Remicade infusion as outpatient.  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  Elevated total protein Send off serum protein electrophoresis.  Hypokalemia Continue advance diet and replace potassium orally        Subjective: Patient having quite a few episodes of diarrhea overnight.  Diagnosed with C. difficile colitis yesterday.  No abdominal pain.  Tolerating advance diet.  Physical Exam: Vitals:   08/08/22 0407 08/08/22 0832 08/08/22 1200 08/08/22 1220  BP: 136/62 131/70 130/61 130/61  Pulse: 90 83 80 80  Resp: '18 12 12   '$ Temp: 99.3 F (37.4 C) 97.9 F (36.6 C) 98.4 F (36.9 C)   TempSrc:  Oral Oral   SpO2: 98% 100% 97% 99%  Weight:      Height:       Physical Exam HENT:     Head: Normocephalic.     Mouth/Throat:     Pharynx: No oropharyngeal exudate.  Eyes:     General: Lids are normal.     Conjunctiva/sclera: Conjunctivae normal.  Cardiovascular:     Rate and Rhythm: Normal rate and regular rhythm.     Heart sounds: Normal heart sounds, S1  normal and S2 normal.  Pulmonary:     Breath sounds: No decreased breath sounds, wheezing, rhonchi or rales.  Abdominal:     Palpations: Abdomen is soft.     Tenderness: There is no abdominal tenderness.  Musculoskeletal:     Right lower leg: No swelling.     Left lower leg: No swelling.  Skin:    General: Skin is warm.     Findings: No rash.  Neurological:     Mental Status: She is alert and oriented to person, place, and time.     Data Reviewed: White blood cell count elevated 15.6, hemoglobin 11.0, creatinine 1.1, potassium 3.4  Family Communication: Declined  Disposition: Status is: Inpatient Remains inpatient appropriate because: Patient had quite a few episodes of diarrhea overnight.  Planned Discharge Destination: Home    Time spent: 28 minutes Case discussed with gastroenterology  Author: Loletha Grayer, MD 08/08/2022 3:40 PM  For on call review [email or web address].

## 2022-08-09 ENCOUNTER — Telehealth (HOSPITAL_COMMUNITY): Payer: Self-pay | Admitting: Pharmacy Technician

## 2022-08-09 ENCOUNTER — Ambulatory Visit: Admission: RE | Admit: 2022-08-09 | Payer: PPO | Source: Ambulatory Visit

## 2022-08-09 ENCOUNTER — Other Ambulatory Visit (HOSPITAL_COMMUNITY): Payer: Self-pay

## 2022-08-09 ENCOUNTER — Other Ambulatory Visit: Payer: Self-pay

## 2022-08-09 DIAGNOSIS — K50919 Crohn's disease, unspecified, with unspecified complications: Secondary | ICD-10-CM | POA: Diagnosis not present

## 2022-08-09 DIAGNOSIS — I10 Essential (primary) hypertension: Secondary | ICD-10-CM | POA: Diagnosis not present

## 2022-08-09 DIAGNOSIS — E876 Hypokalemia: Secondary | ICD-10-CM

## 2022-08-09 DIAGNOSIS — A0472 Enterocolitis due to Clostridium difficile, not specified as recurrent: Secondary | ICD-10-CM | POA: Diagnosis not present

## 2022-08-09 DIAGNOSIS — N179 Acute kidney failure, unspecified: Secondary | ICD-10-CM | POA: Diagnosis not present

## 2022-08-09 LAB — CBC
HCT: 33 % — ABNORMAL LOW (ref 36.0–46.0)
Hemoglobin: 10.4 g/dL — ABNORMAL LOW (ref 12.0–15.0)
MCH: 30.3 pg (ref 26.0–34.0)
MCHC: 31.5 g/dL (ref 30.0–36.0)
MCV: 96.2 fL (ref 80.0–100.0)
Platelets: 323 10*3/uL (ref 150–400)
RBC: 3.43 MIL/uL — ABNORMAL LOW (ref 3.87–5.11)
RDW: 13.6 % (ref 11.5–15.5)
WBC: 10.1 10*3/uL (ref 4.0–10.5)
nRBC: 0 % (ref 0.0–0.2)

## 2022-08-09 LAB — BASIC METABOLIC PANEL
Anion gap: 3 — ABNORMAL LOW (ref 5–15)
BUN: 14 mg/dL (ref 8–23)
CO2: 24 mmol/L (ref 22–32)
Calcium: 7.9 mg/dL — ABNORMAL LOW (ref 8.9–10.3)
Chloride: 112 mmol/L — ABNORMAL HIGH (ref 98–111)
Creatinine, Ser: 0.98 mg/dL (ref 0.44–1.00)
GFR, Estimated: >60 mL/min (ref >60)
Glucose, Bld: 94 mg/dL (ref 70–99)
Potassium: 3.5 mmol/L (ref 3.5–5.1)
Sodium: 139 mmol/L (ref 135–145)

## 2022-08-09 LAB — CALPROTECTIN, FECAL: Calprotectin, Fecal: 3480 ug/g — ABNORMAL HIGH (ref 0–120)

## 2022-08-09 MED ORDER — FIDAXOMICIN 200 MG PO TABS
200.0000 mg | ORAL_TABLET | Freq: Two times a day (BID) | ORAL | 0 refills | Status: DC
Start: 2022-08-09 — End: 2022-08-19
  Filled 2022-08-09: qty 19, 10d supply, fill #0

## 2022-08-09 MED ORDER — FIDAXOMICIN 200 MG PO TABS
200.0000 mg | ORAL_TABLET | Freq: Two times a day (BID) | ORAL | Status: DC
  Administered 2022-08-09: 200 mg via ORAL
  Filled 2022-08-09: qty 1

## 2022-08-09 NOTE — Plan of Care (Signed)
  Problem: Education: Goal: Knowledge of General Education information will improve Description: Including pain rating scale, medication(s)/side effects and non-pharmacologic comfort measures 08/09/2022 0631 by Rosalita Levan, RN Outcome: Progressing 08/08/2022 2320 by Rosalita Levan, RN Outcome: Progressing   Problem: Health Behavior/Discharge Planning: Goal: Ability to manage health-related needs will improve 08/09/2022 0631 by Rosalita Levan, RN Outcome: Progressing 08/08/2022 2320 by Rosalita Levan, RN Outcome: Progressing   Problem: Clinical Measurements: Goal: Ability to maintain clinical measurements within normal limits will improve 08/09/2022 0631 by Rosalita Levan, RN Outcome: Progressing 08/08/2022 2320 by Rosalita Levan, RN Outcome: Progressing Goal: Will remain free from infection 08/09/2022 0631 by Rosalita Levan, RN Outcome: Progressing 08/08/2022 2320 by Rosalita Levan, RN Outcome: Progressing Goal: Diagnostic test results will improve 08/09/2022 0631 by Rosalita Levan, RN Outcome: Progressing 08/08/2022 2320 by Rosalita Levan, RN Outcome: Progressing Goal: Respiratory complications will improve 08/09/2022 0631 by Rosalita Levan, RN Outcome: Progressing 08/08/2022 2320 by Rosalita Levan, RN Outcome: Progressing Goal: Cardiovascular complication will be avoided 08/09/2022 0631 by Rosalita Levan, RN Outcome: Progressing 08/08/2022 2320 by Rosalita Levan, RN Outcome: Progressing   Problem: Activity: Goal: Risk for activity intolerance will decrease 08/09/2022 0631 by Rosalita Levan, RN Outcome: Progressing 08/08/2022 2320 by Rosalita Levan, RN Outcome: Progressing   Problem: Nutrition: Goal: Adequate nutrition will be maintained 08/09/2022 0631 by Rosalita Levan, RN Outcome:  Progressing 08/08/2022 2320 by Rosalita Levan, RN Outcome: Progressing   Problem: Coping: Goal: Level of anxiety will decrease 08/09/2022 0631 by Rosalita Levan, RN Outcome: Progressing 08/08/2022 2320 by Rosalita Levan, RN Outcome: Progressing   Problem: Elimination: Goal: Will not experience complications related to bowel motility 08/09/2022 0631 by Rosalita Levan, RN Outcome: Progressing 08/08/2022 2320 by Rosalita Levan, RN Outcome: Progressing Goal: Will not experience complications related to urinary retention 08/09/2022 0631 by Rosalita Levan, RN Outcome: Progressing 08/08/2022 2320 by Rosalita Levan, RN Outcome: Progressing   Problem: Pain Managment: Goal: General experience of comfort will improve 08/09/2022 0631 by Rosalita Levan, RN Outcome: Progressing 08/08/2022 2320 by Rosalita Levan, RN Outcome: Progressing   Problem: Safety: Goal: Ability to remain free from injury will improve 08/09/2022 0631 by Rosalita Levan, RN Outcome: Progressing 08/08/2022 2320 by Rosalita Levan, RN Outcome: Progressing   Problem: Skin Integrity: Goal: Risk for impaired skin integrity will decrease 08/09/2022 0631 by Rosalita Levan, RN Outcome: Progressing 08/08/2022 2320 by Rosalita Levan, RN Outcome: Progressing

## 2022-08-09 NOTE — Telephone Encounter (Signed)
Pharmacy Patient Advocate Encounter  Insurance verification completed.    The patient is insured through [employer] Part D   The patient is currently admitted and ran test claims for the following: Dificid, Vancomycin .  Copays and coinsurance results were relayed to Inpatient clinical team.

## 2022-08-09 NOTE — TOC Benefit Eligibility Note (Signed)
Patient Teacher, English as a foreign language completed.    The patient is currently admitted and upon discharge could be taking Dificid 200 mg tablets.  The current 10 day co-pay is $4.30.   The patient is currently admitted and upon discharge could be taking Vancomycin 125 mg capsules.  The current 28 day co-pay is $1.45   The patient is insured through [place], [place] Patient Advocate Specialist [place] Patient Advocate Team Direct Number: [phone]  Fax: [phone]

## 2022-08-09 NOTE — Progress Notes (Signed)
Pharmacy - Antimicrobial Stewardship  Fidaxomicin prescribed after benefit check revealed copay = $4.30  [facility] (pharmacy she usually uses) did not have fidaxomicin in stock.  Used [facility] employee/outpatient pharmacy to fill prescription.  Medication delivered to bedside.  Reviewed how to take medication and side effects.    Doreene Eland, PharmD, BCPS, BCIDP Work Cell: [phone] 08/09/2022 1:29 PM

## 2022-08-09 NOTE — Discharge Summary (Signed)
Physician Discharge Summary   Patient: Christy Summers MRN: [id number] DOB: 1951-11-05  Admit date:     08/06/2022  Discharge date: 08/09/22  Discharge Physician: Loletha Grayer   PCP: Tracie Harrier, MD   Recommendations at discharge:   Follow-up PCP 5 days Follow-up gastroenterology 1 week  Discharge Diagnoses: Principal Problem:   C. difficile colitis Active Problems:   AKI (acute kidney injury) ([place])   Hypertension   Crohn's disease ([place])   Primary sclerosing cholangitis   Elevated total protein   Hypokalemia   Hospital Course: Patient was admitted with diarrhea and found to have acute kidney injury.  Creatinine on admission 4.07 and the patient was started on IV fluids.  On 08/06/2022 patient was positive for C. difficile colitis and was started on p.o. vancomycin.  Since this was a recurrence we decided to switch over to a 10-day course of p.o. Dificid upon discharge home.  Creatinine did improve during the hospital course to 0.98.  We held her blood pressure medications during the hospital course.  She will take her blood pressure at home with a blood pressure cuff if blood pressure starts to rise can go back on Norvasc.  Would hold losartan/HCTZ until seen by PMD  Assessment and Plan: * C. difficile colitis C. difficile testing came back positive on 08/06/2022.  Patient initially started on p.o. vancomycin.  Switched over to Dificid for 10-day course.  Follow-up with gastroenterology as outpatient.  Patient still having some diarrhea but wanted to go home.  She does have Crohn's disease also.  AKI (acute kidney injury) ([place]) Creatinine 4.07 on presentation down to 0.98 with IV fluid hydration.  Hypertension Holding antihypertensive medications currently.  Current blood pressure stable.  Patient will take her blood pressure at home and keep a log.  If blood pressure starts rising quite a bit can go back on amlodipine.  Continue to hold losartan HCT.  Crohn's disease  [facility]) Case discussed with gastroenterology and no indications for steroids at this point.  Had a recent Remicade infusion as outpatient.  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  Elevated total protein serum protein electrophoresis pending  Hypokalemia Continue advance diet.  Potassium now in the normal range.  Consider potentially getting rid of hydrochlorothiazide as outpatient.  Currently holding losartan HCT.         Consultants: Gastroenterology Procedures performed: None Disposition: Home Diet recommendation:  Heart healthy DISCHARGE MEDICATION: Allergies as of 08/09/2022       Reactions   Mesalamine Diarrhea, Nausea Only        Medication List     STOP taking these medications    amLODipine 10 MG tablet Commonly known as: NORVASC   iron polysaccharides 150 MG capsule Commonly known as: NIFEREX   loperamide 2 MG tablet Commonly known as: IMODIUM A-D   losartan-hydrochlorothiazide 100-25 MG tablet Commonly known as: Animator COVID-19 Vac Bivalent injection Generic drug: COVID-19 mRNA bivalent vaccine (Pfizer)   potassium chloride 10 MEQ tablet Commonly known as: KLOR-CON       TAKE these medications    aspirin EC 81 MG tablet Take 81 mg by mouth daily.   Cholecalciferol 25 MCG (1000 UT) tablet Take 1,000 Units by mouth daily.   cyanocobalamin 1000 MCG tablet Take 1,000 mcg by mouth daily.   Dificid 200 MG Tabs tablet Generic drug: fidaxomicin Take 1 tablet (200 mg total) by mouth 2 (two) times daily for 10 days.   Tums Ultra 1000 400 MG  chewable tablet Generic drug: calcium elemental as carbonate Chew 1,000 mg by mouth daily.   Vitamin C 500 MG Caps Take 500 mg by mouth daily.        Follow-up Information     Tracie Harrier, MD Follow up in 5 day(s).   Specialty: Internal Medicine Contact information: [place]  [id number] [phone]         Lesly Rubenstein, MD Follow up in 1 week(s).   Specialty: Gastroenterology Contact information: [place] [address] [phone]                Discharge Exam: Danley Danker Weights   08/06/22 1429  Weight: 96.2 kg   Physical Exam HENT:     Head: Normocephalic.     Mouth/Throat:     Pharynx: No oropharyngeal exudate.  Eyes:     General: Lids are normal.     Conjunctiva/sclera: Conjunctivae normal.  Cardiovascular:     Rate and Rhythm: Normal rate and regular rhythm.     Heart sounds: Normal heart sounds, S1 normal and S2 normal.  Pulmonary:     Breath sounds: No decreased breath sounds, wheezing, rhonchi or rales.  Abdominal:     Palpations: Abdomen is soft.     Tenderness: There is no abdominal tenderness.  Musculoskeletal:     Right lower leg: No swelling.     Left lower leg: No swelling.  Skin:    General: Skin is warm.     Findings: No rash.  Neurological:     Mental Status: She is alert and oriented to person, place, and time.      Condition at discharge: stable  The results of significant diagnostics from this hospitalization (including imaging, microbiology, ancillary and laboratory) are listed below for reference.   Imaging Studies: US RENAL  Result Date: 08/07/2022 CLINICAL DATA:  Acute kidney injury EXAM: RENAL / URINARY TRACT ULTRASOUND COMPLETE COMPARISON:  None Available. FINDINGS: Right Kidney: Renal measurements: 9.2 x 4.7 x 5.3 cm = volume: 121 mL. Echogenicity within normal limits. No mass or hydronephrosis visualized. Left Kidney: Renal measurements: 11.7 x 4.6 x 6.1 cm = volume: 171 mL. 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 Echogenicity within normal limits. No mass or hydronephrosis visualized. Bladder: Appears normal for degree of bladder distention. Other: None. IMPRESSION: No acute abnormality. No hydronephrosis. Electronically Signed   By: Delanna Ahmadi M.D.   On: 08/07/2022  14:38   DG Abdomen Acute W/Chest  Result Date: 08/06/2022 CLINICAL DATA:  eval for obstructive pattern EXAM: DG ABDOMEN ACUTE WITH 1 VIEW CHEST COMPARISON:  CT abdomen pelvis 08/28/2020 FINDINGS: The heart and mediastinal contours are within normal limits. No focal consolidation. No pulmonary edema. No pleural effusion. No pneumothorax. Right upper quadrant surgical clips. There is no evidence of dilated bowel loops or free intraperitoneal air. No radiopaque calculi or other significant radiographic abnormality is seen. No acute osseous abnormality. IMPRESSION: 1. No acute cardiopulmonary disease. 2. Nonobstructive bowel gas pattern. 3.  Aortic Atherosclerosis (ICD10-I70.0). Electronically Signed   By: Iven Finn M.D.   On: 08/06/2022 17:27    Microbiology: Results for orders placed or performed during the hospital encounter of 08/06/22  Gastrointestinal Panel by PCR , Stool     Status: None   Collection Time: 08/06/22 11:20 AM   Specimen: Rectum; Stool  Result Value Ref Range Status   Campylobacter species NOT DETECTED NOT DETECTED Final   Plesimonas shigelloides NOT DETECTED  NOT DETECTED Final   Salmonella species NOT DETECTED NOT DETECTED Final   Yersinia enterocolitica NOT DETECTED NOT DETECTED Final   Vibrio species NOT DETECTED NOT DETECTED Final   Vibrio cholerae NOT DETECTED NOT DETECTED Final   Enteroaggregative E coli (EAEC) NOT DETECTED NOT DETECTED Final   Enteropathogenic E coli (EPEC) NOT DETECTED NOT DETECTED Final   Enterotoxigenic E coli (ETEC) NOT DETECTED NOT DETECTED Final   Shiga like toxin producing E coli (STEC) NOT DETECTED NOT DETECTED Final   Shigella/Enteroinvasive E coli (EIEC) NOT DETECTED NOT DETECTED Final   Cryptosporidium NOT DETECTED NOT DETECTED Final   Cyclospora cayetanensis NOT DETECTED NOT DETECTED Final   Entamoeba histolytica NOT DETECTED NOT DETECTED Final   Giardia lamblia NOT DETECTED NOT DETECTED Final   Adenovirus F40/41 NOT DETECTED NOT  DETECTED Final   Astrovirus NOT DETECTED NOT DETECTED Final   Norovirus GI/GII NOT DETECTED NOT DETECTED Final   Rotavirus A NOT DETECTED NOT DETECTED Final   Sapovirus (I, II, IV, and V) NOT DETECTED NOT DETECTED Final    Comment: Performed at [place] [facility], [address] [place], [place] [address]  C Difficile Quick Screen w PCR reflex     Status: Abnormal   Collection Time: 08/06/22 11:20 AM   Specimen: Rectum; Stool  Result Value Ref Range Status   C Diff antigen POSITIVE (A) NEGATIVE Final   C Diff toxin POSITIVE (A) NEGATIVE Final   C Diff interpretation Toxin producing C. difficile detected.  Final    Comment: CRITICAL RESULT CALLED TO, READ BACK BY AND VERIFIED WITH: LESIA MCKNIGHT 08/07/22 1314 AMK Performed at [facility], [address] [place], [place] [address]     Labs: CBC: Recent Labs  Lab 08/06/22 1432 08/07/22 0659 08/08/22 0618 08/09/22 0648  WBC 10.7* 10.8* 15.6* 10.1  HGB 13.2 11.6* 11.0* 10.4*  HCT 40.7 36.1 34.4* 33.0*  MCV 94.9 94.5 96.4 96.2  PLT 444* 374 398 277   Basic Metabolic Panel: Recent Labs  Lab 08/06/22 1432 08/07/22 0659 08/08/22 0618 08/09/22 0648  NA 136 140 140 139  K 3.8 3.2* 3.4* 3.5  CL 98 109 112* 112*  CO2 19* '22 24 24  '$ GLUCOSE 109* 98 108* 94  BUN 89* 62* 26* 14  CREATININE 4.07* 1.58* 1.10* 0.98  CALCIUM 9.0 8.1* 8.2* 7.9*   Liver Function Tests: Recent Labs  Lab 08/06/22 1434  AST 10*  ALT 10  ALKPHOS 84  BILITOT 0.7  PROT 10.5*  ALBUMIN 3.9   CBG: No results for input(s): "GLUCAP" in the last 168 hours.  Discharge time spent: greater than 30 minutes.  Signed: Loletha Grayer, MD [facility] 08/09/2022

## 2022-08-09 NOTE — TOC Initial Note (Signed)
Transition of Care [facility]) - Initial/Assessment Note    Patient Details  Name: Christy Summers MRN: [id number] Date of Birth: 01-18-1951  Transition of [facility]) CM/SW Contact:    Pete Pelt, RN Phone Number: 08/09/2022, 2:00 PM  Clinical Narrative:  Patient lives at home alone, states she can call son for help if needed.  She transports herself to appointments is able to get to pharmacy if needed.  Paitent asked for MD to review medication changes, MD visited patient in room to discuss prior to discharge.                       Patient Goals and CMS Choice        Expected Discharge Plan and Services           Expected Discharge Date: 08/09/22                                    Prior Living Arrangements/Services                       Activities of Daily Living Home Assistive Devices/Equipment: None ADL Screening (condition at time of admission) Patient's cognitive ability adequate to safely complete daily activities?: Yes Is the patient deaf or have difficulty hearing?: No Does the patient have difficulty seeing, even when wearing glasses/contacts?: No Does the patient have difficulty concentrating, remembering, or making decisions?: No Patient able to express need for assistance with ADLs?: No Does the patient have difficulty dressing or bathing?: No Independently performs ADLs?: Yes (appropriate for developmental age) Does the patient have difficulty walking or climbing stairs?: No Weakness of Legs: Both Weakness of Arms/Hands: Both  Permission Sought/Granted                  Emotional Assessment              Admission diagnosis:  AKI (acute kidney injury) ([place]) [N17.9] Patient Active Problem List   Diagnosis Date Noted   Hypokalemia 08/08/2022   C. difficile colitis 08/07/2022   Primary sclerosing cholangitis 08/07/2022   Elevated total protein 08/07/2022   AKI (acute kidney injury) ([place]) 08/06/2022   Hypertension     Crohn's disease ([place])    Ovarian mass, right 04/23/2020   Normocytic anemia 12/29/2018   PCP:  Tracie Harrier, MD Pharmacy:   [facility] [id number], [place] [address] Phone: [phone] Fax: [phone]  [place] [address] Phone: [phone] Fax: [phone]     Social Determinants of Health (SDOH) Interventions    Readmission Risk Interventions     No data to display

## 2022-08-09 NOTE — Care Management Important Message (Signed)
Important Message  Patient Details  Name: Christy Summers MRN: [id number] Date of Birth: August 29, 1951   Medicare Important Message Given:  N/A - LOS <3 / Initial given by admissions     Christy Summers 08/09/2022, 11:31 AM

## 2022-08-13 DIAGNOSIS — K50119 Crohn's disease of large intestine with unspecified complications: Secondary | ICD-10-CM | POA: Diagnosis not present

## 2022-08-13 LAB — PROTEIN ELECTROPHORESIS, SERUM
A/G Ratio: 0.6 — ABNORMAL LOW (ref 0.7–1.7)
Albumin ELP: 3.2 g/dL (ref 2.9–4.4)
Alpha-1-Globulin: 0.3 g/dL (ref 0.0–0.4)
Alpha-2-Globulin: 0.9 g/dL (ref 0.4–1.0)
Beta Globulin: 1.2 g/dL (ref 0.7–1.3)
Gamma Globulin: 3.3 g/dL — ABNORMAL HIGH (ref 0.4–1.8)
Globulin, Total: 5.6 g/dL — ABNORMAL HIGH (ref 2.2–3.9)
Total Protein ELP: 8.8 g/dL — ABNORMAL HIGH (ref 6.0–8.5)

## 2022-08-19 ENCOUNTER — Other Ambulatory Visit: Payer: Self-pay

## 2022-08-19 DIAGNOSIS — K50819 Crohn's disease of both small and large intestine with unspecified complications: Secondary | ICD-10-CM | POA: Diagnosis not present

## 2022-08-19 DIAGNOSIS — E8809 Other disorders of plasma-protein metabolism, not elsewhere classified: Secondary | ICD-10-CM | POA: Diagnosis not present

## 2022-08-19 DIAGNOSIS — K8301 Primary sclerosing cholangitis: Secondary | ICD-10-CM | POA: Diagnosis not present

## 2022-08-19 DIAGNOSIS — N179 Acute kidney failure, unspecified: Secondary | ICD-10-CM | POA: Diagnosis not present

## 2022-08-19 DIAGNOSIS — Z09 Encounter for follow-up examination after completed treatment for conditions other than malignant neoplasm: Secondary | ICD-10-CM | POA: Diagnosis not present

## 2022-08-19 DIAGNOSIS — E860 Dehydration: Secondary | ICD-10-CM | POA: Diagnosis not present

## 2022-08-19 DIAGNOSIS — E7849 Other hyperlipidemia: Secondary | ICD-10-CM | POA: Diagnosis not present

## 2022-08-19 DIAGNOSIS — D649 Anemia, unspecified: Secondary | ICD-10-CM | POA: Diagnosis not present

## 2022-08-19 DIAGNOSIS — A0472 Enterocolitis due to Clostridium difficile, not specified as recurrent: Secondary | ICD-10-CM | POA: Diagnosis not present

## 2022-08-19 MED ORDER — DIFICID 200 MG PO TABS
ORAL_TABLET | ORAL | 0 refills | Status: DC
  Filled 2022-08-19: qty 8, 4d supply, fill #0

## 2022-08-26 ENCOUNTER — Other Ambulatory Visit: Payer: Self-pay

## 2022-08-26 MED ORDER — DIFICID 200 MG PO TABS
ORAL_TABLET | ORAL | 0 refills | Status: DC
  Filled 2022-08-26: qty 14, 7d supply, fill #0

## 2022-08-27 ENCOUNTER — Other Ambulatory Visit: Payer: Self-pay | Admitting: Gastroenterology

## 2022-08-27 ENCOUNTER — Ambulatory Visit
Admission: RE | Admit: 2022-08-27 | Discharge: 2022-08-27 | Disposition: A | Discharge status: Home / Self Care | Payer: PPO | Source: Ambulatory Visit | Attending: Gastroenterology | Admitting: Gastroenterology

## 2022-08-27 DIAGNOSIS — K8301 Primary sclerosing cholangitis: Secondary | ICD-10-CM | POA: Diagnosis not present

## 2022-08-27 DIAGNOSIS — K3189 Other diseases of stomach and duodenum: Secondary | ICD-10-CM | POA: Diagnosis not present

## 2022-08-27 DIAGNOSIS — K831 Obstruction of bile duct: Secondary | ICD-10-CM | POA: Diagnosis not present

## 2022-08-27 DIAGNOSIS — K50012 Crohn's disease of small intestine with intestinal obstruction: Secondary | ICD-10-CM | POA: Diagnosis not present

## 2022-08-27 MED ORDER — GADOBUTROL 1 MMOL/ML IV SOLN
10.0000 mL | Freq: Once | INTRAVENOUS | Status: AC | PRN
  Administered 2022-08-27: 10 mL via INTRAVENOUS

## 2022-09-03 DIAGNOSIS — K50119 Crohn's disease of large intestine with unspecified complications: Secondary | ICD-10-CM | POA: Diagnosis not present

## 2022-09-20 ENCOUNTER — Other Ambulatory Visit: Payer: Self-pay

## 2022-09-20 ENCOUNTER — Emergency Department
Admission: EM | Admit: 2022-09-20 | Discharge: 2022-09-20 | Disposition: A | Discharge status: Home / Self Care | Payer: PPO | Attending: Emergency Medicine | Admitting: Emergency Medicine

## 2022-09-20 ENCOUNTER — Emergency Department: Payer: PPO

## 2022-09-20 DIAGNOSIS — Z8619 Personal history of other infectious and parasitic diseases: Secondary | ICD-10-CM | POA: Diagnosis not present

## 2022-09-20 DIAGNOSIS — A0472 Enterocolitis due to Clostridium difficile, not specified as recurrent: Secondary | ICD-10-CM

## 2022-09-20 DIAGNOSIS — I9589 Other hypotension: Secondary | ICD-10-CM | POA: Diagnosis not present

## 2022-09-20 DIAGNOSIS — R110 Nausea: Secondary | ICD-10-CM | POA: Insufficient documentation

## 2022-09-20 DIAGNOSIS — R531 Weakness: Secondary | ICD-10-CM

## 2022-09-20 DIAGNOSIS — D649 Anemia, unspecified: Secondary | ICD-10-CM | POA: Diagnosis not present

## 2022-09-20 DIAGNOSIS — R197 Diarrhea, unspecified: Secondary | ICD-10-CM

## 2022-09-20 DIAGNOSIS — K5090 Crohn's disease, unspecified, without complications: Secondary | ICD-10-CM | POA: Diagnosis not present

## 2022-09-20 DIAGNOSIS — Z6836 Body mass index (BMI) 36.0-36.9, adult: Secondary | ICD-10-CM | POA: Diagnosis not present

## 2022-09-20 DIAGNOSIS — R5383 Other fatigue: Secondary | ICD-10-CM | POA: Diagnosis not present

## 2022-09-20 DIAGNOSIS — K6389 Other specified diseases of intestine: Secondary | ICD-10-CM | POA: Diagnosis not present

## 2022-09-20 DIAGNOSIS — K529 Noninfective gastroenteritis and colitis, unspecified: Secondary | ICD-10-CM | POA: Diagnosis not present

## 2022-09-20 DIAGNOSIS — D72829 Elevated white blood cell count, unspecified: Secondary | ICD-10-CM | POA: Insufficient documentation

## 2022-09-20 DIAGNOSIS — E861 Hypovolemia: Secondary | ICD-10-CM | POA: Diagnosis not present

## 2022-09-20 LAB — COMPREHENSIVE METABOLIC PANEL
ALT: 33 U/L (ref 0–44)
AST: 16 U/L (ref 15–41)
Albumin: 3.5 g/dL (ref 3.5–5.0)
Alkaline Phosphatase: 194 U/L — ABNORMAL HIGH (ref 38–126)
Anion gap: 10 (ref 5–15)
BUN: 31 mg/dL — ABNORMAL HIGH (ref 8–23)
CO2: 25 mmol/L (ref 22–32)
Calcium: 8.8 mg/dL — ABNORMAL LOW (ref 8.9–10.3)
Chloride: 101 mmol/L (ref 98–111)
Creatinine, Ser: 1.62 mg/dL — ABNORMAL HIGH (ref 0.44–1.00)
GFR, Estimated: 34 mL/min — ABNORMAL LOW (ref >60)
Glucose, Bld: 122 mg/dL — ABNORMAL HIGH (ref 70–99)
Potassium: 3.1 mmol/L — ABNORMAL LOW (ref 3.5–5.1)
Sodium: 136 mmol/L (ref 135–145)
Total Bilirubin: 0.6 mg/dL (ref 0.3–1.2)
Total Protein: 9.8 g/dL — ABNORMAL HIGH (ref 6.5–8.1)

## 2022-09-20 LAB — URINALYSIS, ROUTINE W REFLEX MICROSCOPIC
Bilirubin Urine: NEGATIVE
Glucose, UA: NEGATIVE mg/dL
Hgb urine dipstick: NEGATIVE
Ketones, ur: NEGATIVE mg/dL
Nitrite: NEGATIVE
Protein, ur: 30 mg/dL — AB
Specific Gravity, Urine: 1.016 (ref 1.005–1.030)
pH: 5 (ref 5.0–8.0)

## 2022-09-20 LAB — CBC
HCT: 39.2 % (ref 36.0–46.0)
Hemoglobin: 12.4 g/dL (ref 12.0–15.0)
MCH: 30.7 pg (ref 26.0–34.0)
MCHC: 31.6 g/dL (ref 30.0–36.0)
MCV: 97 fL (ref 80.0–100.0)
Platelets: 306 10*3/uL (ref 150–400)
RBC: 4.04 MIL/uL (ref 3.87–5.11)
RDW: 14.4 % (ref 11.5–15.5)
WBC: 19.9 10*3/uL — ABNORMAL HIGH (ref 4.0–10.5)
nRBC: 0 % (ref 0.0–0.2)

## 2022-09-20 LAB — CLOSTRIDIUM DIFFICILE BY PCR, REFLEXED: Toxigenic C. Difficile by PCR: POSITIVE — AB

## 2022-09-20 LAB — C DIFFICILE QUICK SCREEN W PCR REFLEX
C Diff antigen: POSITIVE — AB
C Diff toxin: NEGATIVE

## 2022-09-20 MED ORDER — FIDAXOMICIN 200 MG PO TABS: 200.0000 mg | ORAL_TABLET | Freq: Two times a day (BID) | ORAL | 0 refills | Status: AC

## 2022-09-20 MED ORDER — FIDAXOMICIN 200 MG PO TABS
200.0000 mg | ORAL_TABLET | Freq: Once | ORAL | Status: AC
  Administered 2022-09-20: 200 mg via ORAL
  Filled 2022-09-20: qty 1

## 2022-09-20 MED ORDER — SODIUM CHLORIDE 0.9 % IV BOLUS
1000.0000 mL | Freq: Once | INTRAVENOUS | Status: AC
  Administered 2022-09-20: 1000 mL via INTRAVENOUS

## 2022-09-20 NOTE — ED Provider Notes (Signed)
Emergency department handoff note  Care of this patient was signed out to me at the end of the previous provider shift.  All pertinent patient information was conveyed and all questions were answered.  Patient pending C. difficile testing as well as CT of the abdomen and pelvis.  The CT of her abdomen and pelvis have come back positive for likely enteritis and a PCR C. difficile is come back positive.  Patient is likely colonized with C. difficile however with the CT evidence of enteritis, patient will need to be treated.  Patient states that Dificid has worked for her in the past and therefore will provide outpatient prescription as patient is stable and wishes to go home at this time.  Patient is seen by Dr. Haig Prophet and GI and was encouraged to follow-up as soon as possible.  All questions were answered to the best my ability prior to discharge  Dispo: Discharge home with GI follow-up   Naaman Plummer, MD 09/20/22 1719

## 2022-09-20 NOTE — ED Notes (Signed)
This RN and Ria Comment, RN attempted to start an IV, both with no success.

## 2022-09-20 NOTE — ED Triage Notes (Signed)
Pt arrives with c/o generalized weakness and fatigue over the past week. Per pt, she has crohns and has been having diarrhea. Pt endorses lightheadedness nausea. Pt denies CP or SOB.

## 2022-09-20 NOTE — ED Provider Notes (Signed)
[facility] Provider Note    Event Date/Time   First MD Initiated Contact with Patient 09/20/22 1104     (approximate)  History   Chief Complaint: Fatigue  HPI  Christy Summers is a 71 y.o. female with a past medical history Crohn's disease, C. difficile infection, presents to the emergency department for 2 weeks of diarrhea.  According to the patient for the past week she has been experiencing daily episodes of diarrhea.  Has been taking Imodium 4 mg each day without relief.  Patient was recently seen by her GI doctor, per record review on 09/06/2022 patient had a [place] fecal treatment.  Patient states shortly after that she felt somewhat better however over the past week and a half or so the diarrhea has returned and has worsened.  Patient denies any black or bloody stool.  States intermittent nausea with 1 episode of nausea this morning.  No abdominal pain.  Patient has a sensation of generalized fatigue and weakness over the past week or so.  Physical Exam   Triage Vital Signs: ED Triage Vitals  Enc Vitals Group     BP 09/20/22 1100 (!) 104/57     Pulse Rate 09/20/22 1100 98     Resp 09/20/22 1100 20     Temp 09/20/22 1100 98.5 F (36.9 C)     Temp Source 09/20/22 1100 Oral     SpO2 09/20/22 1100 93 %     Weight 09/20/22 1102 210 lb (95.3 kg)     Height 09/20/22 1102 '5\' 6"'$  (1.676 m)     Head Circumference --      Peak Flow --      Pain Score --      Pain Loc --      Pain Edu? --      Excl. in [place]? --     Most recent vital signs: Vitals:   09/20/22 1100  BP: (!) 104/57  Pulse: 98  Resp: 20  Temp: 98.5 F (36.9 C)  SpO2: 93%    General: Awake, no distress.  CV:  Good peripheral perfusion.  Regular rate and rhythm  Resp:  Normal effort.  Equal breath sounds bilaterally.  Abd:  No distention.  Soft, nontender.  No rebound or guarding.   ED Results / Procedures / Treatments   EKG  EKG viewed and interpreted by myself shows a normal  sinus rhythm at 96 bpm with a narrow QRS, left axis deviation, largely normal intervals with nonspecific ST changes.  No ST elevation.  RADIOLOGY  CT scan pending   MEDICATIONS ORDERED IN ED: Medications  sodium chloride 0.9 % bolus 1,000 mL (has no administration in time range)     IMPRESSION / MDM / ASSESSMENT AND PLAN / ED COURSE  I reviewed the triage vital signs and the nursing notes.  Patient's presentation is most consistent with acute presentation with potential threat to life or bodily function.  Patient presents emergency department for 2 weeks of diarrhea now with generalized weakness fatigue.  Patient denies any abdominal pain.  Patient has a history of recurrent C. difficile as well as Crohn's colitis.  Benign abdomen on exam.  We will IV hydrate.  We will check labs including CBC, chemistry, urinalysis.  We will attempt to obtain stool sample for C. difficile testing.  Patient states she has been experiencing approximately 4 or 5 loose stools over the past 24 hours.  Patient's work-up shows an elevated white blood cell count  of 19,000, this could be concerning for possible recurrent C. difficile.  Patient has still been unable to produce a stool sample.  Chemistry shows mild renal insufficiency.  We will obtain CT imaging of the abdomen/pelvis to further evaluate for possible colitis again possibly indicating C. difficile.  Urinalysis negative.  Patient receiving IV fluids.  Patient care signed out to oncoming provider, CT and stool study pending.  FINAL CLINICAL IMPRESSION(S) / ED DIAGNOSES   Diarrhea Weakness    Note:  This document was prepared using Dragon voice recognition software and may include unintentional dictation errors.   Harvest Dark, MD 09/20/22 [phone]

## 2022-09-29 DIAGNOSIS — E8809 Other disorders of plasma-protein metabolism, not elsewhere classified: Secondary | ICD-10-CM | POA: Diagnosis not present

## 2022-09-29 DIAGNOSIS — A0471 Enterocolitis due to Clostridium difficile, recurrent: Secondary | ICD-10-CM | POA: Diagnosis not present

## 2022-09-29 DIAGNOSIS — D649 Anemia, unspecified: Secondary | ICD-10-CM | POA: Diagnosis not present

## 2022-09-29 DIAGNOSIS — K50819 Crohn's disease of both small and large intestine with unspecified complications: Secondary | ICD-10-CM | POA: Diagnosis not present

## 2022-09-29 DIAGNOSIS — K529 Noninfective gastroenteritis and colitis, unspecified: Secondary | ICD-10-CM | POA: Diagnosis not present

## 2022-09-29 DIAGNOSIS — K8301 Primary sclerosing cholangitis: Secondary | ICD-10-CM | POA: Diagnosis not present

## 2022-09-29 DIAGNOSIS — I10 Essential (primary) hypertension: Secondary | ICD-10-CM | POA: Diagnosis not present

## 2022-09-29 DIAGNOSIS — Z6833 Body mass index (BMI) 33.0-33.9, adult: Secondary | ICD-10-CM | POA: Diagnosis not present

## 2022-09-29 DIAGNOSIS — Z23 Encounter for immunization: Secondary | ICD-10-CM | POA: Diagnosis not present

## 2022-10-01 DIAGNOSIS — K5080 Crohn's disease of both small and large intestine without complications: Secondary | ICD-10-CM | POA: Diagnosis not present

## 2022-10-18 DIAGNOSIS — K50119 Crohn's disease of large intestine with unspecified complications: Secondary | ICD-10-CM | POA: Diagnosis not present

## 2022-11-17 DIAGNOSIS — K5080 Crohn's disease of both small and large intestine without complications: Secondary | ICD-10-CM | POA: Diagnosis not present

## 2022-11-18 DIAGNOSIS — K5080 Crohn's disease of both small and large intestine without complications: Secondary | ICD-10-CM | POA: Diagnosis not present

## 2022-11-19 DIAGNOSIS — K5080 Crohn's disease of both small and large intestine without complications: Secondary | ICD-10-CM | POA: Diagnosis not present

## 2022-11-26 ENCOUNTER — Other Ambulatory Visit: Payer: Self-pay

## 2022-11-26 MED ORDER — RINVOQ 45 MG PO TB24
ORAL_TABLET | ORAL | 0 refills | Status: DC
  Filled 2022-11-29 – 2022-12-01 (×2): qty 28, 28d supply, fill #0
  Filled 2022-12-21: qty 28, 28d supply, fill #1

## 2022-11-29 ENCOUNTER — Other Ambulatory Visit (HOSPITAL_COMMUNITY): Payer: Self-pay

## 2022-11-29 ENCOUNTER — Other Ambulatory Visit: Payer: Self-pay

## 2022-11-30 ENCOUNTER — Other Ambulatory Visit (HOSPITAL_COMMUNITY): Payer: Self-pay

## 2022-12-01 ENCOUNTER — Other Ambulatory Visit (HOSPITAL_COMMUNITY): Payer: Self-pay

## 2022-12-01 ENCOUNTER — Other Ambulatory Visit: Payer: Self-pay

## 2022-12-02 ENCOUNTER — Other Ambulatory Visit (HOSPITAL_COMMUNITY): Payer: Self-pay

## 2022-12-03 ENCOUNTER — Other Ambulatory Visit: Payer: Self-pay

## 2022-12-16 DIAGNOSIS — E7849 Other hyperlipidemia: Secondary | ICD-10-CM | POA: Diagnosis not present

## 2022-12-16 DIAGNOSIS — D649 Anemia, unspecified: Secondary | ICD-10-CM | POA: Diagnosis not present

## 2022-12-16 DIAGNOSIS — E860 Dehydration: Secondary | ICD-10-CM | POA: Diagnosis not present

## 2022-12-16 DIAGNOSIS — A0472 Enterocolitis due to Clostridium difficile, not specified as recurrent: Secondary | ICD-10-CM | POA: Diagnosis not present

## 2022-12-16 DIAGNOSIS — K8301 Primary sclerosing cholangitis: Secondary | ICD-10-CM | POA: Diagnosis not present

## 2022-12-16 DIAGNOSIS — N179 Acute kidney failure, unspecified: Secondary | ICD-10-CM | POA: Diagnosis not present

## 2022-12-16 DIAGNOSIS — Z09 Encounter for follow-up examination after completed treatment for conditions other than malignant neoplasm: Secondary | ICD-10-CM | POA: Diagnosis not present

## 2022-12-16 DIAGNOSIS — E8809 Other disorders of plasma-protein metabolism, not elsewhere classified: Secondary | ICD-10-CM | POA: Diagnosis not present

## 2022-12-21 ENCOUNTER — Other Ambulatory Visit: Payer: Self-pay

## 2022-12-21 ENCOUNTER — Other Ambulatory Visit (HOSPITAL_COMMUNITY): Payer: Self-pay

## 2022-12-22 ENCOUNTER — Other Ambulatory Visit (HOSPITAL_COMMUNITY): Payer: Self-pay

## 2022-12-23 ENCOUNTER — Other Ambulatory Visit: Payer: Self-pay

## 2022-12-23 ENCOUNTER — Other Ambulatory Visit (HOSPITAL_COMMUNITY): Payer: Self-pay

## 2022-12-23 DIAGNOSIS — K5090 Crohn's disease, unspecified, without complications: Secondary | ICD-10-CM | POA: Diagnosis not present

## 2022-12-23 DIAGNOSIS — E7800 Pure hypercholesterolemia, unspecified: Secondary | ICD-10-CM | POA: Diagnosis not present

## 2022-12-23 DIAGNOSIS — Z0000 Encounter for general adult medical examination without abnormal findings: Secondary | ICD-10-CM | POA: Diagnosis not present

## 2022-12-23 DIAGNOSIS — D649 Anemia, unspecified: Secondary | ICD-10-CM | POA: Diagnosis not present

## 2022-12-23 DIAGNOSIS — I358 Other nonrheumatic aortic valve disorders: Secondary | ICD-10-CM | POA: Diagnosis not present

## 2022-12-23 DIAGNOSIS — I10 Essential (primary) hypertension: Secondary | ICD-10-CM | POA: Diagnosis not present

## 2022-12-23 DIAGNOSIS — K8301 Primary sclerosing cholangitis: Secondary | ICD-10-CM | POA: Diagnosis not present

## 2022-12-23 DIAGNOSIS — Z6836 Body mass index (BMI) 36.0-36.9, adult: Secondary | ICD-10-CM | POA: Diagnosis not present

## 2022-12-29 DIAGNOSIS — K5080 Crohn's disease of both small and large intestine without complications: Secondary | ICD-10-CM | POA: Diagnosis not present

## 2022-12-29 DIAGNOSIS — D649 Anemia, unspecified: Secondary | ICD-10-CM | POA: Diagnosis not present

## 2023-01-14 ENCOUNTER — Other Ambulatory Visit (HOSPITAL_COMMUNITY): Payer: Self-pay

## 2023-01-17 ENCOUNTER — Other Ambulatory Visit (HOSPITAL_COMMUNITY): Payer: Self-pay

## 2023-01-17 MED ORDER — RINVOQ 15 MG PO TB24
ORAL_TABLET | ORAL | 3 refills | Status: DC
  Filled 2023-01-17: qty 30, 30d supply, fill #0
  Filled 2023-02-15 – 2023-02-16 (×2): qty 30, 30d supply, fill #1
  Filled 2023-03-16: qty 30, 30d supply, fill #2
  Filled 2023-04-05: qty 30, 30d supply, fill #3

## 2023-01-19 ENCOUNTER — Other Ambulatory Visit (HOSPITAL_COMMUNITY): Payer: Self-pay

## 2023-01-20 ENCOUNTER — Other Ambulatory Visit (HOSPITAL_COMMUNITY): Payer: Self-pay

## 2023-01-24 ENCOUNTER — Other Ambulatory Visit: Payer: Self-pay

## 2023-02-10 ENCOUNTER — Other Ambulatory Visit: Payer: Self-pay

## 2023-02-15 ENCOUNTER — Other Ambulatory Visit (HOSPITAL_COMMUNITY): Payer: Self-pay

## 2023-02-16 ENCOUNTER — Other Ambulatory Visit (HOSPITAL_COMMUNITY): Payer: Self-pay

## 2023-03-17 ENCOUNTER — Other Ambulatory Visit (HOSPITAL_COMMUNITY): Payer: Self-pay

## 2023-03-22 ENCOUNTER — Encounter: Payer: Self-pay | Admitting: *Deleted

## 2023-03-28 ENCOUNTER — Encounter: Payer: Self-pay | Admitting: *Deleted

## 2023-03-29 ENCOUNTER — Encounter
Admission: RE | Disposition: A | Discharge status: Home / Self Care | Payer: Self-pay | Source: Home / Self Care | Attending: Gastroenterology

## 2023-03-29 ENCOUNTER — Other Ambulatory Visit: Payer: Self-pay

## 2023-03-29 ENCOUNTER — Ambulatory Visit: Payer: PPO | Admitting: Anesthesiology

## 2023-03-29 ENCOUNTER — Ambulatory Visit
Admission: RE | Admit: 2023-03-29 | Discharge: 2023-03-29 | Disposition: A | Discharge status: Home / Self Care | Payer: PPO | Attending: Gastroenterology | Admitting: Gastroenterology

## 2023-03-29 ENCOUNTER — Encounter: Payer: Self-pay | Admitting: *Deleted

## 2023-03-29 DIAGNOSIS — K633 Ulcer of intestine: Secondary | ICD-10-CM | POA: Insufficient documentation

## 2023-03-29 DIAGNOSIS — K5289 Other specified noninfective gastroenteritis and colitis: Secondary | ICD-10-CM | POA: Diagnosis not present

## 2023-03-29 DIAGNOSIS — K640 First degree hemorrhoids: Secondary | ICD-10-CM | POA: Diagnosis not present

## 2023-03-29 DIAGNOSIS — E669 Obesity, unspecified: Secondary | ICD-10-CM | POA: Diagnosis not present

## 2023-03-29 DIAGNOSIS — Z9049 Acquired absence of other specified parts of digestive tract: Secondary | ICD-10-CM | POA: Diagnosis not present

## 2023-03-29 DIAGNOSIS — K588 Other irritable bowel syndrome: Secondary | ICD-10-CM | POA: Diagnosis not present

## 2023-03-29 DIAGNOSIS — K523 Indeterminate colitis: Secondary | ICD-10-CM | POA: Insufficient documentation

## 2023-03-29 DIAGNOSIS — K5090 Crohn's disease, unspecified, without complications: Secondary | ICD-10-CM | POA: Diagnosis not present

## 2023-03-29 DIAGNOSIS — Z1211 Encounter for screening for malignant neoplasm of colon: Secondary | ICD-10-CM | POA: Diagnosis not present

## 2023-03-29 DIAGNOSIS — K529 Noninfective gastroenteritis and colitis, unspecified: Secondary | ICD-10-CM | POA: Diagnosis not present

## 2023-03-29 DIAGNOSIS — K649 Unspecified hemorrhoids: Secondary | ICD-10-CM | POA: Diagnosis not present

## 2023-03-29 DIAGNOSIS — I10 Essential (primary) hypertension: Secondary | ICD-10-CM | POA: Diagnosis not present

## 2023-03-29 DIAGNOSIS — Z6837 Body mass index (BMI) 37.0-37.9, adult: Secondary | ICD-10-CM | POA: Diagnosis not present

## 2023-03-29 DIAGNOSIS — R7303 Prediabetes: Secondary | ICD-10-CM | POA: Insufficient documentation

## 2023-03-29 DIAGNOSIS — K635 Polyp of colon: Secondary | ICD-10-CM | POA: Diagnosis not present

## 2023-03-29 DIAGNOSIS — K5140 Inflammatory polyps of colon without complications: Secondary | ICD-10-CM | POA: Diagnosis not present

## 2023-03-29 HISTORY — PX: COLONOSCOPY WITH PROPOFOL: SHX5780

## 2023-03-29 HISTORY — DX: Crohn's disease, unspecified, without complications: K50.90

## 2023-03-29 SURGERY — COLONOSCOPY WITH PROPOFOL
Anesthesia: General

## 2023-03-29 MED ORDER — SODIUM CHLORIDE 0.9 % IV SOLN: INTRAVENOUS | Status: DC

## 2023-03-29 MED ORDER — EPHEDRINE SULFATE (PRESSORS) 50 MG/ML IJ SOLN
INTRAMUSCULAR | Status: DC | PRN
  Administered 2023-03-29: 10 mg via INTRAVENOUS

## 2023-03-29 MED ORDER — PROPOFOL 10 MG/ML IV BOLUS
INTRAVENOUS | Status: DC | PRN
  Administered 2023-03-29 (×2): 10 mg via INTRAVENOUS
  Administered 2023-03-29: 60 mg via INTRAVENOUS

## 2023-03-29 MED ORDER — PROPOFOL 500 MG/50ML IV EMUL
INTRAVENOUS | Status: DC | PRN
  Administered 2023-03-29: 165 ug/kg/min via INTRAVENOUS

## 2023-03-29 MED ORDER — LIDOCAINE HCL (CARDIAC) PF 100 MG/5ML IV SOSY
PREFILLED_SYRINGE | INTRAVENOUS | Status: DC | PRN
  Administered 2023-03-29: 100 mg via INTRAVENOUS

## 2023-03-29 MED ORDER — GLYCOPYRROLATE 0.2 MG/ML IJ SOLN
INTRAMUSCULAR | Status: DC | PRN
  Administered 2023-03-29: .2 mg via INTRAVENOUS

## 2023-03-29 NOTE — Transfer of Care (Signed)
Immediate Anesthesia Transfer of Care Note  Patient: Christy Summers  Procedure(s) Performed: COLONOSCOPY WITH PROPOFOL  Patient Location: Endoscopy Unit  Anesthesia Type:General  Level of Consciousness: drowsy and patient cooperative  Airway & Oxygen Therapy: Patient Spontanous Breathing and Patient connected to face mask oxygen  Post-op Assessment: Report given to RN and Post -op Vital signs reviewed and stable  Post vital signs: Reviewed and stable  Last Vitals:  Vitals Value Taken Time  BP    Temp    Pulse 100 03/29/23 0826  Resp 30 03/29/23 0826  SpO2 100 % 03/29/23 0826  Vitals shown include unvalidated device data.  Last Pain:  Vitals:   03/29/23 0717  TempSrc: Temporal  PainSc: 0-No pain         Complications: No notable events documented.

## 2023-03-29 NOTE — Anesthesia Procedure Notes (Signed)
Procedure Name: General with mask airway Date/Time: 03/29/2023 7:52 AM  Performed by: Mohammed Kindle, CRNAPre-anesthesia Checklist: Patient identified, Emergency Drugs available, Suction available and Patient being monitored Patient Re-evaluated:Patient Re-evaluated prior to induction Oxygen Delivery Method: Simple face mask Induction Type: IV induction Placement Confirmation: positive ETCO2, CO2 detector and breath sounds checked- equal and bilateral Dental Injury: Teeth and Oropharynx as per pre-operative assessment

## 2023-03-29 NOTE — Anesthesia Preprocedure Evaluation (Addendum)
Anesthesia Evaluation  Patient identified by MRN, date of birth, ID band Patient awake    Reviewed: Allergy & Precautions, NPO status , Patient's Chart, lab work & pertinent test results  History of Anesthesia Complications Negative for: history of anesthetic complications  Airway Mallampati: IV   Neck ROM: Full    Dental  (+) Partial Lower, Upper Dentures   Pulmonary neg pulmonary ROS   Pulmonary exam normal breath sounds clear to auscultation       Cardiovascular hypertension, Normal cardiovascular exam Rhythm:Regular Rate:Normal  ECHO 1/24: NORMAL LEFT VENTRICULAR SYSTOLIC FUNCTION  NORMAL RIGHT VENTRICULAR SYSTOLIC FUNCTION  MILD VALVULAR REGURGITATION (See above)  MILD VALVULAR STENOSIS (See above)  GLS 19.5  Mild calcific AS     Neuro/Psych negative neurological ROS     GI/Hepatic Crohn's disease Primary sclerosing cholangitis   Endo/Other  Obesity, prediabetes  Renal/GU negative Renal ROS     Musculoskeletal   Abdominal  (+) + obese  Peds  Hematology  (+) Blood dyscrasia, anemia   Anesthesia Other Findings   Reproductive/Obstetrics                             Anesthesia Physical Anesthesia Plan  ASA: 2  Anesthesia Plan: General   Post-op Pain Management:    Induction: Intravenous  PONV Risk Score and Plan: 3 and Propofol infusion, TIVA and Treatment may vary due to age or medical condition  Airway Management Planned: Natural Airway  Additional Equipment:   Intra-op Plan:   Post-operative Plan:   Informed Consent: I have reviewed the patients History and Physical, chart, labs and discussed the procedure including the risks, benefits and alternatives for the proposed anesthesia with the patient or authorized representative who has indicated his/her understanding and acceptance.       Plan Discussed with: CRNA  Anesthesia Plan Comments:          Anesthesia Quick Evaluation

## 2023-03-29 NOTE — H&P (Signed)
Outpatient short stay form Pre-procedure 03/29/2023  Regis Bill, MD  Primary Physician: Barbette Reichmann, MD  Reason for visit:  Crohns disease activity assessment  History of present illness:    72 y/o lady with PSC and crohns here for disease activity assessment after starting upadacitinib. No blood thinners. History of cholecystectomy and hysterectomy. No family history of GI malignancies.    Current Facility-Administered Medications:    0.9 %  sodium chloride infusion, , Intravenous, Continuous, Mellie Buccellato, Rossie Muskrat, MD, Last Rate: 20 mL/hr at 03/29/23 0734, New Bag at 03/29/23 0734  Medications Prior to Admission  Medication Sig Dispense Refill Last Dose   amLODipine (NORVASC) 5 MG tablet Take 5 mg by mouth daily.   03/29/2023   Ascorbic Acid (VITAMIN C) 500 MG CAPS Take 500 mg by mouth daily.    Past Week   aspirin 81 MG EC tablet Take 81 mg by mouth daily.    Past Week   calcium elemental as carbonate (TUMS ULTRA 1000) 400 MG chewable tablet Chew 1,000 mg by mouth daily.   Past Week   Cholecalciferol 25 MCG (1000 UT) tablet Take 1,000 Units by mouth daily.    Past Week   cyanocobalamin 1000 MCG tablet Take 1,000 mcg by mouth daily.   Past Week   Upadacitinib ER (RINVOQ) 15 MG TB24 Take 15 mg by mouth once daily as needed 30 tablet 3 03/29/2023   Upadacitinib ER (RINVOQ) 45 MG TB24 Take 45 mg by mouth once daily For induction dosing. 56 tablet 0      Allergies  Allergen Reactions   Mesalamine Diarrhea and Nausea Only     Past Medical History:  Diagnosis Date   Anemia    normocytic   Bilateral ovarian cysts    only right ovarian cyst   Chronic diarrhea    Crohn's disease    Elevated lipids    History of Clostridioides difficile colitis 04/2020   pt. has been positive and negative since 2017. most recent result was negative. diarrhea persists   Hypertension     Review of systems:  Otherwise negative.    Physical Exam  Gen: Alert, oriented. Appears stated  age.  HEENT: PERRLA. Lungs: No respiratory distress CV: RRR Abd: soft, benign, no masses Ext: No edema   Planned procedures: Proceed with colonoscopy. The patient understands the nature of the planned procedure, indications, risks, alternatives and potential complications including but not limited to bleeding, infection, perforation, damage to internal organs and possible oversedation/side effects from anesthesia. The patient agrees and gives consent to proceed.  Please refer to procedure notes for findings, recommendations and patient disposition/instructions.     Regis Bill, MD [facility]

## 2023-03-29 NOTE — Anesthesia Postprocedure Evaluation (Signed)
Anesthesia Post Note  Patient: Christy Summers  Procedure(s) Performed: COLONOSCOPY WITH PROPOFOL  Patient location during evaluation: Endoscopy Anesthesia Type: General Level of consciousness: awake and alert Pain management: pain level controlled Vital Signs Assessment: post-procedure vital signs reviewed and stable Respiratory status: spontaneous breathing, nonlabored ventilation and respiratory function stable Cardiovascular status: blood pressure returned to baseline and stable Postop Assessment: no apparent nausea or vomiting Anesthetic complications: no   No notable events documented.   Last Vitals:  Vitals:   03/29/23 0825 03/29/23 0847  BP: 115/69 (!) 148/92  Pulse: 100   Resp: (!) 24   Temp: (!) 36.3 C   SpO2: 100%     Last Pain:  Vitals:   03/29/23 0847  TempSrc:   PainSc: 0-No pain                 Foye Deer

## 2023-03-29 NOTE — Op Note (Signed)
[facility] Patient Name: Christy Summers Procedure Date: 03/29/2023 7:48 AM MRN: [id number] Account #: [id number] Date of Birth: [birth year] Admit Type: Outpatient Age: 72 Room: [facility] ENDO ROOM 4 Gender: Female Note Status: Finalized Instrument Name: Nelda Marseille [id number] Procedure:             Colonoscopy Indications:           High risk colon cancer surveillance: Inflammatory                         bowel disease with primary sclerosing cholangitis Providers:             Eather Colas MD, MD Referring MD:          Barbette Reichmann, MD (Referring MD) Medicines:             Monitored Anesthesia Care Complications:         No immediate complications. Estimated blood loss:                         Minimal. Procedure:             Pre-Anesthesia Assessment:                        - Prior to the procedure, a History and Physical was                         performed, and patient medications and allergies were                         reviewed. The patient is competent. The risks and                         benefits of the procedure and the sedation options and                         risks were discussed with the patient. All questions                         were answered and informed consent was obtained.                         Patient identification and proposed procedure were                         verified by the physician, the nurse, the                         anesthesiologist, the anesthetist and the technician                         in the endoscopy suite. Mental Status Examination:                         alert and oriented. Airway Examination: normal                         oropharyngeal airway and neck mobility. Respiratory  Examination: clear to auscultation. CV Examination:                         normal. Prophylactic Antibiotics: The patient does not                         require prophylactic antibiotics. Prior                          Anticoagulants: The patient has taken no anticoagulant                         or antiplatelet agents. ASA Grade Assessment: II - A                         patient with mild systemic disease. After reviewing                         the risks and benefits, the patient was deemed in                         satisfactory condition to undergo the procedure. The                         anesthesia plan was to use monitored anesthesia care                         (MAC). Immediately prior to administration of                         medications, the patient was re-assessed for adequacy                         to receive sedatives. The heart rate, respiratory                         rate, oxygen saturations, blood pressure, adequacy of                         pulmonary ventilation, and response to care were                         monitored throughout the procedure. The physical                         status of the patient was re-assessed after the                         procedure.                        After obtaining informed consent, the colonoscope was                         passed under direct vision. Throughout the procedure,                         the patient's blood pressure, pulse, and oxygen  saturations were monitored continuously. The                         Colonoscope was introduced through the anus and                         advanced to the the terminal ileum. The colonoscopy                         was performed without difficulty. The patient                         tolerated the procedure well. The quality of the bowel                         preparation was good. The terminal ileum, ileocecal                         valve, appendiceal orifice, and rectum were                         photographed. Findings:      The perianal and digital rectal examinations were normal.      The terminal ileum appeared normal. Biopsies were taken  with a cold       forceps for histology. Estimated blood loss was minimal.      Inflammation characterized by erosions was found as small patches       surrounded by normal mucosa in the rectum, in the sigmoid colon and in       the ascending colon. The inflammation was mild in severity, and when       compared to previous examinations, the findings are in remission.       Biopsies were taken with a cold forceps for histology. Estimated blood       loss was minimal.      A 1 mm polyp was found in the descending colon. The polyp was sessile.       The polyp was removed with a jumbo cold forceps. Resection and retrieval       were complete. Estimated blood loss was minimal.      A 15 mm polyp was found in the proximal sigmoid colon. The polyp was       semi-pedunculated. The polyp was removed with a hot snare. Resection and       retrieval were complete. To prevent bleeding post-intervention, one       hemostatic clip was successfully placed. There was no bleeding during,       or at the end, of the procedure. Area was tattooed with an injection of       0.5 mL of [place] ink.      A 2 mm polyp was found in the sigmoid colon. The polyp was sessile. The       polyp was removed with a jumbo cold forceps. Resection and retrieval       were complete. Estimated blood loss was minimal.      A 2 mm polyp was found in the sigmoid colon. The polyp was sessile. The       polyp was removed with a cold snare. Resection and retrieval were       complete. Estimated blood loss was minimal.  Internal hemorrhoids were found during retroflexion. The hemorrhoids       were Grade I (internal hemorrhoids that do not prolapse).      The exam was otherwise without abnormality on direct and retroflexion       views. Impression:            - The examined portion of the ileum was normal.                         Biopsied.                        - Inflammation was found in the rectum, in the sigmoid                          colon and in the ascending colon. This was mild in                         severity, in remission compared to previous                         examinations. Biopsied.                        - One 1 mm polyp in the descending colon, removed with                         a jumbo cold forceps. Resected and retrieved.                        - One 15 mm polyp in the proximal sigmoid colon,                         removed with a hot snare. Resected and retrieved. Clip                         was placed. Tattooed.                        - One 2 mm polyp in the sigmoid colon, removed with a                         jumbo cold forceps. Resected and retrieved.                        - One 2 mm polyp in the sigmoid colon, removed with a                         cold snare. Resected and retrieved.                        - Internal hemorrhoids.                        - The examination was otherwise normal on direct and                         retroflexion views. Recommendation:        - Discharge patient to home.                        -  Resume previous diet.                        - Continue present medications.                        - Await pathology results.                        - Repeat colonoscopy in 1 year for surveillance.                        - Return to referring physician as previously                         scheduled. Procedure Code(s):     --- Professional ---                        [phone], Colonoscopy, flexible; with removal of                         tumor(s), polyp(s), or other lesion(s) by snare                         technique                        45380, 59, Colonoscopy, flexible; with biopsy, single                         or multiple                        45381, Colonoscopy, flexible; with directed submucosal                         injection(s), any substance Diagnosis Code(s):     --- Professional ---                        K52.3, Indeterminate colitis                         K83.01, Primary sclerosing cholangitis                        K64.0, First degree hemorrhoids                        K52.9, Noninfective gastroenteritis and colitis,                         unspecified                        D12.4, Benign neoplasm of descending colon                        D12.5, Benign neoplasm of sigmoid colon CPT copyright 2022 American Medical Association. All rights reserved. The codes documented in this report are preliminary and upon coder review may  be revised to meet current compliance requirements. Eather Colas MD, MD 03/29/2023 8:32:11 AM Number of Addenda: 0 Note Initiated On: 03/29/2023 7:48 AM Scope Withdrawal Time:  0 hours 19 minutes 29 seconds  Total Procedure Duration: 0 hours 25 minutes 57 seconds  Estimated Blood Loss:  Estimated blood loss was minimal.      [facility]

## 2023-03-29 NOTE — Interval H&P Note (Signed)
History and Physical Interval Note:  03/29/2023 7:48 AM  Christy Summers  has presented today for surgery, with the diagnosis of crohns.  The various methods of treatment have been discussed with the patient and family. After consideration of risks, benefits and other options for treatment, the patient has consented to  Procedure(s): COLONOSCOPY WITH PROPOFOL (N/A) as a surgical intervention.  The patient's history has been reviewed, patient examined, no change in status, stable for surgery.  I have reviewed the patient's chart and labs.  Questions were answered to the patient's satisfaction.     Regis Bill  Ok to proceed with colonoscopy

## 2023-03-30 ENCOUNTER — Encounter: Payer: Self-pay | Admitting: Gastroenterology

## 2023-03-30 LAB — SURGICAL PATHOLOGY

## 2023-04-05 ENCOUNTER — Other Ambulatory Visit (HOSPITAL_COMMUNITY): Payer: Self-pay

## 2023-04-12 ENCOUNTER — Other Ambulatory Visit (HOSPITAL_COMMUNITY): Payer: Self-pay

## 2023-04-19 DIAGNOSIS — E7800 Pure hypercholesterolemia, unspecified: Secondary | ICD-10-CM | POA: Diagnosis not present

## 2023-04-19 DIAGNOSIS — K5090 Crohn's disease, unspecified, without complications: Secondary | ICD-10-CM | POA: Diagnosis not present

## 2023-04-19 DIAGNOSIS — Z6836 Body mass index (BMI) 36.0-36.9, adult: Secondary | ICD-10-CM | POA: Diagnosis not present

## 2023-04-19 DIAGNOSIS — I10 Essential (primary) hypertension: Secondary | ICD-10-CM | POA: Diagnosis not present

## 2023-04-19 DIAGNOSIS — K5080 Crohn's disease of both small and large intestine without complications: Secondary | ICD-10-CM | POA: Diagnosis not present

## 2023-04-19 DIAGNOSIS — R7309 Other abnormal glucose: Secondary | ICD-10-CM | POA: Diagnosis not present

## 2023-04-19 DIAGNOSIS — Z0000 Encounter for general adult medical examination without abnormal findings: Secondary | ICD-10-CM | POA: Diagnosis not present

## 2023-04-19 DIAGNOSIS — K8301 Primary sclerosing cholangitis: Secondary | ICD-10-CM | POA: Diagnosis not present

## 2023-04-19 DIAGNOSIS — D649 Anemia, unspecified: Secondary | ICD-10-CM | POA: Diagnosis not present

## 2023-04-20 DIAGNOSIS — D649 Anemia, unspecified: Secondary | ICD-10-CM | POA: Diagnosis not present

## 2023-04-20 DIAGNOSIS — K5090 Crohn's disease, unspecified, without complications: Secondary | ICD-10-CM | POA: Diagnosis not present

## 2023-04-20 DIAGNOSIS — Z0000 Encounter for general adult medical examination without abnormal findings: Secondary | ICD-10-CM | POA: Diagnosis not present

## 2023-04-20 DIAGNOSIS — E7800 Pure hypercholesterolemia, unspecified: Secondary | ICD-10-CM | POA: Diagnosis not present

## 2023-04-20 DIAGNOSIS — I10 Essential (primary) hypertension: Secondary | ICD-10-CM | POA: Diagnosis not present

## 2023-04-20 DIAGNOSIS — K8301 Primary sclerosing cholangitis: Secondary | ICD-10-CM | POA: Diagnosis not present

## 2023-04-20 DIAGNOSIS — Z6836 Body mass index (BMI) 36.0-36.9, adult: Secondary | ICD-10-CM | POA: Diagnosis not present

## 2023-05-04 DIAGNOSIS — I10 Essential (primary) hypertension: Secondary | ICD-10-CM | POA: Diagnosis not present

## 2023-05-04 DIAGNOSIS — K5000 Crohn's disease of small intestine without complications: Secondary | ICD-10-CM | POA: Diagnosis not present

## 2023-05-04 DIAGNOSIS — R739 Hyperglycemia, unspecified: Secondary | ICD-10-CM | POA: Diagnosis not present

## 2023-05-04 DIAGNOSIS — K8301 Primary sclerosing cholangitis: Secondary | ICD-10-CM | POA: Diagnosis not present

## 2023-05-04 DIAGNOSIS — R3129 Other microscopic hematuria: Secondary | ICD-10-CM | POA: Diagnosis not present

## 2023-05-04 DIAGNOSIS — Z8619 Personal history of other infectious and parasitic diseases: Secondary | ICD-10-CM | POA: Diagnosis not present

## 2023-05-04 DIAGNOSIS — D649 Anemia, unspecified: Secondary | ICD-10-CM | POA: Diagnosis not present

## 2023-05-04 DIAGNOSIS — Z6837 Body mass index (BMI) 37.0-37.9, adult: Secondary | ICD-10-CM | POA: Diagnosis not present

## 2023-05-09 ENCOUNTER — Other Ambulatory Visit: Payer: Self-pay

## 2023-05-09 ENCOUNTER — Other Ambulatory Visit (HOSPITAL_COMMUNITY): Payer: Self-pay

## 2023-05-09 MED ORDER — RINVOQ 15 MG PO TB24
ORAL_TABLET | ORAL | 3 refills | Status: DC
  Filled 2023-05-09: qty 30, 30d supply, fill #0
  Filled 2023-06-09: qty 30, 30d supply, fill #1
  Filled 2023-07-12: qty 30, 30d supply, fill #2
  Filled 2023-08-16: qty 30, 30d supply, fill #3

## 2023-05-12 ENCOUNTER — Other Ambulatory Visit: Payer: Self-pay | Admitting: Internal Medicine

## 2023-05-12 DIAGNOSIS — Z1231 Encounter for screening mammogram for malignant neoplasm of breast: Secondary | ICD-10-CM

## 2023-05-17 ENCOUNTER — Other Ambulatory Visit (HOSPITAL_COMMUNITY): Payer: Self-pay

## 2023-05-18 ENCOUNTER — Other Ambulatory Visit: Payer: Self-pay

## 2023-06-03 ENCOUNTER — Other Ambulatory Visit (HOSPITAL_COMMUNITY): Payer: Self-pay

## 2023-06-07 ENCOUNTER — Other Ambulatory Visit (HOSPITAL_COMMUNITY): Payer: Self-pay

## 2023-06-08 ENCOUNTER — Encounter: Payer: Self-pay | Admitting: Urology

## 2023-06-08 ENCOUNTER — Ambulatory Visit: Payer: Medicaid Other | Admitting: Urology

## 2023-06-08 VITALS — BP 159/82 | HR 112 | Ht 65.5 in | Wt 230.0 lb

## 2023-06-08 DIAGNOSIS — R319 Hematuria, unspecified: Secondary | ICD-10-CM

## 2023-06-08 LAB — URINALYSIS, COMPLETE
Bilirubin, UA: NEGATIVE
Glucose, UA: NEGATIVE
Leukocytes,UA: NEGATIVE
Nitrite, UA: NEGATIVE
Specific Gravity, UA: 1.02 (ref 1.005–1.030)
Urobilinogen, Ur: 0.2 mg/dL (ref 0.2–1.0)
pH, UA: 6 (ref 5.0–7.5)

## 2023-06-08 LAB — MICROSCOPIC EXAMINATION: Epithelial Cells (non renal): >10 /hpf — AB (ref 0–10)

## 2023-06-08 NOTE — Progress Notes (Signed)
I, Duke Salvia, acting as a Neurosurgeon for Christy Altes, MD., have documented all relevant documentation on the behalf of Christy Altes, MD, as directed by  Christy Altes, MD while in the presence of Christy Altes, MD.   06/08/2023 5:02 PM   Christy Summers 16-Oct-1951 [id number]  Referring provider: Barbette Reichmann, MD [address] [facility] [place],  [place] [address]  Chief Complaint  Patient presents with   Hematuria    HPI: Christy Summers is a 72 y.o. female referred for hematuria.  Urinalysis /1/24 showed 1+blood on dipstick and 32 RBCs on microscopy. Prior UA 12/16/22 with 13 RBCs on microscopy. Denies gross hematuria. No prior history of urologic problems. No flank, abdominal, or pelvic pain. No previous tobacco use. No bothersome voiding symptoms.   PMH: Past Medical History:  Diagnosis Date   Anemia    normocytic   Bilateral ovarian cysts    only right ovarian cyst   Chronic diarrhea    Crohn's disease (HCC)    Elevated lipids    History of Clostridioides difficile colitis 04/2020   pt. has been positive and negative since 2017. most recent result was negative. diarrhea persists   Hypertension     Surgical History: Past Surgical History:  Procedure Laterality Date   CHOLECYSTECTOMY     COLONOSCOPY WITH PROPOFOL N/A 09/07/2017   Procedure: COLONOSCOPY WITH PROPOFOL;  Surgeon: Toledo, Boykin Nearing, MD;  Location: [facility] ENDOSCOPY;  Service: Endoscopy;  Laterality: N/A;   COLONOSCOPY WITH PROPOFOL N/A 01/22/2019   Procedure: COLONOSCOPY WITH PROPOFOL;  Surgeon: Scot Jun, MD;  Location: [facility] ENDOSCOPY;  Service: Endoscopy;  Laterality: N/A;   COLONOSCOPY WITH PROPOFOL N/A 09/21/2021   Procedure: COLONOSCOPY WITH PROPOFOL;  Surgeon: Regis Bill, MD;  Location: [facility] ENDOSCOPY;  Service: Endoscopy;  Laterality: N/A;   COLONOSCOPY WITH PROPOFOL N/A 03/29/2023   Procedure: COLONOSCOPY WITH PROPOFOL;  Surgeon: Regis Bill, MD;  Location: [facility] ENDOSCOPY;  Service: Endoscopy;  Laterality: N/A;   ESOPHAGOGASTRODUODENOSCOPY (EGD) WITH PROPOFOL N/A 09/07/2017   Procedure: ESOPHAGOGASTRODUODENOSCOPY (EGD) WITH PROPOFOL;  Surgeon: Toledo, Boykin Nearing, MD;  Location: [facility] ENDOSCOPY;  Service: Endoscopy;  Laterality: N/A;   TOTAL LAPAROSCOPIC HYSTERECTOMY WITH BILATERAL SALPINGO OOPHORECTOMY N/A 04/30/2020   Procedure: HYSTERECTOMY TOTAL LAPAROSCOPIC/BILATERAL SALPINGO OOPHORECTOMY POSSIBLE PELVIC/AORTIC LYMPH NODE DISSECTION, OMENTECTOMY, LAPAROTOMY;  Surgeon: Leida Lauth, MD;  Location: [facility] ORS;  Service: Gynecology;  Laterality: N/A;    Home Medications:  Allergies as of 06/08/2023       Reactions   Mesalamine Diarrhea, Nausea Only        Medication List        Accurate as of June 08, 2023  5:02 PM. If you have any questions, ask your nurse or doctor.          amLODipine 5 MG tablet Commonly known as: NORVASC Take 5 mg by mouth daily.   aspirin EC 81 MG tablet Take 81 mg by mouth daily.   Cholecalciferol 25 MCG (1000 UT) tablet Take 1,000 Units by mouth daily.   cyanocobalamin 1000 MCG tablet Take 1,000 mcg by mouth daily.   Rinvoq 45 MG Tb24 Generic drug: Upadacitinib ER Take 45 mg by mouth once daily For induction dosing.   Rinvoq 15 MG Tb24 Generic drug: Upadacitinib ER Take 15 mg by mouth once daily as needed   Tums Ultra 1000 400 MG chewable tablet Generic drug: calcium elemental as carbonate Chew 1,000 mg by mouth daily.  Vitamin C 500 MG Caps Take 500 mg by mouth daily.        Allergies:  Allergies  Allergen Reactions   Mesalamine Diarrhea and Nausea Only    Family History: Family History  Problem Relation Age of Onset   Hypertension Mother    Stroke Mother    Stroke Sister    Hypertension Sister    Hypertension Brother    Breast cancer Neg Hx     Social History:  reports that she has never smoked. She has never used smokeless tobacco. She reports  that she does not currently use alcohol. She reports that she does not currently use drugs.   Physical Exam: BP (!) 159/82   Pulse (!) 112   Ht 5' 5.5" (1.664 m)   Wt 230 lb (104.3 kg)   BMI 37.69 kg/m   Constitutional:  Alert and oriented, No acute distress. HEENT: [place] AT, moist mucus membranes.  Trachea midline, no masses. Cardiovascular: No clubbing, cyanosis, or edema. Respiratory: Normal respiratory effort, no increased work of breathing. GI: Abdomen is soft, nontender, nondistended, no abdominal masses Skin: No rashes, bruises or suspicious lesions. Neurologic: Grossly intact, no focal deficits, moving all 4 extremities. Psychiatric: Normal mood and affect.   Assessment & Plan:    1.  Microhematuria AUA risk stratification: High We discussed the recommended evaluation of high risk hematuria which consist of CT urogram and cystoscopy.  The procedures were discussed in detail and he/she has elected to proceed with further evaluation All questions were answered CTU order placed and cystoscopy was scheduled    [facility] [address], [address] [place], [place] [address] [phone]

## 2023-06-09 ENCOUNTER — Other Ambulatory Visit: Payer: Self-pay

## 2023-06-09 ENCOUNTER — Other Ambulatory Visit (HOSPITAL_COMMUNITY): Payer: Self-pay

## 2023-06-13 ENCOUNTER — Emergency Department: Payer: PPO

## 2023-06-13 ENCOUNTER — Other Ambulatory Visit: Payer: Self-pay

## 2023-06-13 ENCOUNTER — Encounter: Payer: Self-pay | Admitting: Emergency Medicine

## 2023-06-13 ENCOUNTER — Emergency Department
Admission: EM | Admit: 2023-06-13 | Discharge: 2023-06-13 | Disposition: A | Discharge status: Home / Self Care | Payer: PPO | Attending: Emergency Medicine | Admitting: Emergency Medicine

## 2023-06-13 DIAGNOSIS — R700 Elevated erythrocyte sedimentation rate: Secondary | ICD-10-CM | POA: Diagnosis not present

## 2023-06-13 DIAGNOSIS — M25531 Pain in right wrist: Secondary | ICD-10-CM | POA: Diagnosis not present

## 2023-06-13 DIAGNOSIS — E876 Hypokalemia: Secondary | ICD-10-CM | POA: Diagnosis not present

## 2023-06-13 DIAGNOSIS — M79641 Pain in right hand: Secondary | ICD-10-CM | POA: Diagnosis not present

## 2023-06-13 LAB — CBC WITH DIFFERENTIAL/PLATELET
Abs Immature Granulocytes: 0.09 10*3/uL — ABNORMAL HIGH (ref 0.00–0.07)
Basophils Absolute: 0 10*3/uL (ref 0.0–0.1)
Basophils Relative: 0 %
Eosinophils Absolute: 0.1 10*3/uL (ref 0.0–0.5)
Eosinophils Relative: 1 %
HCT: 38.3 % (ref 36.0–46.0)
Hemoglobin: 12.8 g/dL (ref 12.0–15.0)
Immature Granulocytes: 1 %
Lymphocytes Relative: 10 %
Lymphs Abs: 1.4 10*3/uL (ref 0.7–4.0)
MCH: 31.2 pg (ref 26.0–34.0)
MCHC: 33.4 g/dL (ref 30.0–36.0)
MCV: 93.4 fL (ref 80.0–100.0)
Monocytes Absolute: 0.8 10*3/uL (ref 0.1–1.0)
Monocytes Relative: 6 %
Neutro Abs: 11.2 10*3/uL — ABNORMAL HIGH (ref 1.7–7.7)
Neutrophils Relative %: 82 %
Platelets: 444 10*3/uL — ABNORMAL HIGH (ref 150–400)
RBC: 4.1 MIL/uL (ref 3.87–5.11)
RDW: 14.1 % (ref 11.5–15.5)
WBC: 13.6 10*3/uL — ABNORMAL HIGH (ref 4.0–10.5)
nRBC: 0 % (ref 0.0–0.2)

## 2023-06-13 LAB — BASIC METABOLIC PANEL
Anion gap: 11 (ref 5–15)
BUN: 10 mg/dL (ref 8–23)
CO2: 26 mmol/L (ref 22–32)
Calcium: 9.5 mg/dL (ref 8.9–10.3)
Chloride: 100 mmol/L (ref 98–111)
Creatinine, Ser: 0.86 mg/dL (ref 0.44–1.00)
GFR, Estimated: >60 mL/min (ref >60)
Glucose, Bld: 151 mg/dL — ABNORMAL HIGH (ref 70–99)
Potassium: 2.9 mmol/L — ABNORMAL LOW (ref 3.5–5.1)
Sodium: 137 mmol/L (ref 135–145)

## 2023-06-13 LAB — SEDIMENTATION RATE: Sed Rate: 78 mm/hr — ABNORMAL HIGH (ref 0–30)

## 2023-06-13 LAB — URIC ACID: Uric Acid, Serum: 4.1 mg/dL (ref 2.5–7.1)

## 2023-06-13 MED ORDER — POTASSIUM CHLORIDE CRYS ER 20 MEQ PO TBCR: 40.0000 meq | EXTENDED_RELEASE_TABLET | Freq: Once | ORAL | Status: DC

## 2023-06-13 MED ORDER — METHYLPREDNISOLONE 4 MG PO TBPK: ORAL_TABLET | ORAL | 0 refills | Status: DC

## 2023-06-13 NOTE — ED Provider Notes (Signed)
[facility] Provider Note    Event Date/Time   First MD Initiated Contact with Patient 06/13/23 [phone]     (approximate)   History   Hand Injury   HPI  Christy Summers is a 72 y.o. female with history of Crohn's, PSC, hypertension and anemia presents emergency department with complaints of right wrist swelling and pain.  Patient states she cannot move the wrist.  Symptoms started overnight but did have some pain and swelling yesterday.  No fever or chills.  No known injury.  No history of gout      Physical Exam   Triage Vital Signs: ED Triage Vitals  Enc Vitals Group     BP 06/13/23 0754 (!) 144/76     Pulse Rate 06/13/23 0754 81     Resp 06/13/23 0754 18     Temp 06/13/23 0754 98.2 F (36.8 C)     Temp src --      SpO2 06/13/23 0754 97 %     Weight 06/13/23 0808 229 lb 15 oz (104.3 kg)     Height 06/13/23 0808 5' 5.5" (1.664 m)     Head Circumference --      Peak Flow --      Pain Score 06/13/23 0754 9     Pain Loc --      Pain Edu? --      Excl. in GC? --     Most recent vital signs: Vitals:   06/13/23 0754  BP: (!) 144/76  Pulse: 81  Resp: 18  Temp: 98.2 F (36.8 C)  SpO2: 97%     General: Awake, no distress.   CV:  Good peripheral perfusion. regular rate and  rhythm Resp:  Normal effort. Lungs cta Abd:  No distention.   Other:  Right wrist is swollen tender warm to touch, patient has decreased range of motion mostly secondary to discomfort, neurovascular is intact   ED Results / Procedures / Treatments   Labs (all labs ordered are listed, but only abnormal results are displayed) Labs Reviewed  CBC WITH DIFFERENTIAL/PLATELET - Abnormal; Notable for the following components:      Result Value   WBC 13.6 (*)    Platelets 444 (*)    Neutro Abs 11.2 (*)    Abs Immature Granulocytes 0.09 (*)    All other components within normal limits  BASIC METABOLIC PANEL - Abnormal; Notable for the following components:   Potassium  2.9 (*)    Glucose, Bld 151 (*)    All other components within normal limits  SEDIMENTATION RATE - Abnormal; Notable for the following components:   Sed Rate 78 (*)    All other components within normal limits  URIC ACID     EKG     RADIOLOGY X-ray of the right wrist and hand were ordered from triage    PROCEDURES:   Procedures   MEDICATIONS ORDERED IN ED: Medications  potassium chloride SA (KLOR-CON M) CR tablet 40 mEq (40 mEq Oral Not Given 06/13/23 0936)     IMPRESSION / MDM / ASSESSMENT AND PLAN / ED COURSE  I reviewed the triage vital signs and the nursing notes.                              Differential diagnosis includes, but is not limited to, cellulitis, gout, bursitis, osteomyelitis, septic arthritis, RA  Patient's presentation is most consistent with acute complicated  illness / injury requiring diagnostic workup.   X-ray of the right hand and right wrist were independently reviewed and interpreted by me as being negative for any acute abnormality.  These were ordered from triage  Will do lab work to assess for gout/inflammation/infection  Patient was placed in a wrist brace for comfort   Patient's labs indicate inflammatory process.  Most are reassuring.  However she does have low potassium which will be corrected with a dose of potassium 40 M EQ's.   I did explain the x-ray findings and lab work to the patient.  Feel that she would be best served with a steroid Dosepak due to her Crohn's etc.  Have concerns about her taking a lot of NSAIDs.  Think patient also has a history of AKI in her past medical charts.  Patient is in agreement with this treatment plan.  She was discharged in stable condition.  Instructed follow-up with her regular doctor if not improving to 3 days.  Return emergency department if increased swelling or redness which could indicate septic arthritis.  See orthopedics if she feels that her regular doctor cannot help or if she is not  improving.   FINAL CLINICAL IMPRESSION(S) / ED DIAGNOSES   Final diagnoses:  Acute pain of right wrist  Elevated sed rate  Hypokalemia     Rx / DC Orders   ED Discharge Orders          Ordered    methylPREDNISolone (MEDROL DOSEPAK) 4 MG TBPK tablet        06/13/23 1610             Note:  This document was prepared using Dragon voice recognition software and may include unintentional dictation errors.    Faythe Ghee, PA-C 06/13/23 0940    Jene Every, MD 06/13/23 [phone]

## 2023-06-13 NOTE — ED Triage Notes (Signed)
Pt comes with c/o right hand pain and wrist. Pt states 9/10 pain. Pt states she can't move it the patient states she woke up yesterday and it was like this. Pt denies any known injuries.

## 2023-06-13 NOTE — ED Notes (Signed)
See triage notes. Patient stated she went to bed with her hand normal and woke up unable to move her right hand/wrist well. C/O pain and swelling.

## 2023-06-13 NOTE — Discharge Instructions (Signed)
Follow up with your regular doctor if not improving to 3 days.  Return emergency department if worsening.  Take medication as prescribed.

## 2023-06-15 DIAGNOSIS — K8301 Primary sclerosing cholangitis: Secondary | ICD-10-CM | POA: Diagnosis not present

## 2023-06-16 ENCOUNTER — Ambulatory Visit
Admission: RE | Admit: 2023-06-16 | Discharge: 2023-06-16 | Disposition: A | Discharge status: Home / Self Care | Payer: PPO | Source: Ambulatory Visit | Attending: Urology | Admitting: Urology

## 2023-06-16 DIAGNOSIS — R319 Hematuria, unspecified: Secondary | ICD-10-CM | POA: Insufficient documentation

## 2023-06-16 DIAGNOSIS — R935 Abnormal findings on diagnostic imaging of other abdominal regions, including retroperitoneum: Secondary | ICD-10-CM | POA: Diagnosis not present

## 2023-06-16 MED ORDER — IOHEXOL 300 MG/ML  SOLN
100.0000 mL | Freq: Once | INTRAMUSCULAR | Status: AC | PRN
  Administered 2023-06-16: 100 mL via INTRAVENOUS

## 2023-06-17 ENCOUNTER — Other Ambulatory Visit (HOSPITAL_COMMUNITY): Payer: Self-pay

## 2023-06-27 ENCOUNTER — Ambulatory Visit
Admission: RE | Admit: 2023-06-27 | Discharge: 2023-06-27 | Disposition: A | Discharge status: Home / Self Care | Payer: PPO | Source: Ambulatory Visit | Attending: Internal Medicine | Admitting: Internal Medicine

## 2023-06-27 ENCOUNTER — Telehealth: Payer: Self-pay

## 2023-06-27 DIAGNOSIS — Z1231 Encounter for screening mammogram for malignant neoplasm of breast: Secondary | ICD-10-CM | POA: Diagnosis not present

## 2023-06-27 NOTE — Telephone Encounter (Signed)
Transition Care Management Unsuccessful Follow-up Telephone Call  Date of discharge and from where:  [facility]  Attempts:  2nd Attempt  Reason for unsuccessful TCM follow-up call:  Left voice message   Tyreanna Bisesi [facility], [place] [phone] [address], [place], [place] [address] Phone: [phone] Email: [email or web address]       

## 2023-06-27 NOTE — Telephone Encounter (Signed)
Transition Care Management Unsuccessful Follow-up Telephone Call  Date of discharge and from where:  [facility] 6/24  Attempts:  1st Attempt  Reason for unsuccessful TCM follow-up call:  Left voice message   Lenard Forth [facility], [facility] [phone] [address] [place], [place], [place] [address] Phone: [phone] Email: [email or web address]

## 2023-07-05 ENCOUNTER — Encounter: Payer: Self-pay | Admitting: Gastroenterology

## 2023-07-07 ENCOUNTER — Encounter: Payer: Self-pay | Admitting: Urology

## 2023-07-07 ENCOUNTER — Ambulatory Visit (INDEPENDENT_AMBULATORY_CARE_PROVIDER_SITE_OTHER): Payer: PPO | Admitting: Urology

## 2023-07-07 VITALS — BP 175/82 | HR 98 | Ht 66.0 in | Wt 230.0 lb

## 2023-07-07 DIAGNOSIS — R319 Hematuria, unspecified: Secondary | ICD-10-CM

## 2023-07-07 DIAGNOSIS — N2889 Other specified disorders of kidney and ureter: Secondary | ICD-10-CM | POA: Diagnosis not present

## 2023-07-07 DIAGNOSIS — N281 Cyst of kidney, acquired: Secondary | ICD-10-CM

## 2023-07-07 LAB — MICROSCOPIC EXAMINATION

## 2023-07-07 LAB — URINALYSIS, COMPLETE
Bilirubin, UA: NEGATIVE
Glucose, UA: NEGATIVE
Leukocytes,UA: NEGATIVE
Nitrite, UA: NEGATIVE
Specific Gravity, UA: 1.02 (ref 1.005–1.030)
Urobilinogen, Ur: 0.2 mg/dL (ref 0.2–1.0)
pH, UA: 6 (ref 5.0–7.5)

## 2023-07-07 NOTE — Progress Notes (Signed)
   07/07/23  CC:  Chief Complaint  Patient presents with   Cysto    HPI: Refer to my prior office note 06/08/2023.  CT urogram performed 06/16/2023 showed an indeterminant 12 mm left and 12 mm left upper pole lesion with possible enhancement there was also a subtotal minimally hypoattenuating lesion in the subcortical left upper pole felt to possibly be a small complex cyst.  Urinalysis today negative RBCs  Blood pressure (!) 175/82, pulse 98, height 5\' 6"  (1.676 m), weight 230 lb (104.3 kg). NED. A&Ox3.   No respiratory distress   Abd soft, NT, ND Normal phallus with bilateral descended testicles  Cystoscopy Procedure Note  Patient identification was confirmed, informed consent was obtained, and patient was prepped using Betadine solution.  Lidocaine jelly was administered per urethral meatus.   Urethra normal in appearance  Procedure: - Flexible cystoscope introduced, without any difficulty.   - Thorough search of the bladder revealed:    normal urethral meatus    normal urothelium    no stones    no ulcers     no tumors    no urethral polyps    no trabeculation  - Ureteral orifices were normal in position and appearance.  Post-Procedure: - Patient tolerated the procedure well   Assessment/ Plan: No lower tract abnormalities on cystoscopy Indeterminate left renal lesion Schedule renal mass protocol MRI with/without contrast 3-4 months with follow-up    Riki Altes, MD

## 2023-07-12 ENCOUNTER — Other Ambulatory Visit (HOSPITAL_COMMUNITY): Payer: Self-pay

## 2023-07-20 ENCOUNTER — Other Ambulatory Visit: Payer: Self-pay

## 2023-08-16 ENCOUNTER — Other Ambulatory Visit (HOSPITAL_COMMUNITY): Payer: Self-pay

## 2023-08-25 ENCOUNTER — Other Ambulatory Visit (HOSPITAL_COMMUNITY): Payer: Self-pay

## 2023-09-10 ENCOUNTER — Encounter (HOSPITAL_COMMUNITY): Payer: Self-pay

## 2023-09-15 ENCOUNTER — Other Ambulatory Visit: Payer: Self-pay

## 2023-09-19 ENCOUNTER — Other Ambulatory Visit (HOSPITAL_COMMUNITY): Payer: Self-pay

## 2023-09-19 ENCOUNTER — Other Ambulatory Visit: Payer: Self-pay | Admitting: Pharmacy Technician

## 2023-09-19 ENCOUNTER — Other Ambulatory Visit: Payer: Self-pay

## 2023-09-19 MED ORDER — RINVOQ 15 MG PO TB24
ORAL_TABLET | ORAL | 3 refills | Status: DC
  Filled 2023-09-19: qty 30, 30d supply, fill #0
  Filled 2023-10-18: qty 30, 30d supply, fill #1
  Filled 2023-11-16: qty 30, 30d supply, fill #2
  Filled 2023-12-19: qty 30, 30d supply, fill #3

## 2023-09-19 MED ORDER — UPADACITINIB ER 15 MG PO TB24
ORAL_TABLET | ORAL | 3 refills | Status: AC
  Filled 2023-09-19 – 2024-01-19 (×2): qty 30, 30d supply, fill #0
  Filled 2024-02-16: qty 30, 30d supply, fill #1
  Filled 2024-03-20: qty 30, 30d supply, fill #2
  Filled 2024-04-25: qty 30, 30d supply, fill #3

## 2023-09-19 NOTE — Progress Notes (Signed)
Specialty Pharmacy Refill Coordination Note  Christy Summers is a 72 y.o. female contacted today regarding refills of specialty medication(s) Upadacitinib .  Patient requested Delivery  on 09/29/23  to verified address [address]   [place], [place]  [address]   Medication will be filled on 09/28/23.   RR sent to MD.

## 2023-09-28 ENCOUNTER — Other Ambulatory Visit: Payer: Self-pay

## 2023-10-10 ENCOUNTER — Ambulatory Visit
Admission: RE | Admit: 2023-10-10 | Discharge: 2023-10-10 | Disposition: A | Discharge status: Home / Self Care | Payer: PPO | Source: Ambulatory Visit | Attending: Urology | Admitting: Urology

## 2023-10-10 DIAGNOSIS — N2889 Other specified disorders of kidney and ureter: Secondary | ICD-10-CM | POA: Diagnosis not present

## 2023-10-10 DIAGNOSIS — K760 Fatty (change of) liver, not elsewhere classified: Secondary | ICD-10-CM | POA: Diagnosis not present

## 2023-10-10 DIAGNOSIS — N281 Cyst of kidney, acquired: Secondary | ICD-10-CM

## 2023-10-10 DIAGNOSIS — Z9049 Acquired absence of other specified parts of digestive tract: Secondary | ICD-10-CM | POA: Diagnosis not present

## 2023-10-10 DIAGNOSIS — N289 Disorder of kidney and ureter, unspecified: Secondary | ICD-10-CM | POA: Diagnosis not present

## 2023-10-10 MED ORDER — GADOBUTROL 1 MMOL/ML IV SOLN
10.0000 mL | Freq: Once | INTRAVENOUS | Status: AC | PRN
  Administered 2023-10-10: 10 mL via INTRAVENOUS

## 2023-10-13 ENCOUNTER — Other Ambulatory Visit (HOSPITAL_COMMUNITY): Payer: Self-pay

## 2023-10-18 ENCOUNTER — Other Ambulatory Visit (HOSPITAL_COMMUNITY): Payer: Self-pay

## 2023-10-18 ENCOUNTER — Encounter (HOSPITAL_COMMUNITY): Payer: Self-pay

## 2023-10-18 NOTE — Progress Notes (Signed)
Specialty Pharmacy Refill Coordination Note  Christy Summers is a 72 y.o. female contacted today regarding refills of specialty medication(s) Upadacitinib   Patient requested Delivery   Delivery date: 10/27/23   Verified address: [address]  [facility] [id number]   Medication will be filled on 11/06.

## 2023-10-18 NOTE — Progress Notes (Signed)
Specialty Pharmacy Ongoing Clinical Assessment Note  Christy Summers is a 73 y.o. female who is being followed by the specialty pharmacy service for RxSp Crohn's Disease   Patient's specialty medication(s) reviewed today: Upadacitinib   Missed doses in the last 4 weeks: 0   Patient/Caregiver did not have any additional questions or concerns.   Therapeutic benefit summary: Patient is achieving benefit   Adverse events/side effects summary: No adverse events/side effects   Patient's therapy is appropriate to: Continue    Goals Addressed             This Visit's Progress    Minimize recurrence of flares       Patient is on track. Patient will maintain adherence. [name] reports that she is well-controlled at this time.         Follow up:  6 months  Servando Snare Specialty Pharmacist

## 2023-10-19 ENCOUNTER — Encounter: Payer: Self-pay | Admitting: Urology

## 2023-10-19 ENCOUNTER — Ambulatory Visit (INDEPENDENT_AMBULATORY_CARE_PROVIDER_SITE_OTHER): Payer: PPO | Admitting: Urology

## 2023-10-19 VITALS — BP 155/83 | HR 96 | Ht 66.0 in | Wt 230.0 lb

## 2023-10-19 DIAGNOSIS — R3129 Other microscopic hematuria: Secondary | ICD-10-CM | POA: Diagnosis not present

## 2023-10-19 DIAGNOSIS — R319 Hematuria, unspecified: Secondary | ICD-10-CM

## 2023-10-19 DIAGNOSIS — N2889 Other specified disorders of kidney and ureter: Secondary | ICD-10-CM

## 2023-10-19 DIAGNOSIS — N281 Cyst of kidney, acquired: Secondary | ICD-10-CM

## 2023-10-19 LAB — URINALYSIS, COMPLETE
Bilirubin, UA: NEGATIVE
Glucose, UA: NEGATIVE
Ketones, UA: NEGATIVE
Leukocytes,UA: NEGATIVE
Nitrite, UA: NEGATIVE
Protein,UA: NEGATIVE
Specific Gravity, UA: 1.02 (ref 1.005–1.030)
Urobilinogen, Ur: 0.2 mg/dL (ref 0.2–1.0)
pH, UA: 6.5 (ref 5.0–7.5)

## 2023-10-19 LAB — MICROSCOPIC EXAMINATION

## 2023-10-22 ENCOUNTER — Encounter: Payer: Self-pay | Admitting: Urology

## 2023-10-26 ENCOUNTER — Other Ambulatory Visit: Payer: Self-pay

## 2023-10-27 DIAGNOSIS — K5000 Crohn's disease of small intestine without complications: Secondary | ICD-10-CM | POA: Diagnosis not present

## 2023-10-27 DIAGNOSIS — I10 Essential (primary) hypertension: Secondary | ICD-10-CM | POA: Diagnosis not present

## 2023-10-27 DIAGNOSIS — Z6837 Body mass index (BMI) 37.0-37.9, adult: Secondary | ICD-10-CM | POA: Diagnosis not present

## 2023-10-27 DIAGNOSIS — R739 Hyperglycemia, unspecified: Secondary | ICD-10-CM | POA: Diagnosis not present

## 2023-10-27 DIAGNOSIS — E559 Vitamin D deficiency, unspecified: Secondary | ICD-10-CM | POA: Diagnosis not present

## 2023-10-27 DIAGNOSIS — K8301 Primary sclerosing cholangitis: Secondary | ICD-10-CM | POA: Diagnosis not present

## 2023-10-27 DIAGNOSIS — R3129 Other microscopic hematuria: Secondary | ICD-10-CM | POA: Diagnosis not present

## 2023-10-27 DIAGNOSIS — D649 Anemia, unspecified: Secondary | ICD-10-CM | POA: Diagnosis not present

## 2023-10-27 DIAGNOSIS — Z8619 Personal history of other infectious and parasitic diseases: Secondary | ICD-10-CM | POA: Diagnosis not present

## 2023-10-28 DIAGNOSIS — R8290 Unspecified abnormal findings in urine: Secondary | ICD-10-CM | POA: Diagnosis not present

## 2023-11-04 ENCOUNTER — Other Ambulatory Visit: Payer: Self-pay

## 2023-11-04 DIAGNOSIS — R748 Abnormal levels of other serum enzymes: Secondary | ICD-10-CM | POA: Diagnosis not present

## 2023-11-04 DIAGNOSIS — R7989 Other specified abnormal findings of blood chemistry: Secondary | ICD-10-CM | POA: Diagnosis not present

## 2023-11-04 DIAGNOSIS — Z23 Encounter for immunization: Secondary | ICD-10-CM | POA: Diagnosis not present

## 2023-11-04 DIAGNOSIS — K8301 Primary sclerosing cholangitis: Secondary | ICD-10-CM | POA: Diagnosis not present

## 2023-11-04 DIAGNOSIS — I10 Essential (primary) hypertension: Secondary | ICD-10-CM | POA: Diagnosis not present

## 2023-11-04 DIAGNOSIS — N289 Disorder of kidney and ureter, unspecified: Secondary | ICD-10-CM | POA: Diagnosis not present

## 2023-11-04 DIAGNOSIS — Z6836 Body mass index (BMI) 36.0-36.9, adult: Secondary | ICD-10-CM | POA: Diagnosis not present

## 2023-11-04 DIAGNOSIS — R7309 Other abnormal glucose: Secondary | ICD-10-CM | POA: Diagnosis not present

## 2023-11-04 DIAGNOSIS — K529 Noninfective gastroenteritis and colitis, unspecified: Secondary | ICD-10-CM | POA: Diagnosis not present

## 2023-11-04 MED ORDER — AMLODIPINE BESYLATE 10 MG PO TABS
10.0000 mg | ORAL_TABLET | Freq: Every day | ORAL | 1 refills | Status: DC
  Filled 2023-11-04: qty 90, 90d supply, fill #0
  Filled 2024-01-28: qty 90, 90d supply, fill #1

## 2023-11-16 ENCOUNTER — Other Ambulatory Visit (HOSPITAL_COMMUNITY): Payer: Self-pay | Admitting: Pharmacy Technician

## 2023-11-16 ENCOUNTER — Other Ambulatory Visit (HOSPITAL_COMMUNITY): Payer: Self-pay

## 2023-11-16 NOTE — Progress Notes (Signed)
Specialty Pharmacy Refill Coordination Note  Christy Summers is a 72 y.o. female contacted today regarding refills of specialty medication(s) Upadacitinib   Patient requested Delivery   Delivery date: 11/30/23   Verified address: [address]   [place]   Medication will be filled on 11/29/23.

## 2023-11-29 ENCOUNTER — Other Ambulatory Visit: Payer: Self-pay

## 2023-12-19 ENCOUNTER — Other Ambulatory Visit: Payer: Self-pay

## 2023-12-19 ENCOUNTER — Other Ambulatory Visit (HOSPITAL_COMMUNITY): Payer: Self-pay

## 2023-12-19 NOTE — Progress Notes (Signed)
Specialty Pharmacy Refill Coordination Note  Christy Summers is a 72 y.o. female contacted today regarding refills of specialty medication(s) Upadacitinib (Rinvoq)   Patient requested Delivery   Delivery date: 12/27/23   Verified address: [address]  [place]   Medication will be filled on 01.06.25.

## 2023-12-26 ENCOUNTER — Other Ambulatory Visit: Payer: Self-pay

## 2024-01-19 ENCOUNTER — Other Ambulatory Visit: Payer: Self-pay

## 2024-01-19 NOTE — Progress Notes (Signed)
Specialty Pharmacy Refill Coordination Note  Christy Summers is a 72 y.o. female contacted today regarding refills of specialty medication(s) Upadacitinib (Rinvoq)   Patient requested Delivery   Delivery date: 01/26/24   Verified address: [address]  [place]   Medication will be filled on 01/25/24.

## 2024-01-25 ENCOUNTER — Other Ambulatory Visit: Payer: Self-pay

## 2024-01-28 ENCOUNTER — Other Ambulatory Visit: Payer: Self-pay

## 2024-01-29 ENCOUNTER — Other Ambulatory Visit: Payer: Self-pay

## 2024-02-02 ENCOUNTER — Other Ambulatory Visit: Payer: Self-pay

## 2024-02-02 DIAGNOSIS — K5000 Crohn's disease of small intestine without complications: Secondary | ICD-10-CM | POA: Diagnosis not present

## 2024-02-02 DIAGNOSIS — K8301 Primary sclerosing cholangitis: Secondary | ICD-10-CM | POA: Diagnosis not present

## 2024-02-02 MED ORDER — NA SULFATE-K SULFATE-MG SULF 17.5-3.13-1.6 GM/177ML PO SOLN
1.0000 | Freq: Once | ORAL | 0 refills | Status: DC
  Filled 2024-02-02: qty 354, 1d supply, fill #0

## 2024-02-06 ENCOUNTER — Other Ambulatory Visit: Payer: Self-pay

## 2024-02-16 ENCOUNTER — Other Ambulatory Visit: Payer: Self-pay

## 2024-02-16 ENCOUNTER — Other Ambulatory Visit: Payer: Self-pay | Admitting: Pharmacy Technician

## 2024-02-16 NOTE — Progress Notes (Signed)
 Specialty Pharmacy Refill Coordination Note  Christy Summers is a 73 y.o. female contacted today regarding refills of specialty medication(s) Upadacitinib (Rinvoq)   Patient requested Delivery   Delivery date: 02/24/24   Verified address: [address] [employer] [place]   Medication will be filled on 02/23/24.

## 2024-02-23 ENCOUNTER — Other Ambulatory Visit: Payer: Self-pay

## 2024-03-20 ENCOUNTER — Other Ambulatory Visit: Payer: Self-pay

## 2024-03-20 ENCOUNTER — Other Ambulatory Visit (HOSPITAL_COMMUNITY): Payer: Self-pay

## 2024-03-20 NOTE — Progress Notes (Signed)
 Specialty Pharmacy Ongoing Clinical Assessment Note  Christy Summers is a 73 y.o. female who is being followed by the specialty pharmacy service for RxSp Crohn's Disease   Patient's specialty medication(s) reviewed today: Upadacitinib (Rinvoq)   Missed doses in the last 4 weeks: 0   Patient/Caregiver did not have any additional questions or concerns.   Therapeutic benefit summary: Patient is achieving benefit   Adverse events/side effects summary: No adverse events/side effects   Patient's therapy is appropriate to: Continue    Goals Addressed             This Visit's Progress    Minimize recurrence of flares   On track    Patient is on track. Patient will maintain adherence. [name] reports that she is well-controlled at this time.         Follow up:  6 months  Servando Snare Specialty Pharmacist

## 2024-03-20 NOTE — Progress Notes (Signed)
 Specialty Pharmacy Refill Coordination Note  Christy Summers is a 73 y.o. female contacted today regarding refills of specialty medication(s) Upadacitinib (Rinvoq)   Patient requested Delivery   Delivery date: 03/30/24   Verified address: [address] [place]   Medication will be filled on 03/29/24.

## 2024-03-29 ENCOUNTER — Other Ambulatory Visit: Payer: Self-pay

## 2024-04-09 ENCOUNTER — Ambulatory Visit
Admission: RE | Admit: 2024-04-09 | Discharge: 2024-04-09 | Disposition: A | Discharge status: Home / Self Care | Payer: PPO | Source: Ambulatory Visit | Attending: Urology | Admitting: Urology

## 2024-04-09 DIAGNOSIS — N2889 Other specified disorders of kidney and ureter: Secondary | ICD-10-CM | POA: Diagnosis not present

## 2024-04-09 DIAGNOSIS — K760 Fatty (change of) liver, not elsewhere classified: Secondary | ICD-10-CM | POA: Diagnosis not present

## 2024-04-09 DIAGNOSIS — Z9049 Acquired absence of other specified parts of digestive tract: Secondary | ICD-10-CM | POA: Diagnosis not present

## 2024-04-09 DIAGNOSIS — N281 Cyst of kidney, acquired: Secondary | ICD-10-CM | POA: Diagnosis not present

## 2024-04-09 MED ORDER — IOHEXOL 300 MG/ML  SOLN: 100.0000 mL | Freq: Once | INTRAMUSCULAR | Status: DC | PRN

## 2024-04-09 MED ORDER — GADOBUTROL 1 MMOL/ML IV SOLN
10.0000 mL | Freq: Once | INTRAVENOUS | Status: AC | PRN
  Administered 2024-04-09: 10 mL via INTRAVENOUS

## 2024-04-13 ENCOUNTER — Encounter: Payer: Self-pay | Admitting: *Deleted

## 2024-04-20 ENCOUNTER — Ambulatory Visit (INDEPENDENT_AMBULATORY_CARE_PROVIDER_SITE_OTHER): Payer: Self-pay | Admitting: Urology

## 2024-04-20 ENCOUNTER — Encounter: Payer: Self-pay | Admitting: Urology

## 2024-04-20 VITALS — BP 146/85 | HR 87 | Ht 66.0 in | Wt 220.0 lb

## 2024-04-20 DIAGNOSIS — N2889 Other specified disorders of kidney and ureter: Secondary | ICD-10-CM

## 2024-04-20 NOTE — Progress Notes (Signed)
 I,[name],acting as a scribe for Christy Knapp, MD.,have documented all relevant documentation on the behalf of Christy Knapp, MD,as directed by  Christy Knapp, MD while in the presence of Christy Knapp, MD.  04/20/2024 4:18 PM   Tere Felts [birth year] [id number]  Referring provider: Antonio Baumgarten, MD [address] [facility] [place],  [place] [address]  Chief Complaint  Patient presents with   renal mass f/u   Urologic History 1. Microhematuria. -Seen June 2024 for high-risk microhematuria (32 RBCs)  -CT 05/2023 showed a 12 mm heterogeneous increased density lesion in the upper pole of the left kidney with a suggestion of enhancement. -Cystoscopy 06/2023 showed no lower-tract abnormalities.   2. Left renal mass -Follow-up MRI October 10/10/23 showed a 1.4 x 1.4 cm heterogeneously enhancing right renal mass appeared there were multiple benign Bosniak 1 cortical cysts, as well as a Bosniak 2 posterior superior pole left renal cyst.  HPI: Christy Summers is a 73 y.o. female presents for a 81month follow-up.  Remains asymptomatic and has no complaints since her last visit.  Denies gross hematuria.  MRI 04/09/2024 showed slight interverbal growths of her right upper pole mass from 1.4 x 1.4 cm to 1.6 x 1.4 cm.   PMH: Past Medical History:  Diagnosis Date   Anemia    normocytic   Bilateral ovarian cysts    only right ovarian cyst   Chronic diarrhea    Chronic ulcerative colitis, unspecified complication (HCC)    Crohn's disease (HCC)    Elevated lipids    History of Clostridioides difficile colitis 04/2020   pt. has been positive and negative since 2017. most recent result was negative. diarrhea persists   Hypertension    Neuromuscular disorder (HCC)    PSC (primary sclerosing cholangitis)     Surgical History: Past Surgical History:  Procedure Laterality Date   ABDOMINAL HYSTERECTOMY     CHOLECYSTECTOMY     COLONOSCOPY WITH  PROPOFOL  N/A 09/07/2017   Procedure: COLONOSCOPY WITH PROPOFOL ;  Surgeon: Toledo, Alphonsus Jeans, MD;  Location: [facility] ENDOSCOPY;  Service: Endoscopy;  Laterality: N/A;   COLONOSCOPY WITH PROPOFOL  N/A 01/22/2019   Procedure: COLONOSCOPY WITH PROPOFOL ;  Surgeon: Cassie Click, MD;  Location[facility] ENDOSCOPY;  Service: Endoscopy;  Laterality: N/A;   COLONOSCOPY WITH PROPOFOL  N/A 09/21/2021   Procedure: COLONOSCOPY WITH PROPOFOL ;  Surgeon: Shane Darling, MD;  Location: [facility] ENDOSCOPY;  Service: Endoscopy;  Laterality: N/A;   COLONOSCOPY WITH PROPOFOL  N/A 03/29/2023   Procedure: COLONOSCOPY WITH PROPOFOL ;  Surgeon: Shane Darling, MD;  Location: [facility] ENDOSCOPY;  Service: Endoscopy;  Laterality: N/A;   ERCP W/ SPHINCTEROTOMY AND BALLOON DILATION N/A    ESOPHAGOGASTRODUODENOSCOPY (EGD) WITH PROPOFOL  N/A 09/07/2017   Procedure: ESOPHAGOGASTRODUODENOSCOPY (EGD) WITH PROPOFOL ;  Surgeon: Toledo, Alphonsus Jeans, MD;  Location: [facility] ENDOSCOPY;  Service: Endoscopy;  Laterality: N/A;   TOTAL LAPAROSCOPIC HYSTERECTOMY WITH BILATERAL SALPINGO OOPHORECTOMY N/A 04/30/2020   Procedure: HYSTERECTOMY TOTAL LAPAROSCOPIC/BILATERAL SALPINGO OOPHORECTOMY POSSIBLE PELVIC/AORTIC LYMPH NODE DISSECTION, OMENTECTOMY, LAPAROTOMY;  Surgeon: Hermine Loots, MD;  Location: [facility] ORS;  Service: Gynecology;  Laterality: N/A;    Home Medications:  Allergies as of 04/20/2024       Reactions   Mesalamine Diarrhea, Nausea Only        Medication List        Accurate as of Apr 20, 2024  4:18 PM. If you have any questions, ask your nurse or doctor.  amLODipine  10 MG tablet Commonly known as: NORVASC  Take 1 tablet (10 mg total) by mouth daily. What changed: Another medication with the same name was removed. Continue taking this medication, and follow the directions you see here. Changed by: Christy Summers   aspirin  EC 81 MG tablet Take 81 mg by mouth daily.   Cholecalciferol 25 MCG (1000 UT) tablet Take  1,000 Units by mouth daily.   cyanocobalamin  1000 MCG tablet Take 1,000 mcg by mouth daily.   Rinvoq  15 MG Tb24 Generic drug: Upadacitinib  ER Take 15 mg by mouth once daily as needed What changed: Another medication with the same name was removed. Continue taking this medication, and follow the directions you see here. Changed by: Christy Summers   Tums Ultra 1000 1000 MG chewable tablet Generic drug: calcium elemental as carbonate Chew 1,000 mg by mouth daily.   Vitamin C 500 MG Caps Take 500 mg by mouth daily.        Allergies:  Allergies  Allergen Reactions   Mesalamine Diarrhea and Nausea Only    Family History: Family History  Problem Relation Age of Onset   Hypertension Mother    Stroke Mother    Stroke Sister    Hypertension Sister    Hypertension Brother    Breast cancer Neg Hx     Social History:  reports that she has never smoked. She has never used smokeless tobacco. She reports that she does not currently use alcohol . She reports that she does not currently use drugs.   Physical Exam: BP (!) 146/85   Pulse 87   Ht 5\' 6"  (1.676 m)   Wt 220 lb (99.8 kg)   BMI 35.51 kg/m   Constitutional:  Alert and oriented, No acute distress. HEENT: [place] AT, moist mucus membranes.  Trachea midline, no masses. Cardiovascular: No clubbing, cyanosis, or edema. Respiratory: Normal respiratory effort, no increased work of breathing. GI: Abdomen is soft, nontender, nondistended, no abdominal masses Skin: No rashes, bruises or suspicious lesions. Neurologic: Grossly intact, no focal deficits, moving all 4 extremities. Psychiatric: Normal mood and affect.   Pertinent imaging: MRI was personally reviewed and interpreted.   MRI  EXAM: MRI ABDOMEN WITHOUT AND WITH CONTRAST   TECHNIQUE: Multiplanar multisequence MR imaging of the abdomen was performed both before and after the administration of intravenous contrast.   CONTRAST:  10mL GADAVIST  GADOBUTROL  1 MMOL/ML IV  SOLN   COMPARISON:  10/10/2023   FINDINGS: Lower chest: No acute abnormality.   Hepatobiliary: No focal liver abnormality is seen. Hepatic steatosis. Status post cholecystectomy. No biliary dilatation.   Pancreas: Unremarkable. No pancreatic ductal dilatation or surrounding inflammatory changes.   Spleen: Normal in size without significant abnormality.   Adrenals/Urinary Tract: Adrenal glands are unremarkable. At most minimal enlargement of a contrast enhancing mass of the posterior superior pole of the right kidney, measuring 1.6 x 1.4 cm, previously 1.4 x 1.4 cm (series 4, image 15). Kidneys are otherwise normal without obvious calculi or hydronephrosis.   Stomach/Bowel: Stomach is within normal limits. No evidence of bowel wall thickening, distention, or inflammatory changes.   Vascular/Lymphatic: No significant vascular findings are present. No enlarged abdominal lymph nodes.   Other: No abdominal wall hernia or abnormality. No ascites.   Musculoskeletal: No acute or significant osseous findings.   IMPRESSION: 1. At most minimal enlargement of a contrast enhancing mass of the posterior superior pole of the right kidney, measuring 1.6 x 1.4 cm, previously 1.4 x 1.4 cm. This remains  consistent with a small, slowly growing renal cell carcinoma. 2. No evidence of renal vein invasion, lymphadenopathy, or metastatic disease in the abdomen. 3. Hepatic steatosis. 4. Status post cholecystectomy.     Electronically Signed   By: Fredricka Jenny M.D.   On: 04/11/2024 07:21  Assessment & Plan:    1. Right renal mass Mild interval growth, though less than 5 mm.  We again discussed options of continued surveillance, renal mass biopsy, percutaneous ablation, and surgical management.  She has elected to continue surveillance and will schedule a follow-up renal mass protocol MRI in 9 months with a follow-up office visit.  [facility] [address]  [address] [place], [place] [address] [phone]

## 2024-04-23 ENCOUNTER — Other Ambulatory Visit: Payer: Self-pay

## 2024-04-23 MED ORDER — AMLODIPINE BESYLATE 10 MG PO TABS
10.0000 mg | ORAL_TABLET | Freq: Every day | ORAL | 1 refills | Status: DC
  Filled 2024-04-23: qty 90, 90d supply, fill #0
  Filled 2024-07-26: qty 90, 90d supply, fill #1

## 2024-04-24 ENCOUNTER — Encounter: Payer: Self-pay | Admitting: *Deleted

## 2024-04-24 ENCOUNTER — Other Ambulatory Visit: Payer: Self-pay

## 2024-04-24 ENCOUNTER — Ambulatory Visit: Admitting: Anesthesiology

## 2024-04-24 ENCOUNTER — Ambulatory Visit
Admission: RE | Admit: 2024-04-24 | Discharge: 2024-04-24 | Disposition: A | Discharge status: Home / Self Care | Payer: PPO | Attending: Gastroenterology | Admitting: Gastroenterology

## 2024-04-24 ENCOUNTER — Encounter
Admission: RE | Disposition: A | Discharge status: Home / Self Care | Payer: Self-pay | Source: Home / Self Care | Attending: Gastroenterology

## 2024-04-24 DIAGNOSIS — G709 Myoneural disorder, unspecified: Secondary | ICD-10-CM | POA: Insufficient documentation

## 2024-04-24 DIAGNOSIS — K5150 Left sided colitis without complications: Secondary | ICD-10-CM | POA: Diagnosis not present

## 2024-04-24 DIAGNOSIS — K8301 Primary sclerosing cholangitis: Secondary | ICD-10-CM | POA: Insufficient documentation

## 2024-04-24 DIAGNOSIS — K523 Indeterminate colitis: Secondary | ICD-10-CM | POA: Insufficient documentation

## 2024-04-24 DIAGNOSIS — Z1211 Encounter for screening for malignant neoplasm of colon: Secondary | ICD-10-CM | POA: Insufficient documentation

## 2024-04-24 DIAGNOSIS — K5140 Inflammatory polyps of colon without complications: Secondary | ICD-10-CM | POA: Diagnosis not present

## 2024-04-24 DIAGNOSIS — K219 Gastro-esophageal reflux disease without esophagitis: Secondary | ICD-10-CM | POA: Diagnosis not present

## 2024-04-24 DIAGNOSIS — K5090 Crohn's disease, unspecified, without complications: Secondary | ICD-10-CM | POA: Insufficient documentation

## 2024-04-24 DIAGNOSIS — K5289 Other specified noninfective gastroenteritis and colitis: Secondary | ICD-10-CM | POA: Diagnosis not present

## 2024-04-24 DIAGNOSIS — I10 Essential (primary) hypertension: Secondary | ICD-10-CM | POA: Insufficient documentation

## 2024-04-24 DIAGNOSIS — Z8711 Personal history of peptic ulcer disease: Secondary | ICD-10-CM | POA: Diagnosis not present

## 2024-04-24 DIAGNOSIS — K635 Polyp of colon: Secondary | ICD-10-CM | POA: Diagnosis not present

## 2024-04-24 DIAGNOSIS — K529 Noninfective gastroenteritis and colitis, unspecified: Secondary | ICD-10-CM | POA: Diagnosis not present

## 2024-04-24 HISTORY — DX: Myoneural disorder, unspecified: G70.9

## 2024-04-24 HISTORY — DX: Malignant (primary) neoplasm, unspecified: C80.1

## 2024-04-24 HISTORY — PX: COLONOSCOPY WITH PROPOFOL: SHX5780

## 2024-04-24 HISTORY — DX: Ulcerative colitis, unspecified with unspecified complications: K51.919

## 2024-04-24 HISTORY — PX: POLYPECTOMY: SHX149

## 2024-04-24 HISTORY — DX: Primary sclerosing cholangitis: K83.01

## 2024-04-24 SURGERY — COLONOSCOPY WITH PROPOFOL
Anesthesia: General

## 2024-04-24 MED ORDER — LIDOCAINE HCL (CARDIAC) PF 100 MG/5ML IV SOSY
PREFILLED_SYRINGE | INTRAVENOUS | Status: DC | PRN
  Administered 2024-04-24: 80 mg via INTRAVENOUS

## 2024-04-24 MED ORDER — PHENYLEPHRINE 80 MCG/ML (10ML) SYRINGE FOR IV PUSH (FOR BLOOD PRESSURE SUPPORT)
PREFILLED_SYRINGE | INTRAVENOUS | Status: AC
Start: 2024-04-24 — End: ?
  Filled 2024-04-24: qty 10

## 2024-04-24 MED ORDER — DEXMEDETOMIDINE HCL IN NACL 80 MCG/20ML IV SOLN
INTRAVENOUS | Status: DC | PRN
Start: 2024-04-24 — End: 2024-04-24
  Administered 2024-04-24: 20 ug via INTRAVENOUS

## 2024-04-24 MED ORDER — EPHEDRINE SULFATE-NACL 50-0.9 MG/10ML-% IV SOSY
PREFILLED_SYRINGE | INTRAVENOUS | Status: DC | PRN
Start: 2024-04-24 — End: 2024-04-24
  Administered 2024-04-24: 10 mg via INTRAVENOUS
  Administered 2024-04-24: 15 mg via INTRAVENOUS

## 2024-04-24 MED ORDER — PROPOFOL 500 MG/50ML IV EMUL
INTRAVENOUS | Status: DC | PRN
  Administered 2024-04-24: 75 ug/kg/min via INTRAVENOUS

## 2024-04-24 MED ORDER — PROPOFOL 10 MG/ML IV BOLUS
INTRAVENOUS | Status: DC | PRN
  Administered 2024-04-24: 40 mg via INTRAVENOUS
  Administered 2024-04-24: 30 mg via INTRAVENOUS

## 2024-04-24 MED ORDER — LIDOCAINE HCL (PF) 2 % IJ SOLN
INTRAMUSCULAR | Status: AC
Start: 2024-04-24 — End: ?
  Filled 2024-04-24: qty 5

## 2024-04-24 MED ORDER — SODIUM CHLORIDE 0.9 % IV SOLN: INTRAVENOUS | Status: DC

## 2024-04-24 MED ORDER — PROPOFOL 1000 MG/100ML IV EMUL
INTRAVENOUS | Status: AC
  Filled 2024-04-24: qty 100

## 2024-04-24 NOTE — Anesthesia Preprocedure Evaluation (Signed)
 Anesthesia Evaluation  Patient identified by MRN, date of birth, ID band Patient awake    Reviewed: Allergy & Precautions, NPO status , Patient's Chart, lab work & pertinent test results  History of Anesthesia Complications Negative for: history of anesthetic complications  Airway Mallampati: III  TM Distance: >3 FB Neck ROM: full    Dental  (+) Chipped, Upper Dentures, Partial Lower   Pulmonary neg pulmonary ROS, neg shortness of breath   Pulmonary exam normal        Cardiovascular Exercise Tolerance: Good hypertension, (-) angina Normal cardiovascular exam     Neuro/Psych  Neuromuscular disease  negative psych ROS   GI/Hepatic Neg liver ROS, PUD,GERD  Controlled,,  Endo/Other  negative endocrine ROS    Renal/GU Renal disease  negative genitourinary   Musculoskeletal   Abdominal   Peds  Hematology negative hematology ROS (+)   Anesthesia Other Findings Past Medical History: No date: Anemia     Comment:  normocytic No date: Bilateral ovarian cysts     Comment:  only right ovarian cyst No date: Cancer (HCC)     Comment:  right kidney mass md watching stage No date: Chronic diarrhea No date: Chronic ulcerative colitis, unspecified complication (HCC) No date: Crohn's disease (HCC) No date: Elevated lipids 04/2020: History of Clostridioides difficile colitis     Comment:  pt. has been positive and negative since 2017. most               recent result was negative. diarrhea persists No date: Hypertension No date: Neuromuscular disorder (HCC) No date: PSC (primary sclerosing cholangitis)  Past Surgical History: No date: ABDOMINAL HYSTERECTOMY No date: CHOLECYSTECTOMY 09/07/2017: COLONOSCOPY WITH PROPOFOL ; N/A     Comment:  Procedure: COLONOSCOPY WITH PROPOFOL ;  Surgeon: Toledo,               Alphonsus Jeans, MD;  Location: [facility] ENDOSCOPY;  Service:               Endoscopy;  Laterality: N/A; 01/22/2019:  COLONOSCOPY WITH PROPOFOL ; N/A     Comment:  Procedure: COLONOSCOPY WITH PROPOFOL ;  Surgeon: Cassie Click, MD;  Location[facility] ENDOSCOPY;  Service:               Endoscopy;  Laterality: N/A; 09/21/2021: COLONOSCOPY WITH PROPOFOL ; N/A     Comment:  Procedure: COLONOSCOPY WITH PROPOFOL ;  Surgeon:               Shane Darling, MD;  Location: [facility] ENDOSCOPY;                Service: Endoscopy;  Laterality: N/A; 03/29/2023: COLONOSCOPY WITH PROPOFOL ; N/A     Comment:  Procedure: COLONOSCOPY WITH PROPOFOL ;  Surgeon:               Shane Darling, MD;  Location: [facility] ENDOSCOPY;                Service: Endoscopy;  Laterality: N/A; No date: ERCP W/ SPHINCTEROTOMY AND BALLOON DILATION; N/A 09/07/2017: ESOPHAGOGASTRODUODENOSCOPY (EGD) WITH PROPOFOL ; N/A     Comment:  Procedure: ESOPHAGOGASTRODUODENOSCOPY (EGD) WITH               PROPOFOL ;  Surgeon: Toledo, Alphonsus Jeans, MD;  Location:               [facility] ENDOSCOPY;  Service: Endoscopy;  Laterality: N/A; 04/30/2020: TOTAL LAPAROSCOPIC HYSTERECTOMY WITH BILATERAL SALPINGO  OOPHORECTOMY;  N/A     Comment:  Procedure: HYSTERECTOMY TOTAL LAPAROSCOPIC/BILATERAL               SALPINGO OOPHORECTOMY POSSIBLE PELVIC/AORTIC LYMPH NODE               DISSECTION, OMENTECTOMY, LAPAROTOMY;  Surgeon: Hermine Loots, MD;  Location: [facility] ORS;  Service: Gynecology;                Laterality: N/A;     Reproductive/Obstetrics negative OB ROS                             Anesthesia Physical Anesthesia Plan  ASA: 3  Anesthesia Plan: General   Post-op Pain Management:    Induction: Intravenous  PONV Risk Score and Plan: Propofol  infusion and TIVA  Airway Management Planned: Natural Airway and Nasal Cannula  Additional Equipment:   Intra-op Plan:   Post-operative Plan:   Informed Consent: I have reviewed the patients History and Physical, chart, labs and discussed the procedure including the  risks, benefits and alternatives for the proposed anesthesia with the patient or authorized representative who has indicated his/her understanding and acceptance.     Dental Advisory Given  Plan Discussed with: Anesthesiologist, CRNA and Surgeon  Anesthesia Plan Comments: (Patient consented for risks of anesthesia including but not limited to:  - adverse reactions to medications - risk of airway placement if required - damage to eyes, teeth, lips or other oral mucosa - nerve damage due to positioning  - sore throat or hoarseness - Damage to heart, brain, nerves, lungs, other parts of body or loss of life  Patient voiced understanding and assent.)       Anesthesia Quick Evaluation

## 2024-04-24 NOTE — Transfer of Care (Signed)
 Immediate Anesthesia Transfer of Care Note  Patient: Christy Summers  Procedure(s) Performed: COLONOSCOPY WITH PROPOFOL  POLYPECTOMY, INTESTINE  Patient Location: PACU  Anesthesia Type:General  Level of Consciousness: sedated  Airway & Oxygen Therapy: Patient Spontanous Breathing  Post-op Assessment: Report given to RN and Post -op Vital signs reviewed and stable  Post vital signs: Reviewed and stable  Last Vitals:  Vitals Value Taken Time  BP 102/53 04/24/24 0934  Temp    Pulse 83 04/24/24 0934  Resp 25 04/24/24 0934  SpO2 96 % 04/24/24 0934  Vitals shown include unfiled device data.  Last Pain:  Vitals:   04/24/24 0811  TempSrc: Temporal  PainSc: 0-No pain         Complications: No notable events documented.

## 2024-04-24 NOTE — H&P (Signed)
 Outpatient short stay form Pre-procedure 04/24/2024  Shane Darling, MD  Primary Physician: Antonio Baumgarten, MD  Reason for visit:  PSC + crohns, colon cancer screening  History of present illness:    73 y/o lady with PSC and crohns here for colonoscopy due to [facility] plus crohns. No blood thinners. History of cholecystectomy and hysterectomy. No family history of GI malignancies.     Current Facility-Administered Medications:    0.9 %  sodium chloride  infusion, , Intravenous, Continuous, Koral Thaden, Leanora Prophet, MD, Last Rate: 20 mL/hr at 04/24/24 1610, Continued from Pre-op at 04/24/24 0829  Medications Prior to Admission  Medication Sig Dispense Refill Last Dose/Taking   amLODipine  (NORVASC ) 10 MG tablet Take 1 tablet (10 mg total) by mouth daily. 90 tablet 1 04/24/2024 Morning   Ascorbic Acid (VITAMIN C) 500 MG CAPS Take 500 mg by mouth daily.    Past Week   aspirin  81 MG EC tablet Take 81 mg by mouth daily.    Past Week   calcium elemental as carbonate (TUMS ULTRA 1000) 400 MG chewable tablet Chew 1,000 mg by mouth daily.   Past Week   Cholecalciferol 25 MCG (1000 UT) tablet Take 1,000 Units by mouth daily.    Past Week   cyanocobalamin  1000 MCG tablet Take 1,000 mcg by mouth daily.   Past Week   Upadacitinib  ER (RINVOQ ) 15 MG TB24 Take 15 mg by mouth once daily as needed 30 tablet 3 Past Week     Allergies  Allergen Reactions   Mesalamine Diarrhea and Nausea Only     Past Medical History:  Diagnosis Date   Anemia    normocytic   Bilateral ovarian cysts    only right ovarian cyst   Cancer (HCC)    right kidney mass md watching stage   Chronic diarrhea    Chronic ulcerative colitis, unspecified complication (HCC)    Crohn's disease (HCC)    Elevated lipids    History of Clostridioides difficile colitis 04/2020   pt. has been positive and negative since 2017. most recent result was negative. diarrhea persists   Hypertension    Neuromuscular disorder (HCC)    PSC  (primary sclerosing cholangitis)     Review of systems:  Otherwise negative.    Physical Exam  Gen: Alert, oriented. Appears stated age.  HEENT:  PERRLA. Lungs: No respiratory distress CV: RRR Abd: soft, benign, no masses Ext: No edema    Planned procedures: Proceed with colonoscopy. The patient understands the nature of the planned procedure, indications, risks, alternatives and potential complications including but not limited to bleeding, infection, perforation, damage to internal organs and possible oversedation/side effects from anesthesia. The patient agrees and gives consent to proceed.  Please refer to procedure notes for findings, recommendations and patient disposition/instructions.     Shane Darling, MD [facility]

## 2024-04-24 NOTE — Interval H&P Note (Signed)
 History and Physical Interval Note:  04/24/2024 9:05 AM  Christy Summers  has presented today for surgery, with the diagnosis of PSC Crohns Disease.  The various methods of treatment have been discussed with the patient and family. After consideration of risks, benefits and other options for treatment, the patient has consented to  Procedure(s): COLONOSCOPY WITH PROPOFOL  (N/A) as a surgical intervention.  The patient's history has been reviewed, patient examined, no change in status, stable for surgery.  I have reviewed the patient's chart and labs.  Questions were answered to the patient's satisfaction.     Shane Darling  Ok to proceed with colonoscopy

## 2024-04-24 NOTE — Op Note (Signed)
 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 Estimated blood loss:                         Minimal. Procedure:             Pre-Anesthesia Assessment:                        - Prior to the procedure, a History and Physical was                         performed, and patient medications and allergies were                         reviewed. The patient is competent. The risks and                         benefits of the procedure and the sedation options and                         risks were discussed with the patient. All questions                         were answered and informed consent was obtained.                         Patient identification and proposed procedure were                         verified by the physician, the nurse, the                         anesthesiologist, the anesthetist and the technician                         in the endoscopy suite. Mental Status Examination:                         alert and oriented. Airway Examination: normal                         oropharyngeal airway and neck mobility. Respiratory                         Examination: clear to auscultation. CV Examination:  normal. Prophylactic Antibiotics: The patient does not                         require prophylactic antibiotics. Prior                         Anticoagulants: The patient has  taken no anticoagulant                         or antiplatelet agents. ASA Grade Assessment: III - A                         patient with severe systemic disease. After reviewing                         the risks and benefits, the patient was deemed in                         satisfactory condition to undergo the procedure. The                         anesthesia plan was to use monitored anesthesia care                         (MAC). Immediately prior to administration of                         medications, the patient was re-assessed for adequacy                         to receive sedatives. The heart rate, respiratory                         rate, oxygen saturations, blood pressure, adequacy of                         pulmonary ventilation, and response to care were                         monitored throughout the procedure. The physical                         status of the patient was re-assessed after the                         procedure.                        After obtaining informed consent, the colonoscope was                         passed under direct vision. Throughout the procedure,                         the patient's blood pressure, pulse, and oxygen                         saturations were monitored continuously. The  Colonoscope was introduced through the anus and                         advanced to the the terminal ileum, with                         identification of the appendiceal orifice and IC                         valve. The colonoscopy was performed without                         difficulty. The patient tolerated the procedure well.                         The quality of the bowel preparation was good. The                         terminal ileum, ileocecal valve, appendiceal orifice,                         and rectum were photographed. Findings:      The perianal and digital rectal examinations were normal.      The terminal ileum  appeared normal. Biopsies were taken with a cold       forceps for histology. Estimated blood loss was minimal.      The Simple Endoscopic Score for Crohn's Disease was determined based on       the endoscopic appearance of the mucosa in the following segments:      - Ileum: Findings include no ulcers present, no ulcerated surfaces, no       affected surfaces and no narrowings. Segment score: 0.      - Right Colon: Findings include aphthous ulcers less than 0.5 cm in       size, greater than 30% ulcerated surfaces, greater than 75% of surfaces       affected and no narrowings. Segment score: 7.      - Transverse Colon: Findings include aphthous ulcers less than 0.5 cm in       size, greater than 30% ulcerated surfaces and greater than 75% of       surfaces affected. Segment score: 7.      - Left Colon: Findings include aphthous ulcers less than 0.5 cm in size,       greater than 30% ulcerated surfaces, greater than 75% of surfaces       affected and no narrowings. Segment score: 7.      - Rectum: Findings include no ulcers present, no ulcerated surfaces, no       affected surfaces and no narrowings. Segment score: 0.      - Total SES-CD aggregate score: 21. Biopsies were taken with a cold       forceps for histology. Estimated blood loss: none.      A 2 mm polyp was found in the transverse colon. The polyp was sessile.       The polyp was removed with a cold snare. Resection and retrieval were       complete. Estimated blood loss was minimal.      A tattoo was seen in the sigmoid colon. A polyp was overtop of the  tattoo. The polyp was removed with a cold snare. Resection and retrieval       were complete. Estimated blood loss was minimal. The polyp that was in       this area on last colonoscopy was large but was inflammatory.      A 3 mm polyp was found in the sigmoid colon. The polyp was sessile. The       polyp was removed with a cold snare. Resection and retrieval were        complete. Estimated blood loss was minimal.      The exam was otherwise without abnormality on direct and retroflexion       views. Impression:            - The examined portion of the ileum was normal.                         Biopsied.                        - Simple Endoscopic Score for Crohn's Disease: 21,                         mucosal inflammatory changes secondary to Crohn's                         disease. Biopsied.                        - One 2 mm polyp in the transverse colon, removed with                         a cold snare. Resected and retrieved.                        - A tattoo was seen in the sigmoid colon. A polyp was                         overtop of the tattoo.                        - One 3 mm polyp in the sigmoid colon, removed with a                         cold snare. Resected and retrieved.                        - The examination was otherwise normal on direct and                         retroflexion views. Recommendation:        - Discharge patient to home.                        - Resume previous diet.                        - Continue present medications.                        - Await pathology results.                        -  Repeat colonoscopy in 1 year for surveillance.                        - Return to referring physician as previously                         scheduled.                        - Anticipate having to increase upadacitinib  to 30 mg                         daily. Procedure Code(s):     --- Professional ---                        [id number], Colonoscopy, flexible; with removal of                         tumor(s), polyp(s), or other lesion(s) by snare                         technique                        45380, 59, Colonoscopy, flexible; with biopsy, single                         or multiple Diagnosis Code(s):     --- Professional ---                        K52.3, Indeterminate colitis                        K83.01, Primary sclerosing  cholangitis                        K50.90, Crohn's disease, unspecified, without                         complications                        D12.3, Benign neoplasm of transverse colon (hepatic                         flexure or splenic flexure)                        D12.5, Benign neoplasm of sigmoid colon CPT copyright 2022 American [employer]. All rights reserved. The codes documented in this report are preliminary and upon coder review may  be revised to meet current compliance requirements. Leida Puna MD, MD 04/24/2024 9:41:19 AM Number of Addenda: 0 Note Initiated On: 04/24/2024 8:51 AM Scope Withdrawal Time: 0 hours 11 minutes 23 seconds  Total Procedure Duration: 0 hours 17 minutes 21 seconds  Estimated Blood Loss:  Estimated blood loss was minimal.      [facility]

## 2024-04-24 NOTE — Anesthesia Postprocedure Evaluation (Signed)
 Anesthesia Post Note  Patient: Christy Summers  Procedure(s) Performed: COLONOSCOPY WITH PROPOFOL  POLYPECTOMY, INTESTINE  Patient location during evaluation: Endoscopy Anesthesia Type: General Level of consciousness: awake and alert Pain management: pain level controlled Vital Signs Assessment: post-procedure vital signs reviewed and stable Respiratory status: spontaneous breathing, nonlabored ventilation, respiratory function stable and patient connected to nasal cannula oxygen Cardiovascular status: blood pressure returned to baseline and stable Postop Assessment: no apparent nausea or vomiting Anesthetic complications: no   No notable events documented.   Last Vitals:  Vitals:   04/24/24 0811 04/24/24 0934  BP: (!) 151/84   Pulse: 81   Resp: 16   Temp: (!) 36.1 C (!) 35.9 C  SpO2: 95%     Last Pain:  Vitals:   04/24/24 0954  TempSrc:   PainSc: 0-No pain                 Portia Brittle Nelson Julson

## 2024-04-25 ENCOUNTER — Other Ambulatory Visit: Payer: Self-pay | Admitting: Pharmacy Technician

## 2024-04-25 ENCOUNTER — Other Ambulatory Visit: Payer: Self-pay

## 2024-04-25 LAB — SURGICAL PATHOLOGY

## 2024-04-25 NOTE — Progress Notes (Signed)
 Specialty Pharmacy Refill Coordination Note  Christy Summers is a 73 y.o. female contacted today regarding refills of specialty medication(s) Upadacitinib    Patient requested Delivery   Delivery date: 05/07/24   Verified address: [address]  [place]  [address]   Medication will be filled on 05/04/24.

## 2024-05-01 DIAGNOSIS — Z6836 Body mass index (BMI) 36.0-36.9, adult: Secondary | ICD-10-CM | POA: Diagnosis not present

## 2024-05-01 DIAGNOSIS — Z79899 Other long term (current) drug therapy: Secondary | ICD-10-CM | POA: Diagnosis not present

## 2024-05-01 DIAGNOSIS — N289 Disorder of kidney and ureter, unspecified: Secondary | ICD-10-CM | POA: Diagnosis not present

## 2024-05-01 DIAGNOSIS — R748 Abnormal levels of other serum enzymes: Secondary | ICD-10-CM | POA: Diagnosis not present

## 2024-05-01 DIAGNOSIS — I10 Essential (primary) hypertension: Secondary | ICD-10-CM | POA: Diagnosis not present

## 2024-05-01 DIAGNOSIS — K529 Noninfective gastroenteritis and colitis, unspecified: Secondary | ICD-10-CM | POA: Diagnosis not present

## 2024-05-01 DIAGNOSIS — K8301 Primary sclerosing cholangitis: Secondary | ICD-10-CM | POA: Diagnosis not present

## 2024-05-01 DIAGNOSIS — R7989 Other specified abnormal findings of blood chemistry: Secondary | ICD-10-CM | POA: Diagnosis not present

## 2024-05-01 DIAGNOSIS — R7309 Other abnormal glucose: Secondary | ICD-10-CM | POA: Diagnosis not present

## 2024-05-02 DIAGNOSIS — I10 Essential (primary) hypertension: Secondary | ICD-10-CM | POA: Diagnosis not present

## 2024-05-02 DIAGNOSIS — Z79899 Other long term (current) drug therapy: Secondary | ICD-10-CM | POA: Diagnosis not present

## 2024-05-02 DIAGNOSIS — Z6836 Body mass index (BMI) 36.0-36.9, adult: Secondary | ICD-10-CM | POA: Diagnosis not present

## 2024-05-02 DIAGNOSIS — K8301 Primary sclerosing cholangitis: Secondary | ICD-10-CM | POA: Diagnosis not present

## 2024-05-02 DIAGNOSIS — K529 Noninfective gastroenteritis and colitis, unspecified: Secondary | ICD-10-CM | POA: Diagnosis not present

## 2024-05-02 DIAGNOSIS — R7989 Other specified abnormal findings of blood chemistry: Secondary | ICD-10-CM | POA: Diagnosis not present

## 2024-05-02 DIAGNOSIS — R748 Abnormal levels of other serum enzymes: Secondary | ICD-10-CM | POA: Diagnosis not present

## 2024-05-02 DIAGNOSIS — R7309 Other abnormal glucose: Secondary | ICD-10-CM | POA: Diagnosis not present

## 2024-05-02 DIAGNOSIS — N289 Disorder of kidney and ureter, unspecified: Secondary | ICD-10-CM | POA: Diagnosis not present

## 2024-05-08 DIAGNOSIS — R7309 Other abnormal glucose: Secondary | ICD-10-CM | POA: Diagnosis not present

## 2024-05-08 DIAGNOSIS — R7989 Other specified abnormal findings of blood chemistry: Secondary | ICD-10-CM | POA: Diagnosis not present

## 2024-05-08 DIAGNOSIS — Z6834 Body mass index (BMI) 34.0-34.9, adult: Secondary | ICD-10-CM | POA: Diagnosis not present

## 2024-05-08 DIAGNOSIS — D649 Anemia, unspecified: Secondary | ICD-10-CM | POA: Diagnosis not present

## 2024-05-08 DIAGNOSIS — I358 Other nonrheumatic aortic valve disorders: Secondary | ICD-10-CM | POA: Diagnosis not present

## 2024-05-08 DIAGNOSIS — K50919 Crohn's disease, unspecified, with unspecified complications: Secondary | ICD-10-CM | POA: Diagnosis not present

## 2024-05-08 DIAGNOSIS — Z1331 Encounter for screening for depression: Secondary | ICD-10-CM | POA: Diagnosis not present

## 2024-05-08 DIAGNOSIS — Z0000 Encounter for general adult medical examination without abnormal findings: Secondary | ICD-10-CM | POA: Diagnosis not present

## 2024-05-08 DIAGNOSIS — E669 Obesity, unspecified: Secondary | ICD-10-CM | POA: Diagnosis not present

## 2024-05-10 ENCOUNTER — Other Ambulatory Visit (HOSPITAL_COMMUNITY): Payer: Self-pay

## 2024-05-10 ENCOUNTER — Other Ambulatory Visit: Payer: Self-pay

## 2024-05-10 MED ORDER — RINVOQ 30 MG PO TB24
ORAL_TABLET | ORAL | 3 refills | Status: DC
Start: 2024-05-10 — End: 2024-08-27
  Filled 2024-05-10: qty 30, 30d supply, fill #0
  Filled 2024-06-05 – 2024-06-06 (×2): qty 30, 30d supply, fill #1
  Filled 2024-06-28: qty 30, 30d supply, fill #2
  Filled 2024-07-24: qty 30, 30d supply, fill #3

## 2024-05-10 NOTE — Progress Notes (Signed)
 Specialty Pharmacy Refill Coordination Note  Christy Summers is a 74 y.o. female contacted today regarding refills of specialty medication(s) Upadacitinib  (Rinvoq )   Patient requested Delivery   Delivery date: 05/11/24   Verified address: [address]  [place]  [address]   Medication will be filled on 05.23.25.

## 2024-05-15 ENCOUNTER — Other Ambulatory Visit: Payer: Self-pay | Admitting: Internal Medicine

## 2024-05-15 DIAGNOSIS — Z1231 Encounter for screening mammogram for malignant neoplasm of breast: Secondary | ICD-10-CM

## 2024-05-28 DIAGNOSIS — I358 Other nonrheumatic aortic valve disorders: Secondary | ICD-10-CM | POA: Diagnosis not present

## 2024-05-28 DIAGNOSIS — Z1331 Encounter for screening for depression: Secondary | ICD-10-CM | POA: Diagnosis not present

## 2024-06-05 ENCOUNTER — Other Ambulatory Visit: Payer: Self-pay

## 2024-06-05 ENCOUNTER — Encounter (INDEPENDENT_AMBULATORY_CARE_PROVIDER_SITE_OTHER): Payer: Self-pay

## 2024-06-06 ENCOUNTER — Other Ambulatory Visit: Payer: Self-pay

## 2024-06-06 NOTE — Progress Notes (Signed)
 Specialty Pharmacy Refill Coordination Note  Christy Summers is a 73 y.o. female contacted today regarding refills of specialty medication(s) Upadacitinib    Patient requested Delivery   Delivery date: 06/07/24   Verified address: [address] [place]  [place]  [address]   Medication will be filled on 06/06/24.

## 2024-06-20 ENCOUNTER — Other Ambulatory Visit: Payer: Self-pay

## 2024-06-26 ENCOUNTER — Other Ambulatory Visit: Payer: Self-pay

## 2024-06-27 ENCOUNTER — Ambulatory Visit
Admission: RE | Admit: 2024-06-27 | Discharge: 2024-06-27 | Disposition: A | Discharge status: Home / Self Care | Source: Ambulatory Visit | Attending: Internal Medicine | Admitting: Internal Medicine

## 2024-06-27 DIAGNOSIS — Z1231 Encounter for screening mammogram for malignant neoplasm of breast: Secondary | ICD-10-CM | POA: Diagnosis not present

## 2024-06-28 ENCOUNTER — Other Ambulatory Visit: Payer: Self-pay

## 2024-06-28 ENCOUNTER — Encounter (INDEPENDENT_AMBULATORY_CARE_PROVIDER_SITE_OTHER): Payer: Self-pay

## 2024-06-28 ENCOUNTER — Other Ambulatory Visit: Payer: Self-pay | Admitting: Pharmacy Technician

## 2024-06-28 NOTE — Progress Notes (Signed)
 Specialty Pharmacy Refill Coordination Note  Christy Summers is a 73 y.o. female contacted today regarding refills of specialty medication(s) Upadacitinib    Patient requested (Patient-Rptd) Delivery   Delivery date: 07/06/2024 Verified address: (Patient-Rptd) [address]  [place] [address]   Medication will be filled on 07/05/2024.

## 2024-07-24 ENCOUNTER — Other Ambulatory Visit: Payer: Self-pay | Admitting: Pharmacy Technician

## 2024-07-24 ENCOUNTER — Other Ambulatory Visit: Payer: Self-pay

## 2024-07-24 ENCOUNTER — Encounter (INDEPENDENT_AMBULATORY_CARE_PROVIDER_SITE_OTHER): Payer: Self-pay

## 2024-07-24 NOTE — Progress Notes (Signed)
 Specialty Pharmacy Refill Coordination Note  Christy Summers is a 73 y.o. female contacted today regarding refills of specialty medication(s) Upadacitinib  (Rinvoq )   Patient requested (Patient-Rptd) Delivery   Delivery date: 08/02/24 Verified address: (Patient-Rptd) [address]  [place]  [place] [address]   Medication will be filled on 08/01/24.

## 2024-07-26 ENCOUNTER — Other Ambulatory Visit: Payer: Self-pay

## 2024-07-27 ENCOUNTER — Other Ambulatory Visit: Payer: Self-pay

## 2024-07-31 DIAGNOSIS — Z79899 Other long term (current) drug therapy: Secondary | ICD-10-CM | POA: Diagnosis not present

## 2024-07-31 DIAGNOSIS — K8301 Primary sclerosing cholangitis: Secondary | ICD-10-CM | POA: Diagnosis not present

## 2024-08-01 ENCOUNTER — Other Ambulatory Visit: Payer: Self-pay

## 2024-08-01 ENCOUNTER — Other Ambulatory Visit: Payer: Self-pay | Admitting: Gastroenterology

## 2024-08-01 DIAGNOSIS — K8301 Primary sclerosing cholangitis: Secondary | ICD-10-CM

## 2024-08-03 ENCOUNTER — Other Ambulatory Visit: Payer: Self-pay | Admitting: Gastroenterology

## 2024-08-03 DIAGNOSIS — K8301 Primary sclerosing cholangitis: Secondary | ICD-10-CM

## 2024-08-06 ENCOUNTER — Ambulatory Visit
Admission: RE | Admit: 2024-08-06 | Discharge: 2024-08-06 | Disposition: A | Discharge status: Home / Self Care | Source: Ambulatory Visit | Attending: Gastroenterology | Admitting: Gastroenterology

## 2024-08-06 ENCOUNTER — Other Ambulatory Visit: Payer: Self-pay | Admitting: Gastroenterology

## 2024-08-06 DIAGNOSIS — K8689 Other specified diseases of pancreas: Secondary | ICD-10-CM | POA: Insufficient documentation

## 2024-08-06 DIAGNOSIS — K8301 Primary sclerosing cholangitis: Secondary | ICD-10-CM | POA: Insufficient documentation

## 2024-08-06 DIAGNOSIS — R109 Unspecified abdominal pain: Secondary | ICD-10-CM | POA: Diagnosis not present

## 2024-08-06 DIAGNOSIS — Z9049 Acquired absence of other specified parts of digestive tract: Secondary | ICD-10-CM | POA: Diagnosis not present

## 2024-08-06 DIAGNOSIS — N281 Cyst of kidney, acquired: Secondary | ICD-10-CM | POA: Diagnosis not present

## 2024-08-06 DIAGNOSIS — K760 Fatty (change of) liver, not elsewhere classified: Secondary | ICD-10-CM | POA: Diagnosis not present

## 2024-08-06 MED ORDER — GADOBUTROL 1 MMOL/ML IV SOLN
10.0000 mL | Freq: Once | INTRAVENOUS | Status: AC | PRN
  Administered 2024-08-06: 10 mL via INTRAVENOUS

## 2024-08-24 ENCOUNTER — Other Ambulatory Visit: Payer: Self-pay

## 2024-08-24 ENCOUNTER — Other Ambulatory Visit: Payer: Self-pay | Admitting: Pharmacy Technician

## 2024-08-24 ENCOUNTER — Encounter (INDEPENDENT_AMBULATORY_CARE_PROVIDER_SITE_OTHER): Payer: Self-pay

## 2024-08-24 NOTE — Progress Notes (Signed)
 Specialty Pharmacy Refill Coordination Note  Christy Summers is a 73 y.o. female contacted today regarding refills of specialty medication(s) Upadacitinib  (Rinvoq )   Patient requested (Patient-Rptd) Delivery   Delivery date: 09/03/24 Verified address: (Patient-Rptd) [address] [place]  [place]  [address]   Medication will be filled on 08/31/24.   RR sent to MD

## 2024-08-27 ENCOUNTER — Other Ambulatory Visit: Payer: Self-pay

## 2024-08-27 ENCOUNTER — Other Ambulatory Visit (HOSPITAL_COMMUNITY): Payer: Self-pay

## 2024-08-27 MED ORDER — UPADACITINIB ER 30 MG PO TB24
ORAL_TABLET | ORAL | 3 refills | Status: DC
  Filled 2024-08-27: qty 30, 30d supply, fill #0
  Filled 2024-09-20: qty 30, 30d supply, fill #1
  Filled 2024-10-24: qty 30, 30d supply, fill #2
  Filled 2024-11-23: qty 30, 30d supply, fill #3

## 2024-08-31 ENCOUNTER — Other Ambulatory Visit: Payer: Self-pay

## 2024-09-10 ENCOUNTER — Other Ambulatory Visit: Payer: Self-pay

## 2024-09-20 ENCOUNTER — Other Ambulatory Visit: Payer: Self-pay | Admitting: Pharmacy Technician

## 2024-09-20 ENCOUNTER — Other Ambulatory Visit: Payer: Self-pay

## 2024-09-20 ENCOUNTER — Encounter (INDEPENDENT_AMBULATORY_CARE_PROVIDER_SITE_OTHER): Payer: Self-pay

## 2024-09-20 NOTE — Progress Notes (Signed)
 Specialty Pharmacy Refill Coordination Note  Christy Summers is a 73 y.o. female contacted today regarding refills of specialty medication(s) Upadacitinib  (Rinvoq )   Patient requested (Patient-Rptd) Delivery   Delivery date: 10/02/24 Verified address: (Patient-Rptd) [address]  [place], [place]  [address]   Medication will be filled on 10/01/24.

## 2024-10-19 ENCOUNTER — Other Ambulatory Visit: Payer: Self-pay

## 2024-10-19 MED ORDER — AMLODIPINE BESYLATE 10 MG PO TABS
10.0000 mg | ORAL_TABLET | Freq: Every day | ORAL | 1 refills | Status: AC
  Filled 2024-10-19: qty 90, 90d supply, fill #0

## 2024-10-24 ENCOUNTER — Other Ambulatory Visit: Payer: Self-pay

## 2024-10-25 ENCOUNTER — Other Ambulatory Visit: Payer: Self-pay | Admitting: Pharmacy Technician

## 2024-10-25 ENCOUNTER — Other Ambulatory Visit: Payer: Self-pay

## 2024-10-25 ENCOUNTER — Encounter (INDEPENDENT_AMBULATORY_CARE_PROVIDER_SITE_OTHER): Payer: Self-pay

## 2024-10-25 NOTE — Progress Notes (Signed)
 Specialty Pharmacy Refill Coordination Note  Christy Summers is a 73 y.o. female contacted today regarding refills of specialty medication(s) Upadacitinib  (Rinvoq )   Patient requested (Patient-Rptd) Delivery   Delivery date: 11/01/2024 Verified address: (Patient-Rptd) [address]   [place], [place]  [address]   Medication will be filled on: 10/31/2024

## 2024-10-31 ENCOUNTER — Other Ambulatory Visit: Payer: Self-pay

## 2024-11-23 ENCOUNTER — Other Ambulatory Visit: Payer: Self-pay | Admitting: Pharmacy Technician

## 2024-11-23 ENCOUNTER — Other Ambulatory Visit: Payer: Self-pay

## 2024-11-23 ENCOUNTER — Other Ambulatory Visit (HOSPITAL_COMMUNITY): Payer: Self-pay

## 2024-11-23 NOTE — Progress Notes (Signed)
 Specialty Pharmacy Refill Coordination Note  Christy Summers is a 73 y.o. female contacted today regarding refills of specialty medication(s)Upadacitinib  (Rinvoq )    Patient requested (Patient-Rptd) Delivery   Delivery date:11/30/2024 Verified address: (Patient-Rptd) [address]  [place], [place] [address]   Medication will be filled on: 11/29/2024

## 2024-11-28 DIAGNOSIS — K5000 Crohn's disease of small intestine without complications: Secondary | ICD-10-CM | POA: Diagnosis not present

## 2024-11-28 DIAGNOSIS — K8301 Primary sclerosing cholangitis: Secondary | ICD-10-CM | POA: Diagnosis not present

## 2024-11-28 DIAGNOSIS — R7989 Other specified abnormal findings of blood chemistry: Secondary | ICD-10-CM | POA: Diagnosis not present

## 2024-11-28 DIAGNOSIS — R7309 Other abnormal glucose: Secondary | ICD-10-CM | POA: Diagnosis not present

## 2024-11-28 DIAGNOSIS — Z6834 Body mass index (BMI) 34.0-34.9, adult: Secondary | ICD-10-CM | POA: Diagnosis not present

## 2024-11-28 DIAGNOSIS — D649 Anemia, unspecified: Secondary | ICD-10-CM | POA: Diagnosis not present

## 2024-11-28 DIAGNOSIS — K50919 Crohn's disease, unspecified, with unspecified complications: Secondary | ICD-10-CM | POA: Diagnosis not present

## 2024-11-29 ENCOUNTER — Other Ambulatory Visit: Payer: Self-pay

## 2024-12-10 ENCOUNTER — Ambulatory Visit: Admitting: Urology

## 2024-12-18 ENCOUNTER — Ambulatory Visit: Admitting: Urology

## 2024-12-19 ENCOUNTER — Other Ambulatory Visit: Payer: Self-pay | Admitting: Pharmacist

## 2024-12-19 ENCOUNTER — Other Ambulatory Visit: Payer: Self-pay

## 2024-12-19 MED ORDER — UPADACITINIB ER 30 MG PO TB24
30.0000 mg | ORAL_TABLET | Freq: Every day | ORAL | 3 refills | Status: AC
  Filled 2024-12-19: qty 30, 30d supply, fill #0
  Filled 2024-12-19: qty 30, fill #0
  Filled 2025-01-18: qty 30, 30d supply, fill #1

## 2024-12-19 NOTE — Progress Notes (Signed)
 Specialty Pharmacy Ongoing Clinical Assessment Note  Christy Summers is a 73 y.o. female who is being followed by the specialty pharmacy service for RxSp Crohn's Disease   Patient's specialty medication(s) reviewed today: Upadacitinib    Missed doses in the last 4 weeks: 0   Patient/Caregiver did not have any additional questions or concerns.   Therapeutic benefit summary: Patient is achieving benefit   Adverse events/side effects summary: No adverse events/side effects   Patient's therapy is appropriate to: Continue    Goals Addressed             This Visit's Progress    Minimize recurrence of flares   On track    Patient is on track. Patient will maintain adherence. [name] reports that she is well-controlled at this time.         Follow up: 12 months  Lyle LELON Chalk Specialty Pharmacist

## 2024-12-19 NOTE — Progress Notes (Signed)
 Specialty Pharmacy Refill Coordination Note  Christy Summers is a 73 y.o. female contacted today regarding refills of specialty medication(s) Upadacitinib    Patient requested Delivery   Delivery date: 12/28/24   Verified address: [address]  [place], [place] [address]   Medication will be filled on: 12/27/24

## 2024-12-24 ENCOUNTER — Encounter: Payer: Self-pay | Admitting: Urology

## 2024-12-24 ENCOUNTER — Ambulatory Visit: Admitting: Urology

## 2024-12-24 VITALS — BP 170/88 | HR 85 | Ht 68.5 in | Wt 230.0 lb

## 2024-12-24 DIAGNOSIS — N2889 Other specified disorders of kidney and ureter: Secondary | ICD-10-CM

## 2024-12-24 NOTE — Progress Notes (Signed)
 "  12/24/2024 4:19 PM   Christy Summers 11-10-1951 [id number]  Referring provider: Sadie Manna, MD [address] [facility] [place],  [place] [address]  Chief Complaint  Patient presents with   Follow-up   Urologic History 1. Microhematuria. -Seen June 2024 for high-risk microhematuria (32 RBCs)  -CT 05/2023 showed a 12 mm heterogeneous increased density lesion in the upper pole of the left kidney with a suggestion of enhancement. -Cystoscopy 06/2023 showed no lower-tract abnormalities.   2.  Right renal mass -Follow-up MRI October 10/10/23 showed a 1.4 x 1.4 cm heterogeneously enhancing right renal mass appeared there were multiple benign Bosniak 1 cortical cysts, as well as a Bosniak 2 posterior superior pole left renal cyst.     HPI: Christy Summers is a 74 y.o. female who presents for renal mass follow-up.  At last visit May 2025 she had slight interval change right renal mass from 1.4 x 1.4 cm to 1.6 x 1.4 cm She had a an MR abdomen MRCP with/without contrast 07/27/2024 and the mass was measured at 1.6 x 1.7 cm Denies flank, abdominal, pelvic pain No bothersome LUTS or gross hematuria   PMH: Past Medical History:  Diagnosis Date   Anemia    normocytic   Bilateral ovarian cysts    only right ovarian cyst   Cancer (HCC)    right kidney mass md watching stage   Chronic diarrhea    Chronic ulcerative colitis, unspecified complication (HCC)    Crohn's disease (HCC)    Elevated lipids    History of Clostridioides difficile colitis 04/2020   pt. has been positive and negative since 2017. most recent result was negative. diarrhea persists   Hypertension    Neuromuscular disorder (HCC)    PSC (primary sclerosing cholangitis) (HCC)     Surgical History: Past Surgical History:  Procedure Laterality Date   ABDOMINAL HYSTERECTOMY     CHOLECYSTECTOMY     COLONOSCOPY WITH PROPOFOL  N/A 09/07/2017   Procedure: COLONOSCOPY WITH PROPOFOL ;  Surgeon:  Toledo, Ladell POUR, MD;  Location: [facility] ENDOSCOPY;  Service: Endoscopy;  Laterality: N/A;   COLONOSCOPY WITH PROPOFOL  N/A 01/22/2019   Procedure: COLONOSCOPY WITH PROPOFOL ;  Surgeon: Viktoria Lamar DASEN, MD;  Location: [facility] ENDOSCOPY;  Service: Endoscopy;  Laterality: N/A;   COLONOSCOPY WITH PROPOFOL  N/A 09/21/2021   Procedure: COLONOSCOPY WITH PROPOFOL ;  Surgeon: Maryruth Ole DASEN, MD;  Location: [facility] ENDOSCOPY;  Service: Endoscopy;  Laterality: N/A;   COLONOSCOPY WITH PROPOFOL  N/A 03/29/2023   Procedure: COLONOSCOPY WITH PROPOFOL ;  Surgeon: Maryruth Ole DASEN, MD;  Location: [facility] ENDOSCOPY;  Service: Endoscopy;  Laterality: N/A;   COLONOSCOPY WITH PROPOFOL  N/A 04/24/2024   Procedure: COLONOSCOPY WITH PROPOFOL ;  Surgeon: Maryruth Ole DASEN, MD;  Location: [facility] ENDOSCOPY;  Service: Endoscopy;  Laterality: N/A;   ERCP W/ SPHINCTEROTOMY AND BALLOON DILATION N/A    ESOPHAGOGASTRODUODENOSCOPY (EGD) WITH PROPOFOL  N/A 09/07/2017   Procedure: ESOPHAGOGASTRODUODENOSCOPY (EGD) WITH PROPOFOL ;  Surgeon: Toledo, Ladell POUR, MD;  Location: [facility] ENDOSCOPY;  Service: Endoscopy;  Laterality: N/A;   POLYPECTOMY  04/24/2024   Procedure: POLYPECTOMY, INTESTINE;  Surgeon: Maryruth Ole DASEN, MD;  Location: [facility] ENDOSCOPY;  Service: Endoscopy;;   TOTAL LAPAROSCOPIC HYSTERECTOMY WITH BILATERAL SALPINGO OOPHORECTOMY N/A 04/30/2020   Procedure: HYSTERECTOMY TOTAL LAPAROSCOPIC/BILATERAL SALPINGO OOPHORECTOMY POSSIBLE PELVIC/AORTIC LYMPH NODE DISSECTION, OMENTECTOMY, LAPAROTOMY;  Surgeon: Mancil Barter, MD;  Location: [facility] ORS;  Service: Gynecology;  Laterality: N/A;    Home Medications:  Allergies as of 12/24/2024       Reactions  Mesalamine Diarrhea, Nausea Only        Medication List        Accurate as of December 24, 2024  4:19 PM. If you have any questions, ask your nurse or doctor.          amLODipine  10 MG tablet Commonly known as: NORVASC  Take 1 tablet (10 mg total) by mouth daily.   aspirin  EC  81 MG tablet Take 81 mg by mouth daily.   Cholecalciferol 25 MCG (1000 UT) tablet Take 1,000 Units by mouth daily.   cyanocobalamin  1000 MCG tablet Take 1,000 mcg by mouth daily.   Rinvoq  15 MG Tb24 Generic drug: Upadacitinib  ER Take 15 mg by mouth once daily as needed   Upadacitinib  ER 30 MG Tb24 Commonly known as: Rinvoq  Take 1 tablet (30 mg total) by mouth once daily   Tums Ultra 1000 1000 MG chewable tablet Generic drug: calcium elemental as carbonate Chew 1,000 mg by mouth daily.   Vitamin C 500 MG Caps Take 500 mg by mouth daily.        Allergies: Allergies[1]  Family History: Family History  Problem Relation Age of Onset   Hypertension Mother    Stroke Mother    Stroke Sister    Hypertension Sister    Hypertension Brother    Breast cancer Neg Hx     Social History:  reports that she has never smoked. She has never used smokeless tobacco. She reports that she does not currently use alcohol . She reports that she does not currently use drugs.   Physical Exam: BP (!) 170/88   Pulse 85   Ht 5' 8.5 (1.74 m)   Wt 230 lb (104.3 kg)   BMI 34.46 kg/m   Constitutional:  Alert, No acute distress. HEENT: [place] AT Respiratory: Normal respiratory effort, no increased work of breathing. Psychiatric: Normal mood and affect.   Pertinent Imaging:  MRI images were personally reviewed and interpreted    Assessment & Plan:    1.  Right renal mass Continue slight interval growth.  She would prefer treatment over surveillance and is primarily interested in percutaneous ablation.  Will schedule IR consultation to see if lesion amenable.    Glendia JAYSON Barba, MD  [facility] [address] [place], [place] [address] [phone]     [1]  Allergies Allergen Reactions   Mesalamine Diarrhea and Nausea Only   "

## 2024-12-25 ENCOUNTER — Other Ambulatory Visit: Payer: Self-pay

## 2024-12-25 ENCOUNTER — Inpatient Hospital Stay: Admission: RE | Admit: 2024-12-25 | Source: Ambulatory Visit

## 2024-12-25 DIAGNOSIS — N2889 Other specified disorders of kidney and ureter: Secondary | ICD-10-CM

## 2024-12-25 NOTE — Progress Notes (Signed)
 Referral sent to IR for consult of Percutaneous Ablation of Renal Mass.

## 2024-12-25 NOTE — Progress Notes (Signed)
 Chief Complaint  Patient presents with   Follow-up    HPI  Christy Summers is a 74 y.o. 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 Redemonstration of approximately 1.6 x 1.7 cm enhancing lesion  arising from the right kidney upper pole, posteriorly, compatible  with renal cell carcinoma. There is minimal interval growth since  the prior exam. No evidence of metastatic disease within the  abdomen.  2. Multiple other nonenhancing lesions in the left kidney, favored  to represent proteinaceous/hemorrhagic cysts.  3. Moderate diffuse hepatic steatosis.  4. Multiple other nonacute observations, as described above.   Rinvoq  has helped with Crohn's colitis  Denies abdominal pain  Denies  Chest pains or  Shortness of breath. Non Smoker. Occasional alcohol . Retired from [employer] in May 2015 Recent labs:Hgb; 12.3  ,Sugar;92   Se Creat;0.7  A1c; 5.8 Alk Phos; 179 , TSH: 1,296 Total Cholesterol: 239, Triglycerides; 123 Vit D ; 32.0  Se Ferritin 44    ROS Rest of 10 point review of systems is normal.  Outpatient Encounter Medications as of 12/25/2024  Medication Sig Dispense Refill   amLODIPine  (NORVASC ) 10 MG tablet Take 1 tablet (10 mg total) by mouth daily. 90 tablet 1   ascorbic acid, vitamin C, (VITAMIN C) 500 MG tablet Take 500 mg by mouth once daily     aspirin  81 MG EC tablet Take 81 mg by mouth once daily.     calcium carbonate (TUMS ORAL) Take 1 tablet by mouth once daily     cholecalciferol (VITAMIN D3) 1000 unit tablet Take 1,000 Units by mouth once daily.     cyanocobalamin  (VITAMIN B12) 1000 MCG tablet Take 1,000 mcg by mouth once daily     RINVOQ  30 mg extended release tablet Take 1 tablet (30 mg total) by mouth once daily 30 tablet 3   upadacitinib  (RINVOQ ) 15 mg extended release tablet Take 1 tablet (15 mg total) by  mouth once daily (Patient not taking: Reported on 07/31/2024) 30 tablet 3   No facility-administered encounter medications on file as of 12/25/2024.    Allergies as of 12/25/2024 - Reviewed 12/25/2024  Allergen Reaction Noted   Lialda [mesalamine] Diarrhea and Nausea 02/26/2020    Past Medical History:  Diagnosis Date   Anemia    Hyperlipidemia    Hypertension    Recurrent Clostridium difficile diarrhea     Past Surgical History:  Procedure Laterality Date   COLONOSCOPY  09/07/2017   Severe active colitis/Active enteritis/No Repeat/TKT   EGD  09/07/2017   Chronic gastritis/No Repeat/TKT   COLONOSCOPY  01/22/2019   Chronic colitis: CBF 01/2022   HYSTERECTOMY TOTAL ABDOMINAL W/REMOVAL TUBES &/OR OVARIES  04/2020   COLONOSCOPY  08/07/2020   Colitis/Proctitis/Ileitis/Repeat 11yrs/CTL   ENDOSCOPIC RETROGRADE CHOLANGIO-PANCREATOGRAPHY W/EXTRACTION STONE N/A 09/09/2020   Procedure: ENDOSCOPIC RETROGRADE CHOLANGIOPANCREATOGRAPHY (ERCP); WITH REMOVAL OF CALCULI/DEBRIS FROM BILIARY/PANCREATIC DUCT(S);  Surgeon: Elta Fonda Mt, MD;  Location: [facility];  Service: Gastroenterology;  Laterality: N/A;   ENDOSCOPIC RETROGRADE CHOLANGIO-PANCREATOGRAPHY W/DILATION N/A 09/09/2020   Procedure: ERCP; WITH TRANS-ENDOSCOPIC BALLOON DILATION OF BILIARY / PANCREATIC DUCT(S) OR OF AMPULLA (SPHINCTEROPLASTY), INCLUDING SPHINCTEROTOMY, WHEN PERFORMED, EACH DUCT;  Surgeon: Elta Fonda Mt, MD;  Location: [facility];  Service: Gastroenterology;  Laterality: N/A;  with sphincterotomy   ENDOSCOPY OF BILIARY DUCT  09/09/2020  Procedure: ENDOSCOPIC CATHETERIZATION OF THE BILIARY DUCTAL SYSTEM, RADIOLOGICAL SUPERVISION AND INTERPRETATION;  Surgeon: Elta Fonda Mt, MD;  Location: [facility];  Service: Gastroenterology;;   COLONOSCOPY  09/21/2021   Colitis/Ileitis/Repeat to be determind/CTL   Colon @ [facility]  03/29/2023   Normal TI, mild colitis, and  pseudopolyps. Will need repeat colonoscopy in 1 year. Due to [facility] and IBD/CTL   Colon @ [facility]  04/24/2024   Colon path with inflammatory polyps and mildly active disease/Repeat colonoscopy in 1 year/CTL   LAPAROSCOPIC CHOLECYSTECTOMY     UPPER GASTROINTESTINAL ENDOSCOPY        Vitals:   12/25/24 0943  BP: 122/72  Pulse: 92   Wt Readings from Last 3 Encounters:  12/25/24 (!) 107 kg (236 lb)  07/31/24 (!) 100.8 kg (222 lb 3.2 oz)  05/08/24 97.5 kg (215 lb)      Exam Wt Readings from Last 3 Encounters:  12/25/24 (!) 107 kg (236 lb)  07/31/24 (!) 100.8 kg (222 lb 3.2 oz)  05/08/24 97.5 kg (215 lb)    Blood pressure 122/72, pulse 92, height 167.6 cm (5' 6), weight (!) 107 kg (236 lb), SpO2 98%.  Body mass index is 38.09 kg/m. Wt Readings from Last 3 Encounters:  12/25/24 (!) 107 kg (236 lb)  07/31/24 (!) 100.8 kg (222 lb 3.2 oz)  05/08/24 97.5 kg (215 lb)  Body mass index is 38.09 kg/m.  General: Alert oriented x3  NAD Eyes: Sclera and conjunctiva clear; pupils equal round and reactive to light  extraocular movements intact Ears: External ears and canal normal; tympanic membranes normal.  Wart like lesion noted left ear  Nose: Mucosa healthy without drainage or ulceration Oropharynx: No suspicious lesions Neck: No swelling, masses, stiffness, pain, limited movement, carotid pulses normal bilaterally, thyroid  normal size, no masses palpated. No bruits heard. Lungs: Respirations unlabored; clear to auscultation bilaterally Back: No spinal deformity Cardiovascular: Heart regular rate and rhythm 3/6 SEM Noted  Abdomen: Soft; non tender; non distended;  no masses or organomegaly  PELVIC: Declined Lymph Nodes: No significant cervical or supraclavicular lymphadenopathy noted Musculoskeletal: no active joint inflammation Extremities: Normal, no edema Neurologic: Alert and oriented; speech intact; face symmetrical; moves all extremities well     Assessment and Plan: 1  Rt renal lesion; MRI in April 2025 showed minimal enlargement  Will have repeat MRI in Dec 2025  Follow up with Dr. Twylla  2 Crohn's colitis and  Primary Sclerosing Cholangitis On Rinvoq - Sees Dr. Maryruth  S/p ERCP and Sphincterotomy Colonoscopy- April 2024  3 HTN; On Amlodipine  10  mg po q d 4 Hyperlipidemia;No longer on a stain  5 Anemia: Hgb  Is 11.8 -Continue iron  6 Low Vit d-/ Elevated Alkaline Phosphatase;  On Supplements 7 Systolic heart murmur: ECHO June 2025; NORMAL LEFT VENTRICULAR SYSTOLIC FUNCTION WITH NO LVH ESTIMATED EF: >55% NORMAL LA PRESSURES WITH DIASTOLIC DYSFUNCTION (GRADE 1) NORMAL RIGHT VENTRICULAR SYSTOLIC FUNCTION VALVULAR REGURGITATION: TRIVIAL AR, TRIVIAL MR, TRIVIAL PR, TRIVIAL TR NO VALVULAR STENOSIS   8 Hx of Rt Ovarian cyst: S/P  TAH/ BSO  9 Elevated A1c; (5.8) ; Discussed low carb diet  10 Health maintenance: Mammogram -ok July 2025 No need for Pap because of Hysterectomy  Discussed results of labs  Colonoscopy: April 2024 ; 4 polyps, Inflammation of rectum and Internal hemorrhoids  Repeated in May 2025; Aphthous ulcers, 3 polyps  Up to date with Flu shot Pneumovax, Inj Tdap  and COVID 19 vaccine  Check cbc, met-c , B12,  se Ferritin 1 week prior to next visit  Follow up in 6 months     Tamra Leventhal  MD

## 2024-12-27 ENCOUNTER — Ambulatory Visit
Admission: RE | Admit: 2024-12-27 | Discharge: 2024-12-27 | Disposition: A | Discharge status: Home / Self Care | Source: Ambulatory Visit | Attending: Urology | Admitting: Urology

## 2024-12-27 DIAGNOSIS — N2889 Other specified disorders of kidney and ureter: Secondary | ICD-10-CM

## 2024-12-27 HISTORY — PX: IR RADIOLOGIST EVAL & MGMT: IMG5224

## 2024-12-27 NOTE — Consult Note (Signed)
 "      Chief Complaint: Patient was seen in consultation today for right renal mass at the request of Stoioff,Scott C  Referring Physician(s): Stoioff,Scott C  Supervising Physician: Karalee Beat  History of Present Illness: Christy Summers is a 74 y.o. female with past medical history significant for Crohn's disease, ulcerative colitis, primary sclerosing cholangitis, anemia, HTN and right renal mass who presents today to discuss microwave ablation of right renal mass. Christy Summers was referred to urology in 2024 for evaluation of microhematuria and underwent CT urogram which showed:  1. Subtle 12 mm heterogeneous mildly increased density lesion in the upper pole left kidney. Imaging after IV contrast administration suggests that there may be some heterogeneous enhancement in this lesion raising the question of neoplasm. MRI of the abdomen without and with contrast recommended to further evaluate. 2. Subtle minimally hypoattenuating lesion in the subcortical upper pole left kidney has attenuation slightly higher than background renal parenchyma on precontrast imaging. This may be a complex cyst but attention on follow-up recommended. 3. No other findings to explain the patient's history of hematuria. 4.  Aortic Atherosclerosis (ICD10-I70.0).  Follow up MRI abdomen w/ and w/o contrast showed:  1. Small heterogeneously enhancing mass of the posterior superior pole of the right kidney measuring 1.4 x 1.4 cm. In retrospective review, this was first seen as a subcentimeter lesion on prior examination dated 08/27/2022 and has enlarged in the interval. This is consistent with a small, slowly enlarging renal cell carcinoma. 2. No evidence of renal vein invasion, lymphadenopathy, or metastatic disease in the abdomen. 3. Hepatic steatosis. 4. Status post cholecystectomy  She has been followed regularly by urology and was noted to have an increase in size of the right renal mass on  more recent follow up. She has been referred to IR for possible microwave ablation of right renal mass.  Past Medical History:  Diagnosis Date   Anemia    normocytic   Bilateral ovarian cysts    only right ovarian cyst   Cancer (HCC)    right kidney mass md watching stage   Chronic diarrhea    Chronic ulcerative colitis, unspecified complication (HCC)    Crohn's disease (HCC)    Elevated lipids    History of Clostridioides difficile colitis 04/2020   pt. has been positive and negative since 2017. most recent result was negative. diarrhea persists   Hypertension    Neuromuscular disorder (HCC)    PSC (primary sclerosing cholangitis) (HCC)     Past Surgical History:  Procedure Laterality Date   ABDOMINAL HYSTERECTOMY     CHOLECYSTECTOMY     COLONOSCOPY WITH PROPOFOL  N/A 09/07/2017   Procedure: COLONOSCOPY WITH PROPOFOL ;  Surgeon: Toledo, Ladell POUR, MD;  Location: [facility];  Service: Endoscopy;  Laterality: N/A;   COLONOSCOPY WITH PROPOFOL  N/A 01/22/2019   Procedure: COLONOSCOPY WITH PROPOFOL ;  Surgeon: Viktoria Lamar DASEN, MD;  Location: [facility];  Service: Endoscopy;  Laterality: N/A;   COLONOSCOPY WITH PROPOFOL  N/A 09/21/2021   Procedure: COLONOSCOPY WITH PROPOFOL ;  Surgeon: Maryruth Ole DASEN, MD;  Location: [facility];  Service: Endoscopy;  Laterality: N/A;   COLONOSCOPY WITH PROPOFOL  N/A 03/29/2023   Procedure: COLONOSCOPY WITH PROPOFOL ;  Surgeon: Maryruth Ole DASEN, MD;  Location: [facility];  Service: Endoscopy;  Laterality: N/A;   COLONOSCOPY WITH PROPOFOL  N/A 04/24/2024   Procedure: COLONOSCOPY WITH PROPOFOL ;  Surgeon: Maryruth Ole DASEN, MD;  Location: [facility];  Service: Endoscopy;  Laterality: N/A;   ERCP W/ SPHINCTEROTOMY AND BALLOON  DILATION N/A    ESOPHAGOGASTRODUODENOSCOPY (EGD) WITH PROPOFOL  N/A 09/07/2017   Procedure: ESOPHAGOGASTRODUODENOSCOPY (EGD) WITH PROPOFOL ;  Surgeon: Toledo, Ladell POUR, MD;  Location: [facility];  Service:  Endoscopy;  Laterality: N/A;   POLYPECTOMY  04/24/2024   Procedure: POLYPECTOMY, INTESTINE;  Surgeon: Maryruth Ole DASEN, MD;  Location: [facility];  Service: Endoscopy;;   TOTAL LAPAROSCOPIC HYSTERECTOMY WITH BILATERAL SALPINGO OOPHORECTOMY N/A 04/30/2020   Procedure: HYSTERECTOMY TOTAL LAPAROSCOPIC/BILATERAL SALPINGO OOPHORECTOMY POSSIBLE PELVIC/AORTIC LYMPH NODE DISSECTION, OMENTECTOMY, LAPAROTOMY;  Surgeon: Mancil Barter, MD;  Location: [facility];  Service: Gynecology;  Laterality: N/A;    Allergies: Mesalamine  Medications: Prior to Admission medications  Medication Sig Start Date End Date Taking? Authorizing Provider  amLODipine  (NORVASC ) 10 MG tablet Take 1 tablet (10 mg total) by mouth daily. 10/19/24     Ascorbic Acid (VITAMIN C) 500 MG CAPS Take 500 mg by mouth daily.     [provider]  aspirin  81 MG EC tablet Take 81 mg by mouth daily.     [provider]  calcium elemental as carbonate (TUMS ULTRA 1000) 400 MG chewable tablet Chew 1,000 mg by mouth daily.    [provider]  Cholecalciferol 25 MCG (1000 UT) tablet Take 1,000 Units by mouth daily.     [provider]  cyanocobalamin  1000 MCG tablet Take 1,000 mcg by mouth daily.    [provider]  Upadacitinib  ER (RINVOQ ) 15 MG TB24 Take 15 mg by mouth once daily as needed 09/19/23     Upadacitinib  ER (RINVOQ ) 30 MG TB24 Take 1 tablet (30 mg total) by mouth once daily 12/19/24        Family History  Problem Relation Age of Onset   Hypertension Mother    Stroke Mother    Stroke Sister    Hypertension Sister    Hypertension Brother    Breast cancer Neg Hx     Social History   Socioeconomic History   Marital status: Divorced    Spouse name: Not on file   Number of children: Not on file   Years of education: Not on file   Highest education level: Not on file  Occupational History   Occupation: [employer]    Comment: retired  Tobacco Use   Smoking status: Never    Smokeless tobacco: Never  Vaping Use   Vaping status: Never Used  Substance and Sexual Activity   Alcohol  use: Not Currently    Comment: rare sweet drink   Drug use: Not Currently   Sexual activity: Not Currently  Other Topics Concern   Not on file  Social History Narrative   She is retired from [employer].    Patient lives alone. Son to help out after surgery   Social Drivers of Health   Tobacco Use: Low Risk  (12/25/2024)   Received from [facility]   Patient History    Smoking Tobacco Use: Never    Smokeless Tobacco Use: Never    Passive Exposure: Not on file  Financial Resource Strain: Medium Risk (05/05/2024)   Received from [facility]   Overall Financial Resource Strain (CARDIA)    Difficulty of Paying Living Expenses: Somewhat hard  Food Insecurity: Food Insecurity Present (05/05/2024)   Received from [facility]   Epic    Within the past 12 months, you worried that your food would run out before you got the money to buy more.: Sometimes true    Within the past 12  months, the food you bought just didn't last and you didn't have money to get more.: Sometimes true  Transportation Needs: No Transportation Needs (05/05/2024)   Received from [facility] - [facility]    In the past 12 months, has lack of transportation kept you from medical appointments or from getting medications?: No    Lack of Transportation (Non-Medical): No  Physical Activity: Not on file  Stress: Not on file  Social Connections: Not on file  Depression (EYV7-0): Not on file  Alcohol  Screen: Not on file  Housing: Low Risk  (11/28/2024)   Received from [facility]   Epic    In the last 12 months, was there a time when you were not able to pay the mortgage or rent on time?: No    In the past 12 months, how many times have you moved where you were living?: 0    At any time in the past 12 months, were you  homeless or living in a shelter (including now)?: No  Utilities: Not At Risk (05/05/2024)   Received from [facility] Utilities    Threatened with loss of utilities: No  Health Literacy: Not on file    ECOG Status: 0 - Asymptomatic  Review of Systems: A 12 point ROS discussed and pertinent positives are indicated in the HPI above.  All other systems are negative.  Review of Systems  Vital Signs: There were no vitals taken for this visit.  Physical Exam Constitutional:      General: She is not in acute distress.    Appearance: She is obese.  HENT:     Head: Normocephalic and atraumatic.  Eyes:     General: No scleral icterus. Cardiovascular:     Rate and Rhythm: Normal rate.  Pulmonary:     Effort: Pulmonary effort is normal.  Abdominal:     General: There is no distension.     Tenderness: There is no abdominal tenderness.  Skin:    General: Skin is warm and dry.  Neurological:     Mental Status: She is alert and oriented to person, place, and time.  Psychiatric:        Mood and Affect: Mood normal.        Behavior: Behavior normal.      Imaging: No results found.  Labs:  CBC: No results for input(s): WBC, HGB, HCT, PLT in the last 8760 hours.  COAGS: No results for input(s): INR, APTT in the last 8760 hours.  BMP: No results for input(s): NA, K, CL, CO2, GLUCOSE, BUN, CALCIUM, CREATININE, GFRNONAA, GFRAA in the last 8760 hours.  Invalid input(s): CMP  LIVER FUNCTION TESTS: No results for input(s): BILITOT, AST, ALT, ALKPHOS, PROT, ALBUMIN in the last 8760 hours.  TUMOR MARKERS: No results for input(s): AFPTM, CEA, CA199, CHROMGRNA in the last 8760 hours.  Assessment:  74 y/o F wit history of right renal mass first noted in 2024 with recent interval increase in size who presents today to discuss possible right renal microwave ablation.  The lesion is question is quite small  at 1.6 cm and is not exophytic which renders it nearly invisible on non contrast enhanced imaging.  Additionally, the lesion is at the upper pole of the right kidney adjacent to the liver and lung.  These factors make both surgical and ablation therapies more challenging.   After discussing the risks and benefits we decided together to pursue  another MRI to assess for continued growth versus stability.  We will then meet again and decide how to proceed.   1.) MRI in late February followed by a repeat clinic visit.     Electronically Signed: Clotilda DELENA Hesselbach PA-C 12/27/2024, 8:36 AM   I spent a total of 60 Minutes  n face to face in clinical consultation, greater than 50% of which was counseling/coordinating care for right renal mass.   "

## 2024-12-28 ENCOUNTER — Other Ambulatory Visit: Payer: Self-pay

## 2025-01-02 ENCOUNTER — Other Ambulatory Visit: Payer: Self-pay | Admitting: Interventional Radiology

## 2025-01-02 DIAGNOSIS — N2889 Other specified disorders of kidney and ureter: Secondary | ICD-10-CM

## 2025-01-18 ENCOUNTER — Other Ambulatory Visit: Payer: Self-pay

## 2025-01-21 ENCOUNTER — Other Ambulatory Visit (HOSPITAL_COMMUNITY): Payer: Self-pay

## 2025-01-24 ENCOUNTER — Other Ambulatory Visit: Payer: Self-pay | Admitting: Interventional Radiology

## 2025-01-24 DIAGNOSIS — N2889 Other specified disorders of kidney and ureter: Secondary | ICD-10-CM

## 2025-02-14 ENCOUNTER — Other Ambulatory Visit

## 2025-02-21 ENCOUNTER — Other Ambulatory Visit
